# Patient Record
Sex: Female | Born: 1970
Health system: Southern US, Community
[De-identification: ages and names within clinical notes are randomized; demographics above are authoritative.]

## PROBLEM LIST (undated history)

## (undated) DIAGNOSIS — F200 Paranoid schizophrenia: Secondary | ICD-10-CM

## (undated) DIAGNOSIS — F329 Major depressive disorder, single episode, unspecified: Secondary | ICD-10-CM

## (undated) DIAGNOSIS — F32A Depression, unspecified: Secondary | ICD-10-CM

## (undated) DIAGNOSIS — D649 Anemia, unspecified: Secondary | ICD-10-CM

## (undated) DIAGNOSIS — I10 Essential (primary) hypertension: Secondary | ICD-10-CM

## (undated) DIAGNOSIS — F319 Bipolar disorder, unspecified: Secondary | ICD-10-CM

## (undated) DIAGNOSIS — F419 Anxiety disorder, unspecified: Secondary | ICD-10-CM

## (undated) HISTORY — PX: WISDOM TOOTH EXTRACTION: SHX21

## (undated) HISTORY — DX: Depression, unspecified: F32.A

## (undated) HISTORY — DX: Paranoid schizophrenia: F20.0

## (undated) HISTORY — DX: Anxiety disorder, unspecified: F41.9

## (undated) HISTORY — DX: Major depressive disorder, single episode, unspecified: F32.9

---

## 1993-01-13 HISTORY — PX: DILATION AND CURETTAGE OF UTERUS: SHX78

## 2006-08-28 ENCOUNTER — Emergency Department (HOSPITAL_COMMUNITY): Admission: EM | Admit: 2006-08-28 | Discharge: 2006-08-28 | Payer: Self-pay | Admitting: Emergency Medicine

## 2007-04-12 ENCOUNTER — Emergency Department (HOSPITAL_COMMUNITY): Admission: EM | Admit: 2007-04-12 | Discharge: 2007-04-12 | Payer: Self-pay | Admitting: Emergency Medicine

## 2009-07-08 ENCOUNTER — Emergency Department (HOSPITAL_COMMUNITY): Admission: EM | Admit: 2009-07-08 | Discharge: 2009-07-08 | Payer: Self-pay | Admitting: Emergency Medicine

## 2009-07-13 ENCOUNTER — Inpatient Hospital Stay (HOSPITAL_COMMUNITY): Admission: RE | Admit: 2009-07-13 | Discharge: 2009-07-19 | Payer: Self-pay | Admitting: Psychiatry

## 2009-07-13 ENCOUNTER — Ambulatory Visit: Payer: Self-pay | Admitting: Psychiatry

## 2010-03-31 LAB — CBC
Hemoglobin: 13.2 g/dL (ref 12.0–15.0)
MCHC: 34.6 g/dL (ref 30.0–36.0)
RBC: 4.11 MIL/uL (ref 3.87–5.11)
WBC: 5.7 10*3/uL (ref 4.0–10.5)

## 2010-03-31 LAB — DIFFERENTIAL
Basophils Relative: 0 % (ref 0–1)
Eosinophils Absolute: 0 10*3/uL (ref 0.0–0.7)
Lymphocytes Relative: 27 % (ref 12–46)
Lymphs Abs: 1.5 10*3/uL (ref 0.7–4.0)
Monocytes Absolute: 0.5 10*3/uL (ref 0.1–1.0)
Neutro Abs: 3.7 10*3/uL (ref 1.7–7.7)
Neutrophils Relative %: 64 % (ref 43–77)

## 2010-03-31 LAB — POCT PREGNANCY, URINE: Preg Test, Ur: NEGATIVE

## 2010-03-31 LAB — D-DIMER, QUANTITATIVE: D-Dimer, Quant: <0.22 ug/mL-FEU (ref 0.00–0.48)

## 2010-03-31 LAB — POCT CARDIAC MARKERS
CKMB, poc: <1 ng/mL — ABNORMAL LOW (ref 1.0–8.0)
Myoglobin, poc: 22.9 ng/mL (ref 12–200)

## 2010-03-31 LAB — VALPROIC ACID LEVEL: Valproic Acid Lvl: 76.4 ug/mL (ref 50.0–100.0)

## 2010-10-07 LAB — BASIC METABOLIC PANEL
BUN: 3 — ABNORMAL LOW
Calcium: 9.3
Glucose, Bld: 105 — ABNORMAL HIGH
Sodium: 138

## 2010-10-07 LAB — DIFFERENTIAL
Basophils Relative: 1
Lymphs Abs: 1.3
Monocytes Absolute: 0.3
Neutro Abs: 4.5

## 2010-10-07 LAB — CBC
RBC: 4.13
RDW: 13.7

## 2010-10-25 LAB — COMPREHENSIVE METABOLIC PANEL
Albumin: 3.6
CO2: 28
Calcium: 8.9
Chloride: 104
Glucose, Bld: 93
Potassium: 3.7
Sodium: 137
Total Bilirubin: 0.6
Total Protein: 6.6

## 2010-10-25 LAB — DIFFERENTIAL
Basophils Relative: 1
Eosinophils Absolute: 0
Eosinophils Relative: 1
Lymphocytes Relative: 24
Lymphs Abs: 1.6
Monocytes Absolute: 0.6
Neutrophils Relative %: 65

## 2010-10-25 LAB — POCT CARDIAC MARKERS
CKMB, poc: <1 — ABNORMAL LOW
Myoglobin, poc: 46.7
Troponin i, poc: <0.05

## 2010-10-25 LAB — URINALYSIS, ROUTINE W REFLEX MICROSCOPIC
Bilirubin Urine: NEGATIVE
Hgb urine dipstick: NEGATIVE
Ketones, ur: NEGATIVE
Nitrite: NEGATIVE
Protein, ur: NEGATIVE
Urobilinogen, UA: 0.2

## 2010-10-25 LAB — CBC
MCHC: 34.2
RBC: 3.91

## 2010-10-25 LAB — POCT PREGNANCY, URINE: Preg Test, Ur: NEGATIVE

## 2011-03-17 ENCOUNTER — Ambulatory Visit (INDEPENDENT_AMBULATORY_CARE_PROVIDER_SITE_OTHER): Payer: Medicare Other | Admitting: Internal Medicine

## 2011-03-17 VITALS — BP 137/82 | HR 83 | Temp 98.7°F | Resp 16 | Ht 65.75 in | Wt 147.6 lb

## 2011-03-17 DIAGNOSIS — Z1231 Encounter for screening mammogram for malignant neoplasm of breast: Secondary | ICD-10-CM

## 2011-03-17 DIAGNOSIS — Z202 Contact with and (suspected) exposure to infections with a predominantly sexual mode of transmission: Secondary | ICD-10-CM

## 2011-03-17 DIAGNOSIS — Z0000 Encounter for general adult medical examination without abnormal findings: Secondary | ICD-10-CM | POA: Insufficient documentation

## 2011-03-17 DIAGNOSIS — F329 Major depressive disorder, single episode, unspecified: Secondary | ICD-10-CM

## 2011-03-17 DIAGNOSIS — F32A Depression, unspecified: Secondary | ICD-10-CM

## 2011-03-17 DIAGNOSIS — Z79899 Other long term (current) drug therapy: Secondary | ICD-10-CM

## 2011-03-17 DIAGNOSIS — IMO0001 Reserved for inherently not codable concepts without codable children: Secondary | ICD-10-CM

## 2011-03-17 DIAGNOSIS — Z7251 High risk heterosexual behavior: Secondary | ICD-10-CM

## 2011-03-17 LAB — COMPREHENSIVE METABOLIC PANEL
BUN: 8 mg/dL (ref 6–23)
CO2: 24 mEq/L (ref 19–32)
Calcium: 9.4 mg/dL (ref 8.4–10.5)
Chloride: 107 mEq/L (ref 96–112)
Creat: 0.81 mg/dL (ref 0.50–1.10)
Glucose, Bld: 75 mg/dL (ref 70–99)
Total Bilirubin: 0.4 mg/dL (ref 0.3–1.2)

## 2011-03-17 LAB — POCT URINALYSIS DIPSTICK
Glucose, UA: NEGATIVE
Urobilinogen, UA: 0.2

## 2011-03-17 LAB — TSH: TSH: 2.009 u[IU]/mL (ref 0.350–4.500)

## 2011-03-17 LAB — POCT CBC
HCT, POC: 37.3 % — AB (ref 37.7–47.9)
MCHC: 31.6 g/dL — AB (ref 31.8–35.4)
RBC: 4.13 M/uL (ref 4.04–5.48)
WBC: 5.5 10*3/uL (ref 4.6–10.2)

## 2011-03-17 LAB — POCT URINE PREGNANCY: Preg Test, Ur: NEGATIVE

## 2011-03-17 MED ORDER — NORELGESTROMIN-ETH ESTRADIOL 150-35 MCG/24HR TD PTWK: 1.0000 | MEDICATED_PATCH | TRANSDERMAL | Status: DC

## 2011-03-17 NOTE — Progress Notes (Signed)
  Subjective:    Patient ID: Kristina Johnston, female    DOB: [birth year], 41 y.o.   MRN: [id number]  HPI   See scanned hx form Review of Systems See scanned hx form    Objective:   Physical Exam  Vitals reviewed. Constitutional: She is oriented to person, place, and time. She appears well-developed and well-nourished.  HENT:  Head: Normocephalic.  Nose: Nose normal.  Mouth/Throat: Oropharynx is clear and moist.  Eyes: EOM are normal. Pupils are equal, round, and reactive to light.  Neck: Normal range of motion. No tracheal deviation present. No thyromegaly present.  Cardiovascular: Normal rate, regular rhythm and normal heart sounds.   Pulmonary/Chest: Effort normal and breath sounds normal.       Normal complete breast exam.  Abdominal: Soft. Bowel sounds are normal. She exhibits no mass.  Musculoskeletal: Normal range of motion.  Lymphadenopathy:    She has no cervical adenopathy.  Neurological: She is alert and oriented to person, place, and time.  Skin: Skin is warm and dry.  Psychiatric: She has a normal mood and affect.   Results for orders placed in visit on 03/17/11  POCT CBC      Component Value Range   WBC 5.5  4.6 - 10.2 (K/uL)   Lymph, poc 1.6  0.6 - 3.4    POC LYMPH PERCENT 29.2  10 - 50 (%L)   MID (cbc) 0.3  0 - 0.9    POC MID % 5.0  0 - 12 (%M)   POC Granulocyte 3.6  2 - 6.9    Granulocyte percent 65.8  37 - 80 (%G)   RBC 4.13  4.04 - 5.48 (M/uL)   Hemoglobin 11.8 (*) 12.2 - 16.2 (g/dL)   HCT, POC 40.9 (*) 81.1 - 47.9 (%)   MCV 90.2  80 - 97 (fL)   MCH, POC 28.6  27 - 31.2 (pg)   MCHC 31.6 (*) 31.8 - 35.4 (g/dL)   RDW, POC 91.4     Platelet Count, POC 327  142 - 424 (K/uL)   MPV 9.6  0 - 99.8 (fL)  POCT URINE PREGNANCY      Component Value Range   Preg Test, Ur Negative    POCT URINALYSIS DIPSTICK      Component Value Range   Color, UA yellow     Clarity, UA clear     Glucose, UA negative     Bilirubin, UA negative     Ketones, UA negative     Spec Grav, UA 1.010     Blood, UA negative     pH, UA 6.0     Protein, UA negative     Urobilinogen, UA 0.2     Nitrite, UA negative     Leukocytes, UA Negative            Assessment & Plan:  STD exposure small possibility. Depression controlled CPE normal BC planning

## 2011-03-18 LAB — RPR

## 2011-03-18 LAB — HIV ANTIBODY (ROUTINE TESTING W REFLEX): HIV: NONREACTIVE

## 2011-03-18 LAB — GC/CHLAMYDIA PROBE AMP, URINE: Chlamydia, Swab/Urine, PCR: NEGATIVE

## 2011-03-20 ENCOUNTER — Encounter: Payer: Self-pay | Admitting: Radiology

## 2011-06-04 ENCOUNTER — Telehealth: Payer: Self-pay

## 2011-06-04 NOTE — Telephone Encounter (Signed)
PT CONCERNED THAT SHE MIGHT BE PREGNANT EVEN THOUGH SHE IS ON BC PILL BECAUSE SHE HAS LOST A LOT OF WEIGHT RECENTLY AND HORMONES ARE ALL OFF???? WANTS REASSURANCE,    BEST PHONE  [phone]

## 2011-06-05 NOTE — Telephone Encounter (Signed)
Spoke with patient to advise needs to come in and be seen. She is going to wait to see if she misses her next period (June 1-2) and take a home pregnancy test. Will come in to be seen if needed.

## 2011-06-14 ENCOUNTER — Other Ambulatory Visit: Payer: Self-pay | Admitting: Internal Medicine

## 2011-07-08 ENCOUNTER — Telehealth: Payer: Self-pay

## 2011-07-08 ENCOUNTER — Other Ambulatory Visit: Payer: Self-pay | Admitting: Internal Medicine

## 2011-07-08 NOTE — Telephone Encounter (Signed)
Pt is in office being seen  

## 2011-07-08 NOTE — Telephone Encounter (Signed)
Patient would like to know how long her ortho evra was filled for.  Was thinking it was for a year, but wanted to make sure  Please call

## 2011-09-02 ENCOUNTER — Other Ambulatory Visit: Payer: Self-pay | Admitting: Physician Assistant

## 2011-10-05 ENCOUNTER — Telehealth: Payer: Self-pay

## 2011-10-05 NOTE — Telephone Encounter (Signed)
Pt. Has questions about prescription Ortho Evra she has been taking for  Several years. Please call pt back at [phone]

## 2011-10-05 NOTE — Telephone Encounter (Signed)
Please advise the patient to come in for evaluation of these headaches.

## 2011-10-05 NOTE — Telephone Encounter (Signed)
Pt just wanted to know also if it was something she can take with her ortho evra, she does not want to stop taking bc, just needs something to help with her migraines.

## 2011-10-05 NOTE — Telephone Encounter (Signed)
Started Ortho Evra in 2007, for the last 5 years when the patient has her cycle she gets a migraine. She was wondering if these could be caused by hormones or could she be in early menopause?

## 2011-10-06 NOTE — Telephone Encounter (Signed)
Patient advised to come in for this. She states she will, she thinks they are from BCP but does not want to stop these.

## 2011-10-06 NOTE — Telephone Encounter (Signed)
Called pt no VM.

## 2011-11-07 ENCOUNTER — Telehealth: Payer: Self-pay

## 2011-11-07 NOTE — Telephone Encounter (Signed)
Pt has her CPE every March and was told by "girl that works here" that because she is not having sex, she does not need to have a pap smear but every 3 years. Kristina Johnston would like to know if this is still accurate.    [phone] best #

## 2011-11-07 NOTE — Telephone Encounter (Signed)
This call is confusing, she was here for possible STD exposure, later called with possible pregnancy. I need to speak to her about her situation, prior to advising when she may be due for her pap. Called, no answer.

## 2011-11-11 NOTE — Telephone Encounter (Signed)
Still no answer

## 2011-11-17 NOTE — Telephone Encounter (Signed)
Patient still has not returned phone calls.

## 2012-03-15 ENCOUNTER — Telehealth: Payer: Self-pay

## 2012-03-15 ENCOUNTER — Ambulatory Visit (INDEPENDENT_AMBULATORY_CARE_PROVIDER_SITE_OTHER): Payer: Medicare Other | Admitting: Emergency Medicine

## 2012-03-15 VITALS — BP 148/92 | HR 92 | Temp 99.1°F | Resp 18 | Ht 65.5 in | Wt 157.4 lb

## 2012-03-15 DIAGNOSIS — Z711 Person with feared health complaint in whom no diagnosis is made: Secondary | ICD-10-CM

## 2012-03-15 LAB — POCT CBC
Granulocyte percent: 62.5 %G (ref 37–80)
HCT, POC: 37.3 % — AB (ref 37.7–47.9)
Lymph, poc: 2.1 (ref 0.6–3.4)
MCH, POC: 27.5 pg (ref 27–31.2)
MCV: 88.3 fL (ref 80–97)
MID (cbc): 0.4 (ref 0–0.9)
POC LYMPH PERCENT: 32 %L (ref 10–50)
RDW, POC: 15.2 %
WBC: 6.6 10*3/uL (ref 4.6–10.2)

## 2012-03-15 LAB — COMPREHENSIVE METABOLIC PANEL
Albumin: 3.6 g/dL (ref 3.5–5.2)
BUN: 7 mg/dL (ref 6–23)
CO2: 25 mEq/L (ref 19–32)
Calcium: 9 mg/dL (ref 8.4–10.5)
Chloride: 106 mEq/L (ref 96–112)
Creat: 0.75 mg/dL (ref 0.50–1.10)

## 2012-03-15 LAB — POCT URINALYSIS DIPSTICK
Ketones, UA: NEGATIVE
Protein, UA: NEGATIVE
Spec Grav, UA: 1.015

## 2012-03-15 LAB — LIPID PANEL
Cholesterol: 164 mg/dL (ref 0–200)
Triglycerides: 125 mg/dL (ref <150)

## 2012-03-15 LAB — TSH: TSH: 2.489 u[IU]/mL (ref 0.350–4.500)

## 2012-03-15 LAB — HIV ANTIBODY (ROUTINE TESTING W REFLEX): HIV: NONREACTIVE

## 2012-03-15 LAB — RPR

## 2012-03-15 MED ORDER — NORELGESTROMIN-ETH ESTRADIOL 150-35 MCG/24HR TD PTWK: 1.0000 | MEDICATED_PATCH | TRANSDERMAL | Status: DC

## 2012-03-15 NOTE — Telephone Encounter (Signed)
If I recall, she got three packets of pills and 3 refills which should result in 12 packs.

## 2012-03-15 NOTE — Telephone Encounter (Signed)
Do you want to renew for 1 yr? Or does she need to come in for complete physical?

## 2012-03-15 NOTE — Addendum Note (Signed)
Addended by: Carmelina Dane on: 03/15/2012 12:47 PM   Modules accepted: Orders

## 2012-03-15 NOTE — Patient Instructions (Addendum)
Safer Sex Your caregiver wants you to have this information about the infections that can be transmitted from sexual contact and how to prevent them. The idea behind safer sex is that you can be sexually active, and at the same time reduce the risk of giving or getting a sexually transmitted disease (STD). Every person should be aware of how to prevent him or herself and his or her sex partner from getting an STD. CAUSES OF STDS STDs are transmitted by sharing body fluids, which contain viruses and bacteria. The following fluids all transmit infections during sexual intercourse and sex acts:  Semen.  Saliva.  Urine.  Blood.  Vaginal mucus. Examples of STDs include:  Chlamydia.  Gonorrhea.  Genital herpes.  Hepatitis B.  Human immunodeficiency virus or acquired immunodeficiency syndrome (HIV or AIDS).  Syphilis.  Trichomonas.  Pubic lice.  Human papillomavirus (HPV), which may include:  Genital warts.  Cervical dysplasia.  Cervical cancer (can develop with certain types of HPV). SYMPTOMS  Sexual diseases often cause few or no symptoms until they are advanced, so a person can be infected and spread the infection without knowing it. Some STDs respond to treatment very well. Others, like HIV and herpes, cannot be cured, but are treated to reduce their effects. Specific symptoms include:  Abnormal vaginal discharge.  Irritation or itching in and around the vagina, and in the pubic hair.  Pain during sexual intercourse.  Bleeding during sexual intercourse.  Pelvic or abdominal pain.  Fever.  Growths in and around the vagina.  An ulcer in or around the vagina.  Swollen glands in the groin area. DIAGNOSIS   Blood tests.  Pap test.  Culture test of abnormal vaginal discharge.  A test that applies a solution and examines the cervix with a lighted magnifying scope (colposcopy).  A test that examines the pelvis with a lighted tube, through a small incision  (laparoscopy). TREATMENT  The treatment will depend on the cause of the STD.  Antibiotic treatment by injection, oral, creams, or suppositories in the vagina.  Over-the-counter medicated shampoo, to get rid of pubic lice.  Removing or treating growths with medicine, freezing, burning (electrocautery), or surgery.  Surgery treatment for HPV of the cervix.  Supportive medicines for herpes, HIV, AIDS, and hepatitis. Being careful cannot eliminate all risk of infection, but sex can be made much safer. Safe sexual practices include body massage and gentle touching. Masturbation is safe, as long as body fluids do not contact skin that has sores or cuts. Dry kissing and oral sex on a man wearing a latex condom or on a woman wearing a female condom is also safe. Slightly less safe is intercourse while the man wears a latex condom or wet kissing. It is also safer to have one sex partner that you know is not having sex with anyone else. LENGTH OF ILLNESS An STD might be treated and cured in a week, sometimes a month, or more. And it can linger with symptoms for many years. STDs can also cause damage to the female organs. This can cause chronic pain, infertility, and recurrence of the STD, especially herpes, hepatitis, HIV, and HPV. HOME CARE INSTRUCTIONS AND PREVENTION  Alcohol and recreational drugs are often the reason given for not practicing safer sex. These substances affect your judgment. Alcohol and recreational drugs can also impair your immune system, making you more vulnerable to disease.  Do not engage in risky and dangerous sexual practices, including:  Vaginal or anal sex without a  condom.  Oral sex on a man without a condom.  Oral sex on a woman without a female condom.  Using saliva to lubricate a condom.  Any other sexual contact in which body fluids or blood from one partner contact the other partner.  You should use only latex condoms for men and water soluble lubricants.  Petroleum based lubricants or oils used to lubricate a condom will weaken the condom and increase the chance that it will break.  Think very carefully before having sex with anyone who is high risk for STDs and HIV. This includes IV drug users, people with multiple sexual partners, or people who have had an STD, or a positive hepatitis or HIV blood test.  Remember that even if your partner has had only one previous partner, their previous partner might have had multiple partners. If so, you are at high risk of being exposed to an STD. You and your sex partner should be the only sex partners with each other, with no one else involved.  A vaccine is available for hepatitis B and HPV through your caregiver or the Public Health Department. Everyone should be vaccinated with these vaccines.  Avoid risky sex practices. Sex acts that can break the skin make you more likely to get an STD. SEEK MEDICAL CARE IF:   If you think you have an STD, even if you do not have any symptoms. Contact your caregiver for evaluation and treatment, if needed.  You think or know your sex partner has acquired an STD.  You have any of the symptoms mentioned above. Document Released: 02/07/2004 Document Revised: 03/24/2011 Document Reviewed: 11/29/2008 [facility] Patient Information 2013 [place], [place].

## 2012-03-15 NOTE — Progress Notes (Signed)
[facility] [address] [place] [address] [phone]  Date:  03/15/2012   Name:  Kristina Johnston   DOB:  04-25-1970   MRN:  [id number]  PCP:  No primary provider on file.    Chief Complaint: Exposure to STD and Follow-up   History of Present Illness:  Kristina Johnston is a 42 y.o. very pleasant female patient who presents with the following:  For annual examination and renewal of her OCP prescription.  Requests check for STD.  Has been in a monogamous relationship for the past several years.  Had a pap smear last year.  Absolutely refuses to consider ever having a mammogram.  Stable on medications.  Denies other complaint or health concern today.   Patient Active Problem List  Diagnosis  . Depression  . Medical history reviewed with no changes    Past Medical History  Diagnosis Date  . Depression     History reviewed. No pertinent past surgical history.  History  Substance Use Topics  . Smoking status: Never Smoker   . Smokeless tobacco: Not on file  . Alcohol Use: No    History reviewed. No pertinent family history.  Not on File  Medication list has been reviewed and updated.  Current Outpatient Prescriptions on File Prior to Visit  Medication Sig Dispense Refill  . Asenapine Maleate (SAPHRIS) 10 MG SUBL Place under the tongue 4 (four) times daily.      . norelgestromin-ethinyl estradiol (ORTHO EVRA) 150-20 MCG/24HR transdermal patch Place 1 patch onto the skin once a week. Needs office visit for additional refills  3 each  0  . carBAMazepine (TEGRETOL) 100 MG/5ML suspension Take by mouth 4 (four) times daily.      . fluPHENAZine (PROLIXIN) 1 MG tablet Take 1 mg by mouth daily.       No current facility-administered medications on file prior to visit.    Review of Systems:  As per HPI, otherwise negative.    Physical Examination: Filed Vitals:   03/15/12 0850  BP: 148/92  Pulse: 92  Temp: 99.1 F (37.3 C)  Resp: 18    Filed Vitals:   03/15/12 0850  Height: 5' 5.5" (1.664 m)  Weight: 157 lb 6.4 oz (71.396 kg)   Body mass index is 25.79 kg/(m^2). Ideal Body Weight: Weight in (lb) to have BMI = 25: 152.2  GEN: WDWN, NAD, Non-toxic, A & O x 3 HEENT: Atraumatic, Normocephalic. Neck supple. No masses, No LAD. Ears and Nose: No external deformity. CV: RRR, No M/G/R. No JVD. No thrill. No extra heart sounds. PULM: CTA B, no wheezes, crackles, rhonchi. No retractions. No resp. distress. No accessory muscle use. ABD: S, NT, ND, +BS. No rebound. No HSM. EXTR: No c/c/e NEURO Normal gait.  PSYCH: Normally interactive. Conversant. Not depressed or anxious appearing.  Calm demeanor.    Assessment and Plan: Wellness examination Exposure to STD Labs pending.  Carmelina Dane, MD

## 2012-03-15 NOTE — Telephone Encounter (Signed)
LMOM for patient to go pick RX up for her patches (birth control) for one year

## 2012-03-15 NOTE — Telephone Encounter (Signed)
Pt seen this morning and states her birth control was not refilled for a complete year. Pt is requesting her birth control to be written for 3/14 through 3/15. Please call pt to advise

## 2012-04-16 ENCOUNTER — Telehealth: Payer: Self-pay

## 2012-04-16 NOTE — Telephone Encounter (Signed)
LMOM in regards to what CPE? No CPE visit ,only STD screenings done this year. Wanted to verify it that was okay to send the STD ov notes and results.

## 2012-04-16 NOTE — Telephone Encounter (Signed)
PT STATES SHE HAD A COMPLETE PE DONE IN MARCH AND HAVENT RECEIVED ANYTHING. PLEASE MAIL HER A COPY OF HER EXAM AND THE ADDRESS IS CORRECT PLEASE CALL [phone] WHEN IT IS MAILED SO SHE CAN LOOK OUT

## 2012-04-17 ENCOUNTER — Telehealth: Payer: Self-pay | Admitting: *Deleted

## 2012-04-17 NOTE — Telephone Encounter (Signed)
Pt would like office notes and labs from 03/15/2012 which was her physical sent to her

## 2012-04-19 NOTE — Telephone Encounter (Signed)
Mailed to home address.

## 2012-04-19 NOTE — Telephone Encounter (Signed)
Visit from march was indeed a CPE. Printed and mailed.

## 2012-09-18 ENCOUNTER — Telehealth: Payer: Self-pay

## 2012-09-18 NOTE — Telephone Encounter (Signed)
Need more information. I don't see that the patient has a diagnosis of anemia, or a previous prescription for liquid iron.

## 2012-09-18 NOTE — Telephone Encounter (Signed)
Patient is requesting liquid iron rx sent to pharmacy: [id number] - [place], [place] - [address]

## 2012-09-19 NOTE — Telephone Encounter (Signed)
Spoke with pt advised we cannot Rx iron because we didn't diagnose her with anemia. She states she will come in for CPE and recheck iron and possibly get Rx if needed.

## 2012-09-19 NOTE — Telephone Encounter (Signed)
LMOM to CB on both numbers 

## 2012-09-27 ENCOUNTER — Telehealth: Payer: Self-pay

## 2012-09-27 NOTE — Telephone Encounter (Signed)
PT STATES SHE WANT IT TO GO IN HER RECORDS FROM NOW ON SHE WANT A YEAR'S SUPPLY OF HER ORTH EVRA PATCH PLEASE CALL [phone]    [facility] [address]

## 2012-09-27 NOTE — Telephone Encounter (Signed)
Patient requesting refill on the ortho evra patch, she should have refills left. 1 yr supply Rx given in March. Called patient no answer.

## 2012-09-28 ENCOUNTER — Telehealth: Payer: Self-pay

## 2012-09-28 NOTE — Telephone Encounter (Signed)
PT STATES SOMEONE HAD CALLED HER AND SHE DOESN'T KNOW WHY. DIDN'T LEAVE A MESSAGE. PLEASE CALL [phone]

## 2012-09-28 NOTE — Telephone Encounter (Signed)
Patient called back and indicates she does not know why we called, and to leave a message, but no voice mail picks up. She has refills remaining until March.

## 2012-09-28 NOTE — Telephone Encounter (Signed)
Patient called back and was very rude stating that whenever we call her we need to leave a message and there is nothing wrong with her voicemail.

## 2012-09-28 NOTE — Telephone Encounter (Signed)
Her voice mail did not pick up, I would have left message, her rx should be at the pharmacy.

## 2013-03-24 ENCOUNTER — Other Ambulatory Visit: Payer: Self-pay | Admitting: Emergency Medicine

## 2013-04-26 ENCOUNTER — Other Ambulatory Visit: Payer: Self-pay | Admitting: Emergency Medicine

## 2013-06-23 ENCOUNTER — Telehealth: Payer: Self-pay

## 2013-06-23 NOTE — Telephone Encounter (Signed)
Patient will not be in for annual anytime soon.   She will be in within the next ten years.  She will walk in if she gets sick.  She will bring in her son for his annual.  She will go to an eye doctor if her vision changes.   She will call to be sure this message was recorded in some manner.   [phone]

## 2013-06-23 NOTE — Telephone Encounter (Signed)
Okay, noted. She has been advised she needs physical, she has declined, this is all we can do.

## 2014-04-20 ENCOUNTER — Telehealth: Payer: Self-pay | Admitting: Radiology

## 2014-04-20 NOTE — Telephone Encounter (Signed)
Received incoming call from pt. She was saying that she has had rectal bleeding since 1998. She also has "ab pain". She exercises very regularly. She sees a psychiatrist, dr [facility]. To be honest, pt was all over the place about having rectal bleeding, allergies and ab pain. I of course told her that she should be seen today or asap for rectal bleeding, as it could be something serious. Pt kept asking me if she could come in after June (i guess due to financial reasons). I informed pt that something like this shouldn't be put off that long. I told her she should come in asap. Pt expressed understanding.

## 2015-04-24 DIAGNOSIS — F250 Schizoaffective disorder, bipolar type: Secondary | ICD-10-CM | POA: Diagnosis not present

## 2015-10-30 DIAGNOSIS — F250 Schizoaffective disorder, bipolar type: Secondary | ICD-10-CM | POA: Diagnosis not present

## 2016-04-08 ENCOUNTER — Ambulatory Visit (INDEPENDENT_AMBULATORY_CARE_PROVIDER_SITE_OTHER): Payer: Medicare Other | Admitting: Student

## 2016-04-08 VITALS — BP 144/86 | HR 67 | Temp 98.7°F | Resp 18 | Ht 66.0 in | Wt 157.2 lb

## 2016-04-08 DIAGNOSIS — Z1321 Encounter for screening for nutritional disorder: Secondary | ICD-10-CM

## 2016-04-08 DIAGNOSIS — R030 Elevated blood-pressure reading, without diagnosis of hypertension: Secondary | ICD-10-CM

## 2016-04-08 DIAGNOSIS — Z13220 Encounter for screening for lipoid disorders: Secondary | ICD-10-CM

## 2016-04-08 DIAGNOSIS — Z131 Encounter for screening for diabetes mellitus: Secondary | ICD-10-CM | POA: Diagnosis not present

## 2016-04-08 DIAGNOSIS — Z114 Encounter for screening for human immunodeficiency virus [HIV]: Secondary | ICD-10-CM | POA: Diagnosis not present

## 2016-04-08 DIAGNOSIS — M545 Low back pain, unspecified: Secondary | ICD-10-CM | POA: Insufficient documentation

## 2016-04-08 DIAGNOSIS — Z124 Encounter for screening for malignant neoplasm of cervix: Secondary | ICD-10-CM | POA: Diagnosis not present

## 2016-04-08 DIAGNOSIS — Z113 Encounter for screening for infections with a predominantly sexual mode of transmission: Secondary | ICD-10-CM | POA: Diagnosis not present

## 2016-04-08 DIAGNOSIS — R252 Cramp and spasm: Secondary | ICD-10-CM | POA: Diagnosis not present

## 2016-04-08 DIAGNOSIS — N912 Amenorrhea, unspecified: Secondary | ICD-10-CM | POA: Diagnosis not present

## 2016-04-08 DIAGNOSIS — Z130 Encounter for screening for diseases of the blood and blood-forming organs and certain disorders involving the immune mechanism: Secondary | ICD-10-CM | POA: Diagnosis not present

## 2016-04-08 DIAGNOSIS — G8929 Other chronic pain: Secondary | ICD-10-CM

## 2016-04-08 DIAGNOSIS — Z0000 Encounter for general adult medical examination without abnormal findings: Secondary | ICD-10-CM | POA: Diagnosis not present

## 2016-04-08 LAB — POCT URINE PREGNANCY: PREG TEST UR: NEGATIVE

## 2016-04-08 MED ORDER — NORELGESTROMIN-ETH ESTRADIOL 150-35 MCG/24HR TD PTWK: 1.0000 | MEDICATED_PATCH | TRANSDERMAL | 12 refills | Status: DC

## 2016-04-08 NOTE — Assessment & Plan Note (Signed)
Aleve OTC, stretching HEP given.  Follow up 1 month.  Consider x-rays since has been 1 year.

## 2016-04-08 NOTE — Progress Notes (Signed)
Subjective:    Patient ID: Kristina Johnston, female    DOB: August 15, 1970, 46 y.o.   MRN: [id number]  HPI Presents for annual exam, is doing well.  Due for pap, has not had one since 2012.  She is otherwise doing well.  No vaginal bleeding, dysuria or discharge.  Sexually active with 1 partner, but would like to be screened for STD's.  She has been off birth control 3 years.  LMP 03/25/16. No breast pain, discharge, skin changes.  She has a history of bipolar disorder, sees a psychiatrist for management.  She is on medical disability.    No smoking, drinking, recreational drugs.  Having LBP that has been ongoing for 1 year.  No radicular symptoms, numbness or tingling.  Does get occasional leg cramps in right leg.  Does try to work out.  Does not do core strengthening.    PMHx - Updated and reviewed.  Contributory factors include: Bipolar disorder PSHx - Updated and reviewed.  Contributory factors include:  Negative FHx - Updated and reviewed.  Contributory factors include:  Negative Social Hx - Updated and reviewed. Contributory factors include: Negative Medications - reviewed    Review of Systems  Constitutional: Negative for chills, fatigue, fever and unexpected weight change.  HENT: Negative for congestion and rhinorrhea.   Respiratory: Negative for cough, chest tightness and shortness of breath.   Cardiovascular: Negative for chest pain, palpitations and leg swelling.  Gastrointestinal: Negative for abdominal pain, nausea and vomiting.  Genitourinary: Negative for difficulty urinating, dyspareunia, dysuria, flank pain, frequency, hematuria, menstrual problem, pelvic pain, urgency, vaginal bleeding, vaginal discharge and vaginal pain.  Musculoskeletal: Positive for back pain. Negative for arthralgias and joint swelling.  Skin: Negative for rash and wound.  Neurological: Negative for dizziness and light-headedness.  Psychiatric/Behavioral: Negative for agitation, confusion and  self-injury.  All other systems reviewed and are negative.      Objective:   Physical Exam  Constitutional: She is oriented to person, place, and time. She appears well-developed and well-nourished. No distress.  HENT:  Head: Normocephalic and atraumatic.  Right Ear: External ear normal.  Left Ear: External ear normal.  Eyes: Conjunctivae are normal. Pupils are equal, round, and reactive to light. Right eye exhibits no discharge. Left eye exhibits no discharge.  Neck: Normal range of motion. Neck supple.  Cardiovascular: Normal rate, regular rhythm, normal heart sounds and intact distal pulses.  Exam reveals no gallop and no friction rub.   No murmur heard. Pulmonary/Chest: Effort normal and breath sounds normal. No respiratory distress. She has no wheezes. She has no rales. Right breast exhibits no inverted nipple, no mass, no nipple discharge, no skin change and no tenderness. Left breast exhibits no inverted nipple, no mass, no nipple discharge, no skin change and no tenderness. Breasts are symmetrical.  Genitourinary: Vagina normal. No vaginal discharge found.  Musculoskeletal: Normal range of motion. She exhibits no edema.  TTP over left paraspinal muscles in lower thorax/upper lumbar  Lymphadenopathy:    She has no cervical adenopathy.  Neurological: She is alert and oriented to person, place, and time.  Skin: Skin is warm. No rash noted. She is not diaphoretic. No erythema.  Psychiatric: She has a normal mood and affect. Her behavior is normal. Judgment and thought content normal.  Nursing note and vitals reviewed.  BP (!) 144/86   Pulse 67   Temp 98.7 F (37.1 C) (Oral)   Resp 18   Ht 5\' 6"  (1.676 m)   Wt 157  lb 3.2 oz (71.3 kg)   LMP 03/25/2016   SpO2 100%   BMI 25.37 kg/m         Assessment & Plan:  Annual physical exam Pap smear done, refused mammogram, CBE normal.  Screen for STD's.    Elevated blood-pressure reading without diagnosis of hypertension CMP  done, recheck 1 month.  Low back pain Aleve OTC, stretching HEP given.  Follow up 1 month.  Consider x-rays since has been 1 year.  Signed,  Balinda Quails, DO [facility] 4:23 PM

## 2016-04-08 NOTE — Assessment & Plan Note (Signed)
Pap smear done, refused mammogram, CBE normal.  Screen for STD's.

## 2016-04-08 NOTE — Assessment & Plan Note (Signed)
CMP done, recheck 1 month.

## 2016-04-08 NOTE — Patient Instructions (Addendum)
  Will call if abnormal results, otherwise will get letter.  Please come back in 4 weeks to get BP check and recheck low back   IF you received an x-ray today, you will receive an invoice from [facility]. Please contact [facility] at [phone] with questions or concerns regarding your invoice.   IF you received labwork today, you will receive an invoice from [facility]. Please contact [facility] at [phone] with questions or concerns regarding your invoice.   Our billing staff will not be able to assist you with questions regarding bills from these companies.  You will be contacted with the lab results as soon as they are available. The fastest way to get your results is to activate your My Chart account. Instructions are located on the last page of this paperwork. If you have not heard from [place] regarding the results in 2 weeks, please contact this office.

## 2016-04-09 LAB — COMPREHENSIVE METABOLIC PANEL
A/G RATIO: 1.6 (ref 1.2–2.2)
ALT: 14 IU/L (ref 0–32)
AST: 15 IU/L (ref 0–40)
Albumin: 4.6 g/dL (ref 3.5–5.5)
Alkaline Phosphatase: 52 IU/L (ref 39–117)
BUN/Creatinine Ratio: 14 (ref 9–23)
BUN: 11 mg/dL (ref 6–24)
Bilirubin Total: 0.5 mg/dL (ref 0.0–1.2)
CALCIUM: 9.4 mg/dL (ref 8.7–10.2)
CHLORIDE: 103 mmol/L (ref 96–106)
CO2: 23 mmol/L (ref 18–29)
Creatinine, Ser: 0.79 mg/dL (ref 0.57–1.00)
GFR calc Af Amer: 103 mL/min/{1.73_m2} (ref >59)
GFR, EST NON AFRICAN AMERICAN: 89 mL/min/{1.73_m2} (ref >59)
Globulin, Total: 2.8 g/dL (ref 1.5–4.5)
Glucose: 82 mg/dL (ref 65–99)
POTASSIUM: 4 mmol/L (ref 3.5–5.2)
Sodium: 142 mmol/L (ref 134–144)
Total Protein: 7.4 g/dL (ref 6.0–8.5)

## 2016-04-09 LAB — HIV ANTIBODY (ROUTINE TESTING W REFLEX): HIV SCREEN 4TH GENERATION: NONREACTIVE

## 2016-04-09 LAB — RPR: RPR: NONREACTIVE

## 2016-04-09 LAB — CBC WITH DIFFERENTIAL/PLATELET
BASOS ABS: 0 10*3/uL (ref 0.0–0.2)
Basos: 1 %
EOS (ABSOLUTE): 0 10*3/uL (ref 0.0–0.4)
Eos: 1 %
HEMATOCRIT: 39.7 % (ref 34.0–46.6)
Hemoglobin: 13.2 g/dL (ref 11.1–15.9)
IMMATURE GRANS (ABS): 0 10*3/uL (ref 0.0–0.1)
IMMATURE GRANULOCYTES: 0 %
LYMPHS: 35 %
Lymphocytes Absolute: 1.3 10*3/uL (ref 0.7–3.1)
MCH: 29.7 pg (ref 26.6–33.0)
MCHC: 33.2 g/dL (ref 31.5–35.7)
MCV: 89 fL (ref 79–97)
Monocytes Absolute: 0.3 10*3/uL (ref 0.1–0.9)
Monocytes: 8 %
NEUTROS PCT: 55 %
Neutrophils Absolute: 2 10*3/uL (ref 1.4–7.0)
Platelets: 259 10*3/uL (ref 150–379)
RBC: 4.45 x10E6/uL (ref 3.77–5.28)
RDW: 14.2 % (ref 12.3–15.4)
WBC: 3.6 10*3/uL (ref 3.4–10.8)

## 2016-04-10 LAB — PAP IG AND HPV HIGH-RISK
HPV, HIGH-RISK: NEGATIVE
PAP SMEAR COMMENT: 0

## 2016-04-10 LAB — GC/CHLAMYDIA PROBE AMP
CHLAMYDIA, DNA PROBE: NEGATIVE
Neisseria gonorrhoeae by PCR: NEGATIVE

## 2016-04-16 ENCOUNTER — Encounter: Payer: Self-pay | Admitting: Student

## 2016-08-27 DIAGNOSIS — F250 Schizoaffective disorder, bipolar type: Secondary | ICD-10-CM | POA: Diagnosis not present

## 2017-02-17 DIAGNOSIS — F209 Schizophrenia, unspecified: Secondary | ICD-10-CM | POA: Diagnosis not present

## 2017-04-06 ENCOUNTER — Other Ambulatory Visit: Payer: Self-pay | Admitting: *Deleted

## 2017-04-06 ENCOUNTER — Telehealth: Payer: Self-pay | Admitting: Family Medicine

## 2017-04-06 MED ORDER — NORELGESTROMIN-ETH ESTRADIOL 150-35 MCG/24HR TD PTWK: 1.0000 | MEDICATED_PATCH | TRANSDERMAL | 0 refills | Status: DC

## 2017-04-06 NOTE — Telephone Encounter (Signed)
Copied from [place] [id number]. Topic: Quick Communication - Rx Refill/Question >> Apr 06, 2017  9:18 AM Synthia Innocent wrote: Medication: norelgestromin-ethinyl estradiol (ORTHO EVRA) 150-35 MCG/24HR transdermal patch  Has the patient contacted their pharmacy? Yes.   (Agent: If no, request that the patient contact the pharmacy for the refill.) Preferred Pharmacy (with phone number or street name): [facility] on [address] Agent: Please be advised that RX refills may take up to 3 business days. We ask that you follow-up with your pharmacy.

## 2017-04-06 NOTE — Telephone Encounter (Signed)
Pt requesting refill of norelgestromin-ethinyl estradiol (ORTHO EVRA) 150-35 MCG/24HR transdermal patch.  Last PAP 04/08/16  Pt has appt 04/08/17 with Dr. Nolon Rod  OK to refill?

## 2017-04-08 ENCOUNTER — Other Ambulatory Visit: Payer: Self-pay

## 2017-04-08 ENCOUNTER — Ambulatory Visit (INDEPENDENT_AMBULATORY_CARE_PROVIDER_SITE_OTHER): Payer: Medicare Other | Admitting: Family Medicine

## 2017-04-08 ENCOUNTER — Telehealth: Payer: Self-pay | Admitting: Family Medicine

## 2017-04-08 ENCOUNTER — Encounter: Payer: Self-pay | Admitting: Family Medicine

## 2017-04-08 VITALS — BP 138/84 | HR 60 | Temp 98.9°F | Resp 17 | Ht 66.0 in | Wt 149.8 lb

## 2017-04-08 DIAGNOSIS — Z1231 Encounter for screening mammogram for malignant neoplasm of breast: Secondary | ICD-10-CM

## 2017-04-08 DIAGNOSIS — Z1329 Encounter for screening for other suspected endocrine disorder: Secondary | ICD-10-CM | POA: Diagnosis not present

## 2017-04-08 DIAGNOSIS — R252 Cramp and spasm: Secondary | ICD-10-CM

## 2017-04-08 DIAGNOSIS — Z803 Family history of malignant neoplasm of breast: Secondary | ICD-10-CM | POA: Diagnosis not present

## 2017-04-08 DIAGNOSIS — Z3041 Encounter for surveillance of contraceptive pills: Secondary | ICD-10-CM | POA: Diagnosis not present

## 2017-04-08 DIAGNOSIS — Z5181 Encounter for therapeutic drug level monitoring: Secondary | ICD-10-CM

## 2017-04-08 DIAGNOSIS — F3176 Bipolar disorder, in full remission, most recent episode depressed: Secondary | ICD-10-CM

## 2017-04-08 DIAGNOSIS — Z1239 Encounter for other screening for malignant neoplasm of breast: Secondary | ICD-10-CM

## 2017-04-08 DIAGNOSIS — Z13220 Encounter for screening for lipoid disorders: Secondary | ICD-10-CM | POA: Diagnosis not present

## 2017-04-08 DIAGNOSIS — F319 Bipolar disorder, unspecified: Secondary | ICD-10-CM | POA: Insufficient documentation

## 2017-04-08 DIAGNOSIS — Z131 Encounter for screening for diabetes mellitus: Secondary | ICD-10-CM

## 2017-04-08 DIAGNOSIS — Z0000 Encounter for general adult medical examination without abnormal findings: Secondary | ICD-10-CM | POA: Diagnosis not present

## 2017-04-08 MED ORDER — NORELGESTROMIN-ETH ESTRADIOL 150-35 MCG/24HR TD PTWK: 1.0000 | MEDICATED_PATCH | TRANSDERMAL | 11 refills | Status: DC

## 2017-04-08 NOTE — Progress Notes (Signed)
Chief Complaint  Patient presents with  . Annual Exam    complete physical w/o pap ( last pap 04/08/2016)    Subjective:  Kristina Johnston is a 47 y.o. female here for a health maintenance visit.  Patient is established pt  Patient Active Problem List   Diagnosis Date Noted  . Bipolar disorder ([place]) 04/08/2017  . Screen for STD (sexually transmitted disease) 04/08/2016  . Pap smear for cervical cancer screening 04/08/2016  . Screening for deficiency anemia 04/08/2016  . Screening for HIV (human immunodeficiency virus) 04/08/2016  . Elevated blood-pressure reading without diagnosis of hypertension 04/08/2016  . Low back pain 04/08/2016  . Depression 03/17/2011  . Annual physical exam 03/17/2011    Past Medical History:  Diagnosis Date  . Anxiety   . Depression     History reviewed. No pertinent surgical history.   Outpatient Medications Prior to Visit  Medication Sig Dispense Refill  . haloperidol (HALDOL) 0.5 MG tablet Take 0.5 mg by mouth 1 day or 1 dose.    . lithium 300 MG tablet Take 300 mg by mouth 3 (three) times daily. Elixir formula    . Valproate Sodium (DEPAKENE) 250 MG/5ML SOLN solution Take 1 mL by mouth at bedtime as needed.    . norelgestromin-ethinyl estradiol (ORTHO EVRA) 150-35 MCG/24HR transdermal patch Place 1 patch onto the skin once a week. 3 patch 0   No facility-administered medications prior to visit.     No Known Allergies   Family History  Problem Relation Age of Onset  . Heart disease Mother   . Hyperlipidemia Mother   . Mental illness Mother   . Diabetes Father   . Hypertension Sister   . Hyperlipidemia Brother   . Hypertension Brother   . Cancer Maternal Grandmother   . Cancer Maternal Grandfather   . Hyperlipidemia Maternal Grandfather   . Hypertension Maternal Grandfather   . Cancer Paternal Grandmother 20       breast cancer  . Hyperlipidemia Paternal Grandfather   . Hypertension Paternal Grandfather   . Cancer Maternal Aunt  40       breast cancer     Health Habits: Dental Exam: up to date Eye Exam: up to date Exercise: 3 times/week on average Current exercise activities: walking/running Diet: balanced   Social History   Socioeconomic History  . Marital status: Divorced    Spouse name: Not on file  . Number of children: Not on file  . Years of education: Not on file  . Highest education level: Not on file  Occupational History  . Not on file  Social Needs  . Financial resource strain: Not hard at all  . Food insecurity:    Worry: Never true    Inability: Never true  . Transportation needs:    Medical: No    Non-medical: No  Tobacco Use  . Smoking status: Never Smoker  . Smokeless tobacco: Never Used  Substance and Sexual Activity  . Alcohol use: No  . Drug use: No  . Sexual activity: Yes  Lifestyle  . Physical activity:    Days per week: 3 days    Minutes per session: 30 min  . Stress: Rather much  Relationships  . Social connections:    Talks on phone: Three times a week    Gets together: Three times a week    Attends religious service: Never    Active member of club or organization: No    Attends meetings of clubs or organizations:  Never    Relationship status: Divorced  . Intimate partner violence:    Fear of current or ex partner: No    Emotionally abused: No    Physically abused: No    Forced sexual activity: No  Other Topics Concern  . Not on file  Social History Narrative  . Not on file   Social History   Substance and Sexual Activity  Alcohol Use No   Social History   Tobacco Use  Smoking Status Never Smoker  Smokeless Tobacco Never Used   Social History   Substance and Sexual Activity  Drug Use No    GYN: Sexual Health Menstrual status: regular menses LMP: Patient's last menstrual period was 03/20/2017. Last pap smear: see HM section History of abnormal pap smears:  Sexually active: with female partner Current contraception: patch  Health  Maintenance: See under health Maintenance activity for review of completion dates as well.  There is no immunization history on file for this patient.    Depression Screen-PHQ2/9 Depression screen [facility] 2/9 04/08/2017 04/08/2016  Decreased Interest 0 2  Down, Depressed, Hopeless 0 2  PHQ - 2 Score 0 4  Altered sleeping - 3  Tired, decreased energy - 3  Change in appetite - 1  Feeling bad or failure about yourself  - 3  Trouble concentrating - 3  Moving slowly or fidgety/restless - 3  Suicidal thoughts - 0  PHQ-9 Score - 20       Depression Severity and Treatment Recommendations:  0-4= None  5-9= Mild / Treatment: Support, educate to call if worse; return in one month  10-14= Moderate / Treatment: Support, watchful waiting; Antidepressant or Psycotherapy  15-19= Moderately severe / Treatment: Antidepressant OR Psychotherapy  >= 20 = Major depression, severe / Antidepressant AND Psychotherapy    Review of Systems   Review of Systems  HENT: Negative for congestion, sinus pain and sore throat.   Eyes: Negative for blurred vision and double vision.  Respiratory: Negative for cough, shortness of breath and wheezing.   Cardiovascular: Negative for chest pain, palpitations and orthopnea.  Gastrointestinal: Positive for nausea. Negative for abdominal pain, blood in stool, constipation, diarrhea, melena and vomiting.  Genitourinary: Negative for dysuria, frequency, hematuria and urgency.  Skin: Negative for itching and rash.  Neurological: Negative for dizziness.  Psychiatric/Behavioral: Positive for depression. Negative for suicidal ideas. The patient is nervous/anxious and has insomnia.     See HPI for ROS as well.    Objective:   Vitals:   04/08/17 0950  BP: 138/84  Pulse: 60  Resp: 17  Temp: 98.9 F (37.2 C)  TempSrc: Oral  SpO2: 97%  Weight: 149 lb 12.8 oz (67.9 kg)  Height: 5\' 6"  (1.676 m)   Body mass index is 24.18 kg/m.  Physical Exam  BP 138/84 (BP  Location: Right Arm, Patient Position: Sitting, Cuff Size: Normal)   Pulse 60   Temp 98.9 F (37.2 C) (Oral)   Resp 17   Ht 5\' 6"  (1.676 m)   Wt 149 lb 12.8 oz (67.9 kg)   LMP 03/20/2017   SpO2 97%   BMI 24.18 kg/m   General Appearance:    Alert, cooperative, no distress, appears stated age  Head:    Normocephalic, without obvious abnormality, atraumatic  Eyes:    PERRL, conjunctiva/corneas clear, EOM's intact, fundi    benign, both eyes  Ears:    Normal TM's and external ear canals, both ears  Nose:   Nares normal, septum midline,  mucosa normal, no drainage    or sinus tenderness  Throat:   Lips, mucosa, and tongue normal; teeth and gums normal  Neck:   Supple, symmetrical, trachea midline, no adenopathy;    thyroid:  no enlargement/tenderness/nodules; no carotid   bruit or JVD  Back:     Symmetric, no curvature, ROM normal, no CVA tenderness  Lungs:     Clear to auscultation bilaterally, respirations unlabored  Chest Wall:    No tenderness or deformity   Heart:    Regular rate and rhythm, S1 and S2 normal, no murmur, rub   or gallop  Breast Exam:    Chaperone present. No tenderness, masses, or nipple abnormality  Abdomen:     Soft, non-tender, bowel sounds active all four quadrants,    no masses, no organomegaly  Genitalia:    Deferred pap utd  Extremities:   Extremities normal, atraumatic, no cyanosis or edema  Pulses:   2+ and symmetric all extremities  Skin:   Skin color, texture, turgor normal, no rashes or lesions  Lymph nodes:   Cervical, supraclavicular, and axillary nodes normal  Neurologic:   CNII-XII intact, normal strength, sensation and reflexes    throughout     Assessment/Plan:   Patient was seen for a health maintenance exam.  Counseled the patient on health maintenance issues. Reviewed her health mainteance schedule and ordered appropriate tests (see orders.) Counseled on regular exercise and weight management. Recommend regular eye exams and dental  cleaning.   The following issues were addressed today for health maintenance:   Spruha was seen today for annual exam.  Diagnoses and all orders for this visit:  Encounter for health maintenance examination in adult- discussed age appropriate  Screening, lipid -     Lipid panel -     Comprehensive metabolic panel  Encounter for medication monitoring -     TSH -     CBC  Leg cramps -     Comprehensive metabolic panel  Encounter for surveillance of contraceptive pills- refilled meds today, vitals and mood stable -     norelgestromin-ethinyl estradiol (ORTHO EVRA) 150-35 MCG/24HR transdermal patch; Place 1 patch onto the skin once a week.  Screening for diabetes mellitus- discussed diabetes screening -     Comprehensive metabolic panel  Screening for thyroid disorder -     TSH  Bipolar disorder, in full remission, most recent episode depressed ([place])- stable on current meds -     TSH  Family history of breast cancer Screening for breast cancer  Performed breast exam Advised mammogram  -     MM Digital Screening; Future    Return in about 1 year (around 04/09/2018).    Body mass index is 24.18 kg/m.:  Discussed the patient's BMI with patient. The BMI body mass index is 24.18 kg/m.     No future appointments.  Patient Instructions       IF you received an x-ray today, you will receive an invoice from [facility]. Please contact [facility] at [phone] with questions or concerns regarding your invoice.   IF you received labwork today, you will receive an invoice from [place]. Please contact [facility] at [phone] with questions or concerns regarding your invoice.   Our billing staff will not be able to assist you with questions regarding bills from these companies.  You will be contacted with the lab results as soon as they are available. The fastest way to get your results is to activate your My Chart  account. Instructions are  located on the last page of this paperwork. If you have not heard from [place] regarding the results in 2 weeks, please contact this office.     Breast Self-Awareness Breast self-awareness means being familiar with how your breasts look and feel. It involves checking your breasts regularly and reporting any changes to your health care provider. Practicing breast self-awareness is important. A change in your breasts can be a sign of a serious medical problem. Being familiar with how your breasts look and feel allows you to find any problems early, when treatment is more likely to be successful. All women should practice breast self-awareness, including women who have had breast implants. How to do a breast self-exam One way to learn what is normal for your breasts and whether your breasts are changing is to do a breast self-exam. To do a breast self-exam: Look for Changes  1. Remove all the clothing above your waist. 2. Stand in front of a mirror in a room with good lighting. 3. Put your hands on your hips. 4. Push your hands firmly downward. 5. Compare your breasts in the mirror. Look for differences between them (asymmetry), such as: ? Differences in shape. ? Differences in size. ? Puckers, dips, and bumps in one breast and not the other. 6. Look at each breast for changes in your skin, such as: ? Redness. ? Scaly areas. 7. Look for changes in your nipples, such as: ? Discharge. ? Bleeding. ? Dimpling. ? Redness. ? A change in position. Feel for Changes  Carefully feel your breasts for lumps and changes. It is best to do this while lying on your back on the floor and again while sitting or standing in the shower or tub with soapy water on your skin. Feel each breast in the following way:  Place the arm on the side of the breast you are examining above your head.  Feel your breast with the other hand.  Start in the nipple area and make  inch (2 cm) overlapping circles to feel your  breast. Use the pads of your three middle fingers to do this. Apply light pressure, then medium pressure, then firm pressure. The light pressure will allow you to feel the tissue closest to the skin. The medium pressure will allow you to feel the tissue that is a little deeper. The firm pressure will allow you to feel the tissue close to the ribs.  Continue the overlapping circles, moving downward over the breast until you feel your ribs below your breast.  Move one finger-width toward the center of the body. Continue to use the  inch (2 cm) overlapping circles to feel your breast as you move slowly up toward your collarbone.  Continue the up and down exam using all three pressures until you reach your armpit.  Write Down What You Find  Write down what is normal for each breast and any changes that you find. Keep a written record with breast changes or normal findings for each breast. By writing this information down, you do not need to depend only on memory for size, tenderness, or location. Write down where you are in your menstrual cycle, if you are still menstruating. If you are having trouble noticing differences in your breasts, do not get discouraged. With time you will become more familiar with the variations in your breasts and more comfortable with the exam. How often should I examine my breasts? Examine your breasts every month. If you are  breastfeeding, the best time to examine your breasts is after a feeding or after using a breast pump. If you menstruate, the best time to examine your breasts is 5-7 days after your period is over. During your period, your breasts are lumpier, and it may be more difficult to notice changes. When should I see my health care provider? See your health care provider if you notice:  A change in shape or size of your breasts or nipples.  A change in the skin of your breast or nipples, such as a reddened or scaly area.  Unusual discharge from your  nipples.  A lump or thick area that was not there before.  Pain in your breasts.  Anything that concerns you.  This information is not intended to replace advice given to you by your health care provider. Make sure you discuss any questions you have with your health care provider. Document Released: 12/30/2004 Document Revised: 06/07/2015 Document Reviewed: 11/19/2014 Elsevier Interactive Patient Education  [employer].

## 2017-04-08 NOTE — Patient Instructions (Addendum)
   IF you received an x-ray today, you will receive an invoice from [facility]. Please contact [facility] at [phone] with questions or concerns regarding your invoice.   IF you received labwork today, you will receive an invoice from [facility]. Please contact [facility] at [phone] with questions or concerns regarding your invoice.   Our billing staff will not be able to assist you with questions regarding bills from these companies.  You will be contacted with the lab results as soon as they are available. The fastest way to get your results is to activate your My Chart account. Instructions are located on the last page of this paperwork. If you have not heard from us regarding the results in 2 weeks, please contact this office.     Breast Self-Awareness Breast self-awareness means being familiar with how your breasts look and feel. It involves checking your breasts regularly and reporting any changes to your health care provider. Practicing breast self-awareness is important. A change in your breasts can be a sign of a serious medical problem. Being familiar with how your breasts look and feel allows you to find any problems early, when treatment is more likely to be successful. All women should practice breast self-awareness, including women who have had breast implants. How to do a breast self-exam One way to learn what is normal for your breasts and whether your breasts are changing is to do a breast self-exam. To do a breast self-exam: Look for Changes  1. Remove all the clothing above your waist. 2. Stand in front of a mirror in a room with good lighting. 3. Put your hands on your hips. 4. Push your hands firmly downward. 5. Compare your breasts in the mirror. Look for differences between them (asymmetry), such as: ? Differences in shape. ? Differences in size. ? Puckers, dips, and bumps in one breast and not the other. 6. Look at each breast for changes  in your skin, such as: ? Redness. ? Scaly areas. 7. Look for changes in your nipples, such as: ? Discharge. ? Bleeding. ? Dimpling. ? Redness. ? A change in position. Feel for Changes  Carefully feel your breasts for lumps and changes. It is best to do this while lying on your back on the floor and again while sitting or standing in the shower or tub with soapy water on your skin. Feel each breast in the following way:  Place the arm on the side of the breast you are examining above your head.  Feel your breast with the other hand.  Start in the nipple area and make  inch (2 cm) overlapping circles to feel your breast. Use the pads of your three middle fingers to do this. Apply light pressure, then medium pressure, then firm pressure. The light pressure will allow you to feel the tissue closest to the skin. The medium pressure will allow you to feel the tissue that is a little deeper. The firm pressure will allow you to feel the tissue close to the ribs.  Continue the overlapping circles, moving downward over the breast until you feel your ribs below your breast.  Move one finger-width toward the center of the body. Continue to use the  inch (2 cm) overlapping circles to feel your breast as you move slowly up toward your collarbone.  Continue the up and down exam using all three pressures until you reach your armpit.  Write Down What You Find  Write down what is normal for   each breast and any changes that you find. Keep a written record with breast changes or normal findings for each breast. By writing this information down, you do not need to depend only on memory for size, tenderness, or location. Write down where you are in your menstrual cycle, if you are still menstruating. If you are having trouble noticing differences in your breasts, do not get discouraged. With time you will become more familiar with the variations in your breasts and more comfortable with the exam. How often  should I examine my breasts? Examine your breasts every month. If you are breastfeeding, the best time to examine your breasts is after a feeding or after using a breast pump. If you menstruate, the best time to examine your breasts is 5-7 days after your period is over. During your period, your breasts are lumpier, and it may be more difficult to notice changes. When should I see my health care provider? See your health care provider if you notice:  A change in shape or size of your breasts or nipples.  A change in the skin of your breast or nipples, such as a reddened or scaly area.  Unusual discharge from your nipples.  A lump or thick area that was not there before.  Pain in your breasts.  Anything that concerns you.  This information is not intended to replace advice given to you by your health care provider. Make sure you discuss any questions you have with your health care provider. Document Released: 12/30/2004 Document Revised: 06/07/2015 Document Reviewed: 11/19/2014 Elsevier Interactive Patient Education  2018 [employer] Inc[employer]  

## 2017-04-08 NOTE — Telephone Encounter (Signed)
Copied from [place] [id number]. Topic: Quick Communication - See Telephone Encounter >> Apr 08, 2017  4:01 PM Cleaster Corin, NT wrote: CRM for notification. See Telephone encounter for: 04/08/17.  Pt. Calling wanting to have a b/p med. That is in liquid form or chewable tablet pt. Can be reached at [phone]  [id number] [address] [place], [place] AT [place] [address] [place] [address] Phone: [phone] Fax: [phone]

## 2017-04-09 ENCOUNTER — Encounter: Payer: Self-pay | Admitting: Family Medicine

## 2017-04-09 ENCOUNTER — Telehealth: Payer: Self-pay

## 2017-04-09 LAB — COMPREHENSIVE METABOLIC PANEL
A/G RATIO: 1.5 (ref 1.2–2.2)
ALT: 10 IU/L (ref 0–32)
AST: 14 IU/L (ref 0–40)
Albumin: 3.8 g/dL (ref 3.5–5.5)
Alkaline Phosphatase: 44 IU/L (ref 39–117)
BUN/Creatinine Ratio: 15 (ref 9–23)
BUN: 11 mg/dL (ref 6–24)
Bilirubin Total: 0.3 mg/dL (ref 0.0–1.2)
CALCIUM: 9.5 mg/dL (ref 8.7–10.2)
CHLORIDE: 103 mmol/L (ref 96–106)
CO2: 23 mmol/L (ref 20–29)
Creatinine, Ser: 0.75 mg/dL (ref 0.57–1.00)
GFR calc Af Amer: 109 mL/min/{1.73_m2} (ref >59)
GFR, EST NON AFRICAN AMERICAN: 95 mL/min/{1.73_m2} (ref >59)
GLOBULIN, TOTAL: 2.5 g/dL (ref 1.5–4.5)
Glucose: 78 mg/dL (ref 65–99)
POTASSIUM: 4.4 mmol/L (ref 3.5–5.2)
SODIUM: 140 mmol/L (ref 134–144)
Total Protein: 6.3 g/dL (ref 6.0–8.5)

## 2017-04-09 LAB — CBC
Hematocrit: 36 % (ref 34.0–46.6)
Hemoglobin: 12.4 g/dL (ref 11.1–15.9)
MCH: 30.6 pg (ref 26.6–33.0)
MCHC: 34.4 g/dL (ref 31.5–35.7)
MCV: 89 fL (ref 79–97)
PLATELETS: 256 10*3/uL (ref 150–379)
RBC: 4.05 x10E6/uL (ref 3.77–5.28)
RDW: 14.1 % (ref 12.3–15.4)
WBC: 4.1 10*3/uL (ref 3.4–10.8)

## 2017-04-09 LAB — LIPID PANEL
CHOLESTEROL TOTAL: 163 mg/dL (ref 100–199)
Chol/HDL Ratio: 2.4 ratio (ref 0.0–4.4)
HDL: 68 mg/dL (ref >39)
LDL CALC: 70 mg/dL (ref 0–99)
Triglycerides: 127 mg/dL (ref 0–149)
VLDL CHOLESTEROL CAL: 25 mg/dL (ref 5–40)

## 2017-04-09 LAB — TSH: TSH: 2.61 u[IU]/mL (ref 0.450–4.500)

## 2017-04-09 NOTE — Telephone Encounter (Signed)
I did not prescribe her any blood pressure medications. She does not have hypertension. She should just continue to exercise but no need for medications right now.

## 2017-04-09 NOTE — Telephone Encounter (Signed)
Called to f/u on request. She said if there is no possible liquid or no chewable tablet please send the smallest possible pill as she has a difficult time swallowing medicines. Pt requesting lowest possible dose of BP med.

## 2017-04-09 NOTE — Telephone Encounter (Signed)
Detailed message left

## 2017-04-09 NOTE — Telephone Encounter (Signed)
Pt lab results sent via mail to home address on file. Dgaddy, CMA

## 2017-05-23 ENCOUNTER — Telehealth: Payer: Self-pay | Admitting: General Practice

## 2017-05-23 NOTE — Telephone Encounter (Signed)
Pt. Calling the practice to report having missed a period. Pt. Is concerned that this may be an effect of her birth control which she refers to as a Zolaine patch, confirmed it is norelgestromin-ethinyl estradiol. Pt. Wishes to be advised about the matter  Please Advise  Best number [phone]

## 2017-05-23 NOTE — Telephone Encounter (Signed)
Agreed.  Thank you

## 2017-05-23 NOTE — Telephone Encounter (Signed)
Phone call to patient. She states she has been on Xulane patch since 03/2017. States her periods have always been regular, has never missed a period.   Uses Xulane patch as prescribed, never misses a dose.   She is one day late for her period. Expected menses to start last night.    Patient is 47 years old. Denies hot flashes, mood swings - states she has problems sleeping she is attributing to stress. Her son is going to [place] for 5 weeks for study abroad. States she has also been eating less lately.   She would like to know if she can take a pregnancy test.  Discussed with patient changes in menses that can occur with changes in stress, nutrition, activity.   Patient advised to take all medications as prescribed. She states she will take a pregnancy test and call for appointment if it is positive, will call to make an appointment in the next month if she does not have her period.

## 2017-06-03 ENCOUNTER — Ambulatory Visit: Payer: Self-pay | Admitting: *Deleted

## 2017-06-03 NOTE — Telephone Encounter (Signed)
Patient is concerned due to her fluctuating BP- she is concerned about changes she is experiencing- she is having little to no cycle with her last period- she uses the patch. She was at the dentist and she had elevated reading. She is going to monitor her BP and bring her reading to her appointment . Patient scheduled per her request on Saturday-due to her schedule. She will call back if her BP elevates to what it was at the dentist- or her bottom number elevates to 100- so her appointment can be moved up.

## 2017-06-03 NOTE — Telephone Encounter (Signed)
  Reason for Disposition . [6] Systolic BP  >= 825 OR Diastolic >= 90 AND [7] not taking BP medications  Answer Assessment - Initial Assessment Questions 1. BLOOD PRESSURE: "What is the blood pressure?" "Did you take at least two measurements 5 minutes apart?"     166/101-today at dentist  Patient states her BP fluctuates  2. ONSET: "When did you take your blood pressure?"     Today- automatic machine 3. HOW: "How did you obtain the blood pressure?" (e.g., visiting nurse, automatic home BP monitor)     Automatic cuff- patient is home now and she is going to check it again- 181/101 P 66 second check 145/92 P 68 4. HISTORY: "Do you have a history of high blood pressure?"     No- it has varied 5. MEDICATIONS: "Are you taking any medications for blood pressure?" "Have you missed any doses recently?"     no 6. OTHER SYMPTOMS: "Do you have any symptoms?" (e.g., headache, chest pain, blurred vision, difficulty breathing, weakness)     No symptoms 7. PREGNANCY: "Is there any chance you are pregnant?" "When was your last menstrual period?"     Uses birth control patch- UPT- negative- patient had 1 day of spotting with last cycle  Protocols used: HIGH BLOOD PRESSURE-A-AH

## 2017-06-04 ENCOUNTER — Ambulatory Visit: Payer: Self-pay | Admitting: *Deleted

## 2017-06-04 NOTE — Telephone Encounter (Signed)
Pt reports B/P at dentist yesterday 166/101.  Checked with home monitor last HS, 155/85.  This am 166/98.  Reports 8/10 headache this am, "But may be starting my menses and I usually get a headache." HAs  Not taken any meds for headache. Denies any other symptoms; no weakness, CP, SOB, speech clear during call, no visual changes. Pt triaged yesterday and appt made for Saturday with Dr. Nolon Rod. Pt requesting earlier appt: declined Friday Am appt. Pt anxious as states strong family history of hypertension. Pt decided to go to [facility]. Care advise given per protocol.  Reason for Disposition . Systolic BP  >= 329 OR Diastolic >= 518  Answer Assessment - Initial Assessment Questions 1. BLOOD PRESSURE: "What is the blood pressure?" "Did you take at least two measurements 5 minutes apart?"    166/98 this AM, checked by EMT friend 2. ONSET: "When did you take your blood pressure?"     This Am 3. HOW: "How did you obtain the blood pressure?" (e.g., visiting nurse, automatic home BP monitor)     Dentist, and EMT friend 4. HISTORY: "Do you have a history of high blood pressure?"     No; strong family history 5. MEDICATIONS: "Are you taking any medications for blood pressure?" "Have you missed any doses recently?"     no 6. OTHER SYMPTOMS: "Do you have any symptoms?" (e.g., headache, chest pain, blurred vision, difficulty breathing, weakness)     Headache 8/10 this AM but may be starting cycle 7. PREGNANCY: "Is there any chance you are pregnant?" "When was your last menstrual period?"     No Did home test, negative.  Protocols used: HIGH BLOOD PRESSURE-A-AH

## 2017-06-06 ENCOUNTER — Other Ambulatory Visit: Payer: Self-pay

## 2017-06-06 ENCOUNTER — Encounter: Payer: Self-pay | Admitting: Family Medicine

## 2017-06-06 ENCOUNTER — Ambulatory Visit: Payer: Medicare Other | Admitting: Family Medicine

## 2017-06-06 VITALS — BP 145/85 | HR 66 | Temp 99.1°F | Resp 17 | Ht 66.0 in | Wt 151.6 lb

## 2017-06-06 DIAGNOSIS — L309 Dermatitis, unspecified: Secondary | ICD-10-CM

## 2017-06-06 DIAGNOSIS — N926 Irregular menstruation, unspecified: Secondary | ICD-10-CM

## 2017-06-06 DIAGNOSIS — I10 Essential (primary) hypertension: Secondary | ICD-10-CM | POA: Diagnosis not present

## 2017-06-06 LAB — POCT URINE PREGNANCY: Preg Test, Ur: NEGATIVE

## 2017-06-06 MED ORDER — AMLODIPINE BESYLATE 2.5 MG PO TABS: 2.5000 mg | ORAL_TABLET | Freq: Every day | ORAL | 0 refills | Status: DC

## 2017-06-06 MED ORDER — TRIAMCINOLONE ACETONIDE 0.1 % EX CREA: 1.0000 "application " | TOPICAL_CREAM | Freq: Two times a day (BID) | CUTANEOUS | 0 refills | Status: DC

## 2017-06-06 NOTE — Progress Notes (Signed)
Chief Complaint  Patient presents with  . Hypertension    seen at dentist last week and bp 166/101 and told she will need to take care of this before going under for anesthesia.  Missed menses, ? menopause and has taken 5 pregnancy test all negative, ? stress son is out of country studying abroad.  And pt has skin rash since 04/24/17 and is clearing -using anti itch cream    HPI    Hypertension: Patient here for follow-up of elevated blood pressure. She is not exercising and is not adherent to low salt diet.  Blood pressure is well controlled at home typically running 144/90, 166/98, 181/101 in the past 7 days. Cardiac symptoms none. Patient denies chest pain, chest pressure/discomfort, claudication, dyspnea, exertional chest pressure/discomfort, fatigue, irregular heart beat, lower extremity edema, near-syncope, orthopnea, palpitations and syncope.  Cardiovascular risk factors: hypertension. Use of agents associated with hypertension: none and contraception patch. History of target organ damage: none. BP Readings from Last 3 Encounters:  06/06/17 (!) 145/85  04/08/17 138/84  04/08/16 (!) 144/86   Missed period  Patient reports that her period has not come in since April 24, 2017 She reports that she has been on the patch in 2007 off and on and then stopped in 2014 and resumed in 2018 She has never missed her period She has hot flashes and sweats She is concerned about menopause Lab Results  Component Value Date   TSH 2.610 04/08/2017   She took 5 home pregnancy tests which were negative She is compliant with her ocps She is currently sexually active She took off her patch when she took off her patch on week 4 but has not put one back on and her period still has not come  Dermatitis Pt reports that she had a rash for a month on her forearms She things it is from pollen because it was only on the exposed areas She used otc cortisone and it resolved but flares up when she is outside.    No new meds No previous history of this related to her meds No fevers or chills Does not spread It itches   Past Medical History:  Diagnosis Date  . Anxiety   . Depression     Current Outpatient Medications  Medication Sig Dispense Refill  . haloperidol (HALDOL) 0.5 MG tablet Take 0.5 mg by mouth 1 day or 1 dose.    . lithium 300 MG tablet Take 300 mg by mouth 3 (three) times daily. Elixir formula    . norelgestromin-ethinyl estradiol (ORTHO EVRA) 150-35 MCG/24HR transdermal patch Place 1 patch onto the skin once a week. 3 patch 11  . Valproate Sodium (DEPAKENE) 250 MG/5ML SOLN solution Take 1 mL by mouth at bedtime as needed.    Marland Kitchen amLODipine (NORVASC) 2.5 MG tablet Take 1 tablet (2.5 mg total) by mouth daily. 90 tablet 0  . triamcinolone cream (KENALOG) 0.1 % Apply 1 application topically 2 (two) times daily. For no more than 2 weeks 30 g 0   No current facility-administered medications for this visit.     Allergies: No Known Allergies  No past surgical history on file.  Social History   Socioeconomic History  . Marital status: Divorced    Spouse name: Not on file  . Number of children: Not on file  . Years of education: Not on file  . Highest education level: Not on file  Occupational History  . Not on file  Social Needs  . Financial  resource strain: Not hard at all  . Food insecurity:    Worry: Never true    Inability: Never true  . Transportation needs:    Medical: No    Non-medical: No  Tobacco Use  . Smoking status: Never Smoker  . Smokeless tobacco: Never Used  Substance and Sexual Activity  . Alcohol use: No  . Drug use: No  . Sexual activity: Yes  Lifestyle  . Physical activity:    Days per week: 3 days    Minutes per session: 30 min  . Stress: Rather much  Relationships  . Social connections:    Talks on phone: Three times a week    Gets together: Three times a week    Attends religious service: Never    Active member of club or  organization: No    Attends meetings of clubs or organizations: Never    Relationship status: Divorced  Other Topics Concern  . Not on file  Social History Narrative  . Not on file    Family History  Problem Relation Age of Onset  . Heart disease Mother   . Hyperlipidemia Mother   . Mental illness Mother   . Diabetes Father   . Hypertension Sister   . Hyperlipidemia Brother   . Hypertension Brother   . Cancer Maternal Grandmother   . Cancer Maternal Grandfather   . Hyperlipidemia Maternal Grandfather   . Hypertension Maternal Grandfather   . Cancer Paternal Grandmother 45       breast cancer  . Hyperlipidemia Paternal Grandfather   . Hypertension Paternal Grandfather   . Cancer Maternal Aunt 40       breast cancer     ROS Review of Systems See HPI Constitution: No fevers or chills No malaise No diaphoresis Skin: No rash or itching Eyes: no blurry vision, no double vision GU: no dysuria or hematuria Neuro: no dizziness or headaches all others reviewed and negative   Objective: Vitals:   06/06/17 0824  BP: (!) 145/85  Pulse: 66  Resp: 17  Temp: 99.1 F (37.3 C)  TempSrc: Oral  SpO2: 100%  Weight: 151 lb 9.6 oz (68.8 kg)  Height: 5\' 6"  (1.676 m)    Physical Exam  Constitutional: She is oriented to person, place, and time. She appears well-developed and well-nourished.  HENT:  Head: Normocephalic and atraumatic.  Eyes: Conjunctivae and EOM are normal.  Cardiovascular: Normal rate, regular rhythm and normal heart sounds.  No murmur heard. Pulmonary/Chest: Effort normal and breath sounds normal. No stridor. No respiratory distress. She has no wheezes.  Abdominal: Soft. Bowel sounds are normal. She exhibits no distension and no mass. There is no tenderness. There is no guarding.  Neurological: She is alert and oriented to person, place, and time.  Psychiatric: She has a normal mood and affect. Her behavior is normal. Thought content normal.  Skin: old scar  from excoriation and rash  Urine hcg neg  Assessment and Plan  Problem List Items Addressed This Visit      Cardiovascular and Mediastinum   Essential hypertension - Primary    Pt has 3 bp readings that meet criteria. Advised los dose amlodipine.  HCTZ interacts with lithium. Will monitor in one month      Relevant Medications   amLODipine (NORVASC) 2.5 MG tablet     Musculoskeletal and Integument   Dermatitis    Unsure of cause. Is improving with otc cortisone. Sent in triamcinolone.  Other   Missed period    Discussed that she might be in the perimenopausal window  She is not pregnant and is compliant with her contraception Advised her to just continue to track her periods but to stay on the patch since her bipolar meds are teratogenic      Relevant Orders   POCT urine pregnancy (Completed)       Atisha Hamidi A Nolon Rod

## 2017-06-06 NOTE — Assessment & Plan Note (Signed)
Unsure of cause. Is improving with otc cortisone. Sent in triamcinolone.

## 2017-06-06 NOTE — Assessment & Plan Note (Signed)
Pt has 3 bp readings that meet criteria. Advised los dose amlodipine.  HCTZ interacts with lithium. Will monitor in one month

## 2017-06-06 NOTE — Patient Instructions (Addendum)
Stop taking the medication if your blood pressure drops below 100/60    IF you received an x-ray today, you will receive an invoice from [facility]. Please contact [facility] at [phone] with questions or concerns regarding your invoice.   IF you received labwork today, you will receive an invoice from [facility]. Please contact [facility] at [phone] with questions or concerns regarding your invoice.   Our billing staff will not be able to assist you with questions regarding bills from these companies.  You will be contacted with the lab results as soon as they are available. The fastest way to get your results is to activate your My Chart account. Instructions are located on the last page of this paperwork. If you have not heard from [place] regarding the results in 2 weeks, please contact this office.     DASH Eating Plan DASH stands for "Dietary Approaches to Stop Hypertension." The DASH eating plan is a healthy eating plan that has been shown to reduce high blood pressure (hypertension). It may also reduce your risk for type 2 diabetes, heart disease, and stroke. The DASH eating plan may also help with weight loss. What are tips for following this plan? General guidelines  Avoid eating more than 2,300 mg (milligrams) of salt (sodium) a day. If you have hypertension, you may need to reduce your sodium intake to 1,500 mg a day.  Limit alcohol intake to no more than 1 drink a day for nonpregnant women and 2 drinks a day for men. One drink equals 12 oz of beer, 5 oz of wine, or 1 oz of hard liquor.  Work with your health care provider to maintain a healthy body weight or to lose weight. Ask what an ideal weight is for you.  Get at least 30 minutes of exercise that causes your heart to beat faster (aerobic exercise) most days of the week. Activities may include walking, swimming, or biking.  Work with your health care provider or diet and nutrition specialist  (dietitian) to adjust your eating plan to your individual calorie needs. Reading food labels  Check food labels for the amount of sodium per serving. Choose foods with less than 5 percent of the Daily Value of sodium. Generally, foods with less than 300 mg of sodium per serving fit into this eating plan.  To find whole grains, look for the word "whole" as the first word in the ingredient list. Shopping  Buy products labeled as "low-sodium" or "no salt added."  Buy fresh foods. Avoid canned foods and premade or frozen meals. Cooking  Avoid adding salt when cooking. Use salt-free seasonings or herbs instead of table salt or sea salt. Check with your health care provider or pharmacist before using salt substitutes.  Do not fry foods. Cook foods using healthy methods such as baking, boiling, grilling, and broiling instead.  Cook with heart-healthy oils, such as olive, canola, soybean, or sunflower oil. Meal planning   Eat a balanced diet that includes: ? 5 or more servings of fruits and vegetables each day. At each meal, try to fill half of your plate with fruits and vegetables. ? Up to 6-8 servings of whole grains each day. ? Less than 6 oz of lean meat, poultry, or fish each day. A 3-oz serving of meat is about the same size as a deck of cards. One egg equals 1 oz. ? 2 servings of low-fat dairy each day. ? A serving of nuts, seeds, or beans 5 times each week. ?  Heart-healthy fats. Healthy fats called Omega-3 fatty acids are found in foods such as flaxseeds and coldwater fish, like sardines, salmon, and mackerel.  Limit how much you eat of the following: ? Canned or prepackaged foods. ? Food that is high in trans fat, such as fried foods. ? Food that is high in saturated fat, such as fatty meat. ? Sweets, desserts, sugary drinks, and other foods with added sugar. ? Full-fat dairy products.  Do not salt foods before eating.  Try to eat at least 2 vegetarian meals each week.  Eat  more home-cooked food and less restaurant, buffet, and fast food.  When eating at a restaurant, ask that your food be prepared with less salt or no salt, if possible. What foods are recommended? The items listed may not be a complete list. Talk with your dietitian about what dietary choices are best for you. Grains Whole-grain or whole-wheat bread. Whole-grain or whole-wheat pasta. Brown rice. [place]. Bulgur. Whole-grain and low-sodium cereals. Pita bread. Low-fat, low-sodium crackers. Whole-wheat flour tortillas. Vegetables Fresh or frozen vegetables (raw, steamed, roasted, or grilled). Low-sodium or reduced-sodium tomato and vegetable juice. Low-sodium or reduced-sodium tomato sauce and tomato paste. Low-sodium or reduced-sodium canned vegetables. Fruits All fresh, dried, or frozen fruit. Canned fruit in natural juice (without added sugar). Meat and other protein foods Skinless chicken or [place]. Ground chicken or [place]. Pork with fat trimmed off. Fish and seafood. Egg whites. Dried beans, peas, or lentils. Unsalted nuts, nut butters, and seeds. Unsalted canned beans. Lean cuts of beef with fat trimmed off. Low-sodium, lean deli meat. Dairy Low-fat (1%) or fat-free (skim) milk. Fat-free, low-fat, or reduced-fat cheeses. Nonfat, low-sodium ricotta or cottage cheese. Low-fat or nonfat yogurt. Low-fat, low-sodium cheese. Fats and oils Soft margarine without trans fats. Vegetable oil. Low-fat, reduced-fat, or light mayonnaise and salad dressings (reduced-sodium). Canola, safflower, olive, soybean, and sunflower oils. Avocado. Seasoning and other foods Herbs. Spices. Seasoning mixes without salt. Unsalted popcorn and pretzels. Fat-free sweets. What foods are not recommended? The items listed may not be a complete list. Talk with your dietitian about what dietary choices are best for you. Grains Baked goods made with fat, such as croissants, muffins, or some breads. Dry pasta or rice meal  packs. Vegetables Creamed or fried vegetables. Vegetables in a cheese sauce. Regular canned vegetables (not low-sodium or reduced-sodium). Regular canned tomato sauce and paste (not low-sodium or reduced-sodium). Regular tomato and vegetable juice (not low-sodium or reduced-sodium). Angie Fava. Olives. Fruits Canned fruit in a light or heavy syrup. Fried fruit. Fruit in cream or butter sauce. Meat and other protein foods Fatty cuts of meat. Ribs. Fried meat. Berniece Salines. Sausage. Bologna and other processed lunch meats. Salami. Fatback. Hotdogs. Bratwurst. Salted nuts and seeds. Canned beans with added salt. Canned or smoked fish. Whole eggs or egg yolks. Chicken or [place] with skin. Dairy Whole or 2% milk, cream, and half-and-half. Whole or full-fat cream cheese. Whole-fat or sweetened yogurt. Full-fat cheese. Nondairy creamers. Whipped toppings. Processed cheese and cheese spreads. Fats and oils Butter. Stick margarine. Lard. Shortening. Ghee. Bacon fat. Tropical oils, such as coconut, palm kernel, or palm oil. Seasoning and other foods Salted popcorn and pretzels. Onion salt, garlic salt, seasoned salt, table salt, and sea salt. Worcestershire sauce. Tartar sauce. Barbecue sauce. Teriyaki sauce. Soy sauce, including reduced-sodium. Steak sauce. Canned and packaged gravies. Fish sauce. Oyster sauce. Cocktail sauce. Horseradish that you find on the shelf. Ketchup. Mustard. Meat flavorings and tenderizers. Bouillon cubes. Hot sauce and Tabasco sauce.  Premade or packaged marinades. Premade or packaged taco seasonings. Relishes. Regular salad dressings. Where to find more information:  National Heart, Lung, and Opp: https://wilson-eaton.com/  American Heart Association: www.heart.org Summary  The DASH eating plan is a healthy eating plan that has been shown to reduce high blood pressure (hypertension). It may also reduce your risk for type 2 diabetes, heart disease, and stroke.  With the DASH eating  plan, you should limit salt (sodium) intake to 2,300 mg a day. If you have hypertension, you may need to reduce your sodium intake to 1,500 mg a day.  When on the DASH eating plan, aim to eat more fresh fruits and vegetables, whole grains, lean proteins, low-fat dairy, and heart-healthy fats.  Work with your health care provider or diet and nutrition specialist (dietitian) to adjust your eating plan to your individual calorie needs. This information is not intended to replace advice given to you by your health care provider. Make sure you discuss any questions you have with your health care provider. Document Released: 12/19/2010 Document Revised: 12/24/2015 Document Reviewed: 12/24/2015 Elsevier Interactive Patient Education  2018 [employer].  How to Take Your Blood Pressure Blood pressure is a measurement of how strongly your blood is pressing against the walls of your arteries. Arteries are blood vessels that carry blood from your heart throughout your body. Your health care provider takes your blood pressure at each office visit. You can also take your own blood pressure at home with a blood pressure machine. You may need to take your own blood pressure:  To confirm a diagnosis of high blood pressure (hypertension).  To monitor your blood pressure over time.  To make sure your blood pressure medicine is working.  Supplies needed: To take your blood pressure, you will need a blood pressure machine. You can buy a blood pressure machine, or blood pressure monitor, at most drugstores or online. There are several types of home blood pressure monitors. When choosing one, consider the following:  Choose a monitor that has an arm cuff.  Choose a monitor that wraps snugly around your upper arm. You should be able to fit only one finger between your arm and the cuff.  Do not choose a monitor that measures your blood pressure from your wrist or finger.  Your health care provider can suggest a  reliable monitor that will meet your needs. How to prepare To get the most accurate reading, avoid the following for 30 minutes before you check your blood pressure:  Drinking caffeine.  Drinking alcohol.  Eating.  Smoking.  Exercising.  Five minutes before you check your blood pressure:  Empty your bladder.  Sit quietly without talking in a dining chair, rather than in a soft couch or armchair.  How to take your blood pressure To check your blood pressure, follow the instructions in the manual that came with your blood pressure monitor. If you have a digital blood pressure monitor, the instructions may be as follows: 1. Sit up straight. 2. Place your feet on the floor. Do not cross your ankles or legs. 3. Rest your left arm at the level of your heart on a table or desk or on the arm of a chair. 4. Pull up your shirt sleeve. 5. Wrap the blood pressure cuff around the upper part of your left arm, 1 inch (2.5 cm) above your elbow. It is best to wrap the cuff around bare skin. 6. Fit the cuff snugly around your arm. You should be  able to place only one finger between the cuff and your arm. 7. Position the cord inside the groove of your elbow. 8. Press the power button. 9. Sit quietly while the cuff inflates and deflates. 10. Read the digital reading on the monitor screen and write it down (record it). 11. Wait 2-3 minutes, then repeat the steps, starting at step 1.  What does my blood pressure reading mean? A blood pressure reading consists of a higher number over a lower number. Ideally, your blood pressure should be below 120/80. The first ("top") number is called the systolic pressure. It is a measure of the pressure in your arteries as your heart beats. The second ("bottom") number is called the diastolic pressure. It is a measure of the pressure in your arteries as the heart relaxes. Blood pressure is classified into four stages. The following are the stages for adults who do  not have a short-term serious illness or a chronic condition. Systolic pressure and diastolic pressure are measured in a unit called mm Hg. Normal  Systolic pressure: below 638.  Diastolic pressure: below 80. Elevated  Systolic pressure: 756-433.  Diastolic pressure: below 80. Hypertension stage 1  Systolic pressure: 295-188.  Diastolic pressure: 41-66. Hypertension stage 2  Systolic pressure: 063 or above.  Diastolic pressure: 90 or above. You can have prehypertension or hypertension even if only the systolic or only the diastolic number in your reading is higher than normal. Follow these instructions at home:  Check your blood pressure as often as recommended by your health care provider.  Take your monitor to the next appointment with your health care provider to make sure: ? That you are using it correctly. ? That it provides accurate readings.  Be sure you understand what your goal blood pressure numbers are.  Tell your health care provider if you are having any side effects from blood pressure medicine. Contact a health care provider if:  Your blood pressure is consistently high. Get help right away if:  Your systolic blood pressure is higher than 180.  Your diastolic blood pressure is higher than 110. This information is not intended to replace advice given to you by your health care provider. Make sure you discuss any questions you have with your health care provider. Document Released: 06/08/2015 Document Revised: 08/21/2015 Document Reviewed: 06/08/2015 Elsevier Interactive Patient Education  [employer].

## 2017-06-06 NOTE — Assessment & Plan Note (Signed)
Discussed that she might be in the perimenopausal window  She is not pregnant and is compliant with her contraception Advised her to just continue to track her periods but to stay on the patch since her bipolar meds are teratogenic

## 2017-06-25 ENCOUNTER — Telehealth: Payer: Self-pay

## 2017-06-25 NOTE — Telephone Encounter (Signed)
Copied from [place] [phone]. Topic: General - Other >> Jun 25, 2017  9:10 AM Boyd Kerbs wrote: Reason for CRM:   Pt. Is needing a clarence letter from Dr. Nolon Rod for [place] regarding her BP, at her Sat. Appt. 6/22.  It will need to be there asap as they will not make appt for her until they receive letter.  Fax # [phone]  Forwarded message to Dr. Nolon Rod

## 2017-06-29 ENCOUNTER — Telehealth: Payer: Self-pay

## 2017-06-29 NOTE — Telephone Encounter (Signed)
Letter faxed. Dgaddy, CMA

## 2017-06-29 NOTE — Telephone Encounter (Signed)
Please fax to the dentist.

## 2017-06-29 NOTE — Telephone Encounter (Signed)
Dental clearance letter sent via fax to [place] to fax [phone] Dgaddy, CMA

## 2017-07-01 ENCOUNTER — Other Ambulatory Visit: Payer: Self-pay | Admitting: Family Medicine

## 2017-07-04 ENCOUNTER — Ambulatory Visit: Payer: Medicare Other | Admitting: Family Medicine

## 2017-07-04 NOTE — Progress Notes (Unsigned)
No chief complaint on file.   HPI  4 review of systems  Past Medical History:  Diagnosis Date  . Anxiety   . Depression     Current Outpatient Medications  Medication Sig Dispense Refill  . amLODipine (NORVASC) 2.5 MG tablet Take 1 tablet (2.5 mg total) by mouth daily. 90 tablet 0  . haloperidol (HALDOL) 0.5 MG tablet Take 0.5 mg by mouth 1 day or 1 dose.    . lithium 300 MG tablet Take 300 mg by mouth 3 (three) times daily. Elixir formula    . norelgestromin-ethinyl estradiol (ORTHO EVRA) 150-35 MCG/24HR transdermal patch Place 1 patch onto the skin once a week. 3 patch 11  . triamcinolone cream (KENALOG) 0.1 % APPLY TO SKIN TWICE A DAY FOR NO MORE THAN 2 WEEKS 30 g 0  . Valproate Sodium (DEPAKENE) 250 MG/5ML SOLN solution Take 1 mL by mouth at bedtime as needed.     No current facility-administered medications for this visit.     Allergies: No Known Allergies  No past surgical history on file.  Social History   Socioeconomic History  . Marital status: Divorced    Spouse name: Not on file  . Number of children: Not on file  . Years of education: Not on file  . Highest education level: Not on file  Occupational History  . Not on file  Social Needs  . Financial resource strain: Not hard at all  . Food insecurity:    Worry: Never true    Inability: Never true  . Transportation needs:    Medical: No    Non-medical: No  Tobacco Use  . Smoking status: Never Smoker  . Smokeless tobacco: Never Used  Substance and Sexual Activity  . Alcohol use: No  . Drug use: No  . Sexual activity: Yes  Lifestyle  . Physical activity:    Days per week: 3 days    Minutes per session: 30 min  . Stress: Rather much  Relationships  . Social connections:    Talks on phone: Three times a week    Gets together: Three times a week    Attends religious service: Never    Active member of club or organization: No    Attends meetings of clubs or organizations: Never    Relationship  status: Divorced  Other Topics Concern  . Not on file  Social History Narrative  . Not on file    Family History  Problem Relation Age of Onset  . Heart disease Mother   . Hyperlipidemia Mother   . Mental illness Mother   . Diabetes Father   . Hypertension Sister   . Hyperlipidemia Brother   . Hypertension Brother   . Cancer Maternal Grandmother   . Cancer Maternal Grandfather   . Hyperlipidemia Maternal Grandfather   . Hypertension Maternal Grandfather   . Cancer Paternal Grandmother 105       breast cancer  . Hyperlipidemia Paternal Grandfather   . Hypertension Paternal Grandfather   . Cancer Maternal Aunt 40       breast cancer     ROS Review of Systems See HPI Constitution: No fevers or chills No malaise No diaphoresis Skin: No rash or itching Eyes: no blurry vision, no double vision GU: no dysuria or hematuria Neuro: no dizziness or headaches * all others reviewed and negative   Objective: There were no vitals filed for this visit.  Physical Exam  Assessment and Plan There are no diagnoses linked to  this encounter.   Kristina Johnston

## 2017-07-13 ENCOUNTER — Other Ambulatory Visit: Payer: Self-pay | Admitting: Family Medicine

## 2017-07-13 ENCOUNTER — Other Ambulatory Visit: Payer: Self-pay

## 2017-07-13 ENCOUNTER — Ambulatory Visit (INDEPENDENT_AMBULATORY_CARE_PROVIDER_SITE_OTHER): Payer: Medicare Other | Admitting: Family Medicine

## 2017-07-13 ENCOUNTER — Encounter: Payer: Self-pay | Admitting: Family Medicine

## 2017-07-13 VITALS — BP 133/84 | HR 79 | Temp 99.1°F | Resp 16 | Ht 66.0 in | Wt 149.6 lb

## 2017-07-13 DIAGNOSIS — Z01818 Encounter for other preprocedural examination: Secondary | ICD-10-CM

## 2017-07-13 DIAGNOSIS — I10 Essential (primary) hypertension: Secondary | ICD-10-CM | POA: Diagnosis not present

## 2017-07-13 MED ORDER — AMLODIPINE BESYLATE 2.5 MG PO TABS: 2.5000 mg | ORAL_TABLET | Freq: Every day | ORAL | 0 refills | Status: DC

## 2017-07-13 NOTE — Telephone Encounter (Signed)
Refill request for triamcinolone cream 0.1%  30g with 0 refills. Dgaddy, CMA

## 2017-07-13 NOTE — Progress Notes (Signed)
Chief Complaint  Patient presents with  . f/u [facility] for [facility] work    HPI Her dentist requires preop clearance for anesthesia so she can get 5 fillings done.  Hypertension: Patient here for follow-up of elevated blood pressure. She is exercising and is adherent to low salt diet.  Blood pressure is well controlled at home. Cardiac symptoms none. Patient denies chest pain, chest pressure/discomfort, claudication, dyspnea, fatigue, lower extremity edema, orthopnea, palpitations and paroxysmal nocturnal dyspnea.  Cardiovascular risk factors: hypertension. Use of agents associated with hypertension: none. History of target organ damage: none.  She is here for clearance for anesthesia for fillings at [facility]. No adverse reactions to anesthesia in the past BP Readings from Last 3 Encounters:  07/13/17 133/84  06/06/17 (!) 145/85  04/08/17 138/84      Past Medical History:  Diagnosis Date  . Anxiety   . Depression     Current Outpatient Medications  Medication Sig Dispense Refill  . amLODipine (NORVASC) 2.5 MG tablet Take 1 tablet (2.5 mg total) by mouth daily. 90 tablet 0  . haloperidol (HALDOL) 0.5 MG tablet Take 0.5 mg by mouth 1 day or 1 dose.    . lithium 300 MG tablet Take 300 mg by mouth 3 (three) times daily. Elixir formula    . norelgestromin-ethinyl estradiol (ORTHO EVRA) 150-35 MCG/24HR transdermal patch Place 1 patch onto the skin once a week. 3 patch 11  . triamcinolone cream (KENALOG) 0.1 % APPLY TO SKIN TWICE A DAY FOR NO MORE THAN 2 WEEKS 30 g 0  . Valproate Sodium (DEPAKENE) 250 MG/5ML SOLN solution Take 1 mL by mouth at bedtime as needed.     No current facility-administered medications for this visit.     Allergies: No Known Allergies  No past surgical history on file.  Social History   Socioeconomic History  . Marital status: Divorced    Spouse name: Not on file  . Number of children: Not on file  . Years of education: Not on  file  . Highest education level: Not on file  Occupational History  . Not on file  Social Needs  . Financial resource strain: Not hard at all  . Food insecurity:    Worry: Never true    Inability: Never true  . Transportation needs:    Medical: No    Non-medical: No  Tobacco Use  . Smoking status: Never Smoker  . Smokeless tobacco: Never Used  Substance and Sexual Activity  . Alcohol use: No  . Drug use: No  . Sexual activity: Yes  Lifestyle  . Physical activity:    Days per week: 3 days    Minutes per session: 30 min  . Stress: Rather much  Relationships  . Social connections:    Talks on phone: Three times a week    Gets together: Three times a week    Attends religious service: Never    Active member of club or organization: No    Attends meetings of clubs or organizations: Never    Relationship status: Divorced  Other Topics Concern  . Not on file  Social History Narrative  . Not on file    Family History  Problem Relation Age of Onset  . Heart disease Mother   . Hyperlipidemia Mother   . Mental illness Mother   . Diabetes Father   . Hypertension Sister   . Hyperlipidemia Brother   . Hypertension Brother   . Cancer Maternal Grandmother   .  Cancer Maternal Grandfather   . Hyperlipidemia Maternal Grandfather   . Hypertension Maternal Grandfather   . Cancer Paternal Grandmother 26       breast cancer  . Hyperlipidemia Paternal Grandfather   . Hypertension Paternal Grandfather   . Cancer Maternal Aunt 40       breast cancer     ROS Review of Systems See HPI Constitution: No fevers or chills No malaise No diaphoresis Skin: No rash or itching Eyes: no blurry vision, no double vision GU: no dysuria or hematuria Neuro: no dizziness or headaches all others reviewed and negative   Objective: Vitals:   07/13/17 0956  BP: 133/84  Pulse: 79  Resp: 16  Temp: 99.1 F (37.3 C)  TempSrc: Oral  SpO2: 97%  Weight: 149 lb 9.6 oz (67.9 kg)  Height: 5'  6" (1.676 m)    Physical Exam  Constitutional: She is oriented to person, place, and time. She appears well-developed and well-nourished.  HENT:  Head: Normocephalic and atraumatic.  Eyes: Conjunctivae and EOM are normal.  Neck: Normal range of motion. Neck supple.  Cardiovascular: Normal rate, regular rhythm and normal heart sounds.  No murmur heard. Pulmonary/Chest: Effort normal and breath sounds normal. No stridor. No respiratory distress.  Neurological: She is alert and oriented to person, place, and time.  Skin: Skin is warm. Capillary refill takes less than 2 seconds.  Psychiatric: She has a normal mood and affect. Her behavior is normal. Judgment and thought content normal.   ECG independent review:  NSR, no twi, no st elevation  Assessment and Plan Kristina Johnston was seen today for f/u [facility] letter for dental work.  Diagnoses and all orders for this visit:  Essential hypertension- Patient's blood pressure is at goal of 139/89 or less. Condition is stable. Continue current medications and treatment plan. I recommend that you exercise for 30-45 minutes 5 days a week. I also recommend a balanced diet with fruits and vegetables every day, lean meats, and little fried foods. The DASH diet (you can find this online) is a good example of this.  -     EKG 12-Lead  Pre-op examination- cleared for dental procedure -     EKG 12-Lead  Other orders -     amLODipine (NORVASC) 2.5 MG tablet; Take 1 tablet (2.5 mg total) by mouth daily.     [place]

## 2017-07-13 NOTE — Patient Instructions (Signed)
     IF you received an x-ray today, you will receive an invoice from [facility]. Please contact  Radiology at [phone] with questions or concerns regarding your invoice.   IF you received labwork today, you will receive an invoice from [facility]. Please contact [facility] at [phone] with questions or concerns regarding your invoice.   Our billing staff will not be able to assist you with questions regarding bills from these companies.  You will be contacted with the lab results as soon as they are available. The fastest way to get your results is to activate your My Chart account. Instructions are located on the last page of this paperwork. If you have not heard from us regarding the results in 2 weeks, please contact this office.     

## 2017-08-12 ENCOUNTER — Other Ambulatory Visit: Payer: Self-pay | Admitting: Family Medicine

## 2017-08-12 NOTE — Telephone Encounter (Signed)
Pt called back and does not need the Kenalog cream

## 2017-08-12 NOTE — Telephone Encounter (Signed)
Attempted to call patient to see if she still needs to use the Kenalog 0.1% cream.  She was instructed to only use for 2 weeks. No answer, left message for patient to call the office back regarding this prescription.

## 2017-08-17 DIAGNOSIS — E860 Dehydration: Secondary | ICD-10-CM | POA: Diagnosis not present

## 2017-08-17 DIAGNOSIS — R55 Syncope and collapse: Secondary | ICD-10-CM | POA: Diagnosis not present

## 2017-08-29 ENCOUNTER — Other Ambulatory Visit: Payer: Self-pay

## 2017-08-29 ENCOUNTER — Encounter: Payer: Self-pay | Admitting: Family Medicine

## 2017-08-29 ENCOUNTER — Ambulatory Visit (INDEPENDENT_AMBULATORY_CARE_PROVIDER_SITE_OTHER): Payer: Medicare Other | Admitting: Family Medicine

## 2017-08-29 VITALS — BP 126/84 | HR 82 | Temp 98.2°F | Resp 17 | Ht 66.0 in | Wt 153.6 lb

## 2017-08-29 DIAGNOSIS — I10 Essential (primary) hypertension: Secondary | ICD-10-CM

## 2017-08-29 DIAGNOSIS — Z79899 Other long term (current) drug therapy: Secondary | ICD-10-CM | POA: Diagnosis not present

## 2017-08-29 DIAGNOSIS — F310 Bipolar disorder, current episode hypomanic: Secondary | ICD-10-CM | POA: Diagnosis not present

## 2017-08-29 DIAGNOSIS — E162 Hypoglycemia, unspecified: Secondary | ICD-10-CM | POA: Diagnosis not present

## 2017-08-29 LAB — POCT URINALYSIS DIP (MANUAL ENTRY)
Bilirubin, UA: NEGATIVE
Blood, UA: NEGATIVE
Glucose, UA: NEGATIVE mg/dL
Ketones, POC UA: NEGATIVE mg/dL
LEUKOCYTES UA: NEGATIVE
NITRITE UA: NEGATIVE
PH UA: 7 (ref 5.0–8.0)
PROTEIN UA: NEGATIVE mg/dL
Spec Grav, UA: 1.015 (ref 1.010–1.025)
Urobilinogen, UA: 0.2 E.U./dL

## 2017-08-29 LAB — POCT CBC
GRANULOCYTE PERCENT: 63.1 % (ref 37–80)
HCT, POC: 36.4 % — AB (ref 37.7–47.9)
HEMOGLOBIN: 12 g/dL — AB (ref 12.2–16.2)
Lymph, poc: 1.5 (ref 0.6–3.4)
MCH, POC: 30.9 pg (ref 27–31.2)
MCHC: 32.9 g/dL (ref 31.8–35.4)
MCV: 94 fL (ref 80–97)
MID (cbc): 0.3 (ref 0–0.9)
MPV: 7.7 fL (ref 0–99.8)
PLATELET COUNT, POC: 269 10*3/uL (ref 142–424)
POC Granulocyte: 3.1 (ref 2–6.9)
POC LYMPH PERCENT: 31.4 %L (ref 10–50)
POC MID %: 5.5 %M (ref 0–12)
RBC: 3.87 M/uL — AB (ref 4.04–5.48)
RDW, POC: 13.8 %
WBC: 4.9 10*3/uL (ref 4.6–10.2)

## 2017-08-29 LAB — GLUCOSE, POCT (MANUAL RESULT ENTRY): POC GLUCOSE: 72 mg/dL (ref 70–99)

## 2017-08-29 NOTE — Patient Instructions (Addendum)
If you have lab work done today you will be contacted with your lab results within the next 2 weeks.  If you have not heard from [place] then please contact us. The fastest way to get your results is to register for My Chart.   IF you received an x-ray today, you will receive an invoice from [facility]. Please contact [facility] at [phone] with questions or concerns regarding your invoice.   IF you received labwork today, you will receive an invoice from [facility]. Please contact [facility] at [phone] with questions or concerns regarding your invoice.   Our billing staff will not be able to assist you with questions regarding bills from these companies.  You will be contacted with the lab results as soon as they are available. The fastest way to get your results is to activate your My Chart account. Instructions are located on the last page of this paperwork. If you have not heard from [place] regarding the results in 2 weeks, please contact this office.     Mania Mania is a condition that affects people who have certain mood disorders. Mania involves episodes of emotional highs that include having very high energy, racing thoughts, very high self-esteem, and decreased ability to concentrate. These episodes are very intense and can last longer than a week. In some cases, episodes of mania can be so strong that people with this condition need to be hospitalized for their safety and the safety of people around them. What are the causes? The cause of this condition is not known. What increases the risk? You are more likely to develop mania if you have a mood disorder, especially bipolar disorder. If you have a mood disorder, the following factors may increase your risk of developing mania:  Not getting enough sleep.  Using substances such as tobacco, caffeine, or illegal drugs.  Certain prescription medicines, such as antidepressants or antibiotics.  Stress or  emotional events.  Certain seasons. Mania is more common in spring and summer.  The period of time after having a baby (postpartum period).  What are the signs or symptoms? Symptoms of this condition include:  Periods of having very high energy that may last longer than a week. In some cases, you have so much energy that you may become unsafe and need to go to the hospital.  Very high self-esteem or self-confidence.  Decreased need for sleep.  Being unusually talkative, or feeling a need to keep talking. Speech may be very fast. It may seem like you cannot stop talking.  Racing thoughts or constant talking, with quick shifts between topics that may or may not be related (flight of ideas).  Decreased ability to focus or concentrate.  Increased purposeful activity, such as work, study, or social activity.  Increased nonproductive activity. This could be pacing, squirming and fidgeting, or finger and toe tapping.  Impulsive behavior and poor judgment. This may result in high-risk activities, such as having unprotected sex or spending a lot of money.  Having false beliefs (delusions) or seeing, hearing, or feeling things that do not exist (hallucinations).  How is this diagnosed? This condition may be diagnosed based on:  Your symptoms and medical history.  A physical exam. Your health care provider will check for physical conditions that may be causing your symptoms.  A mental health evaluation. You may be referred to a mental health provider who specializes in diagnosing and treating mood disorders.  How is this treated? This condition may  be treated with:  Medicines, such as mood stabilizers.  Talk therapy (psychotherapy) with a mental health provider.  A procedure to change the brain chemicals that send messages between brain cells (neurotransmitters). This procedure, called electroconvulsive therapy (ECT), applies short electrical pulses to the brain through the scalp.  This may be used in cases of severe mania when other treatments have not helped.  Follow these instructions at home:  Take over-the-counter and prescription medicines only as told by your health care provider.  Try to go to sleep and wake up at the same time every day.  Make and follow a routine for daily meal times.  Ask for support from family, friends, or relatives to make sure you stay on track with your treatment.  Keep all follow-up visits as told by your health care provider. This is important. Contact a health care provider if:  You have concerns about your treatment.  You have side effects from your prescription medicines.  Your symptoms do not improve or they get worse.  Your mania may be putting your health, or others' health, at risk. Get help right away if:  You think about hurting yourself or you try to hurt yourself.  You think about suicide. If you ever feel like you may hurt yourself or others, or have thoughts about taking your own life, get help right away. You can go to your nearest emergency department or call:  Your local emergency services (911 in the [place]  A suicide crisis helpline, such as the [facility] at 870 514 3210. This is open 24 hours a day.  Summary  Mania involves episodes of emotional highs that include having very high energy, racing thoughts, very high self-esteem, and decreased ability to concentrate.  Episodes of mania are very intense and can last longer than a week.  Treatment for mania may include medicines and talk therapy (psychotherapy). This information is not intended to replace advice given to you by your health care provider. Make sure you discuss any questions you have with your health care provider. Document Released: 04/25/2016 Document Revised: 04/25/2016 Document Reviewed: 04/25/2016 Elsevier Interactive Patient Education  [employer].

## 2017-08-29 NOTE — Progress Notes (Signed)
Chief Complaint  Patient presents with  . blood sugar/ hypertension check    pt c/o pressure in head when wearing hair up, and family hx of hypoglycemia    HPI Essential Hypertension  She reports that she has been  She states that she is worried about her health She states that she feels like she can improve her bp She has been eating [place] ham canned soup She exercises by walking Wt Readings from Last 3 Encounters:  08/29/17 153 lb 9.6 oz (69.7 kg)  07/13/17 149 lb 9.6 oz (67.9 kg)  06/06/17 151 lb 9.6 oz (68.8 kg)    Bipolar Disorder  Depression screen [facility] 2/9 08/29/2017 07/13/2017 06/06/2017 04/08/2017 04/08/2016  Decreased Interest 1 0 0 0 2  Down, Depressed, Hopeless 0 0 0 0 2  PHQ - 2 Score 1 0 0 0 4  Altered sleeping - - - - 3  Tired, decreased energy - - - - 3  Change in appetite - - - - 1  Feeling bad or failure about yourself  - - - - 3  Trouble concentrating - - - - 3  Moving slowly or fidgety/restless - - - - 3  Suicidal thoughts - - - - 0  PHQ-9 Score - - - - 20   She reports that she is not interested in sex, watching tv,  She has not been sleeping well The past few days she has been hallucinating more and seeing things She states that she talks to God and he answers She states that the does not have suicidal thoughts The voices and visions do not tell her harmful things She tells her boyfriend what is going on when it happens but she ends up not trusting people so she stays home She has a Teacher, 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 Anxiety   . Depression     Current Outpatient Medications  Medication Sig Dispense Refill  . amLODipine (NORVASC) 2.5 MG tablet Take 1 tablet (2.5 mg total) by mouth daily. 90 tablet 0  . haloperidol (HALDOL) 0.5 MG tablet Take 0.5 mg by mouth 1 day or 1 dose.    . lithium 300 MG tablet Take 300 mg by mouth 3 (three) times daily.  Elixir formula    . norelgestromin-ethinyl estradiol (ORTHO EVRA) 150-35 MCG/24HR transdermal patch Place 1 patch onto the skin once a week. 3 patch 11  . triamcinolone cream (KENALOG) 0.1 % APPLY TO SKIN TWICE A DAY FOR NO MORE THAN 2 WEEKS 30 g 0  . Valproate Sodium (DEPAKENE) 250 MG/5ML SOLN solution Take 1 mL by mouth at bedtime as needed.     No current facility-administered medications for this visit.     Allergies: No Known Allergies  No past surgical history on file.  Social History   Socioeconomic History  . Marital status: Divorced    Spouse name: Not on file  . Number of children: Not on file  . Years of education: Not on file  . Highest education level: Not on file  Occupational History  . Not on file  Social Needs  . Financial resource strain: Not hard at all  . Food insecurity:    Worry: Never true    Inability: Never true  . Transportation needs:    Medical: No    Non-medical: No  Tobacco Use  . Smoking status: Never Smoker  .  Smokeless tobacco: Never Used  Substance and Sexual Activity  . Alcohol use: No  . Drug use: No  . Sexual activity: Yes  Lifestyle  . Physical activity:    Days per week: 3 days    Minutes per session: 30 min  . Stress: Rather much  Relationships  . Social connections:    Talks on phone: Three times a week    Gets together: Three times a week    Attends religious service: Never    Active member of club or organization: No    Attends meetings of clubs or organizations: Never    Relationship status: Divorced  Other Topics Concern  . Not on file  Social History Narrative  . Not on file    Family History  Problem Relation Age of Onset  . Heart disease Mother   . Hyperlipidemia Mother   . Mental illness Mother   . Diabetes Father   . Hypertension Sister   . Hyperlipidemia Brother   . Hypertension Brother   . Cancer Maternal Grandmother   . Cancer Maternal Grandfather   . Hyperlipidemia Maternal Grandfather   .  Hypertension Maternal Grandfather   . Cancer Paternal Grandmother 11       breast cancer  . Hyperlipidemia Paternal Grandfather   . Hypertension Paternal Grandfather   . Cancer Maternal Aunt 40       breast cancer     ROS Review of Systems See HPI Constitution: No fevers or chills No malaise No diaphoresis Skin: No rash or itching Eyes: no blurry vision, no double vision GU: no dysuria or hematuria Neuro: no dizziness or headaches all others reviewed and negative   Objective: Vitals:   08/29/17 0815 08/29/17 0904  BP: (!) 148/90 126/84  Pulse: 82   Resp: 17   Temp: 98.2 F (36.8 C)   TempSrc: Oral   SpO2: 98%   Weight: 153 lb 9.6 oz (69.7 kg)   Height: 5\' 6"  (1.676 m)    Wt Readings from Last 3 Encounters:  08/29/17 153 lb 9.6 oz (69.7 kg)  07/13/17 149 lb 9.6 oz (67.9 kg)  06/06/17 151 lb 9.6 oz (68.8 kg)     Physical Exam  Constitutional: She is oriented to person, place, and time. She appears well-developed and well-nourished.  HENT:  Head: Normocephalic and atraumatic.  Eyes: Conjunctivae and EOM are normal.  Neck: Normal range of motion. Neck supple. No thyromegaly present.  Cardiovascular: Normal rate, regular rhythm and normal heart sounds.  No murmur heard. Pulmonary/Chest: Effort normal and breath sounds normal. No stridor. No respiratory distress. She has no wheezes.  Neurological: She is alert and oriented to person, place, and time.  Psychiatric: She has a normal mood and affect. Her behavior is normal. Judgment and thought content normal.    Assessment and Plan Kristina Johnston was seen today for blood sugar/ hypertension check.  Diagnoses and all orders for this visit:  Essential hypertension- Patient's blood pressure is at goal of 139/89 or less. Condition is stable. Continue current medications and treatment plan. I recommend that you exercise for 30-45 minutes 5 days a week. I also recommend a balanced diet with fruits and vegetables every day,  lean meats, and little fried foods. The DASH diet (you can find this online) is a good example of this.  -     Comprehensive metabolic panel  Bipolar affective disorder, current episode hypomanic [facility])- discussed follow up with Psychiatry Will fax meds to Dr. Toy Care when they are resulted.  Pt to follow up with Psychiatry or to the ER if symptoms worsen.  -     POCT CBC -     POCT glucose (manual entry) -     POCT urinalysis dipstick -     TSH -     Lithium level  Hypoglycemia -     POCT glucose (manual entry)  Lithium use- will check levels  -     Lithium level   A total of 30 minutes were spent face-to-face with the patient during this encounter and over half of that time was spent on counseling and coordination of care.   [place]

## 2017-08-30 LAB — COMPREHENSIVE METABOLIC PANEL
ALBUMIN: 3.9 g/dL (ref 3.5–5.5)
ALT: 13 IU/L (ref 0–32)
AST: 17 IU/L (ref 0–40)
Albumin/Globulin Ratio: 1.3 (ref 1.2–2.2)
Alkaline Phosphatase: 46 IU/L (ref 39–117)
BUN / CREAT RATIO: 12 (ref 9–23)
BUN: 10 mg/dL (ref 6–24)
CHLORIDE: 103 mmol/L (ref 96–106)
CO2: 20 mmol/L (ref 20–29)
Calcium: 9.2 mg/dL (ref 8.7–10.2)
Creatinine, Ser: 0.83 mg/dL (ref 0.57–1.00)
GFR calc Af Amer: 96 mL/min/{1.73_m2} (ref >59)
GFR calc non Af Amer: 84 mL/min/{1.73_m2} (ref >59)
GLOBULIN, TOTAL: 2.9 g/dL (ref 1.5–4.5)
GLUCOSE: 77 mg/dL (ref 65–99)
Potassium: 4.1 mmol/L (ref 3.5–5.2)
SODIUM: 138 mmol/L (ref 134–144)
Total Protein: 6.8 g/dL (ref 6.0–8.5)

## 2017-08-30 LAB — LITHIUM LEVEL: Lithium Lvl: <0.1 mmol/L — ABNORMAL LOW (ref 0.6–1.2)

## 2017-08-30 LAB — TSH: TSH: 2.85 u[IU]/mL (ref 0.450–4.500)

## 2017-09-01 ENCOUNTER — Encounter: Payer: Self-pay | Admitting: Family Medicine

## 2017-09-01 ENCOUNTER — Other Ambulatory Visit: Payer: Self-pay | Admitting: Family Medicine

## 2017-09-01 NOTE — Progress Notes (Signed)
Dr. Chucky May [address], [place], [place] [address]

## 2017-09-01 NOTE — Telephone Encounter (Signed)
Amlodipine 2.5 mgrefill Last Refill:07/13/17 # 90 Last OV: 08/29/17 PCP: Burnett Harry Pharmacy:[facility] # [phone]

## 2017-09-11 ENCOUNTER — Telehealth: Payer: Self-pay | Admitting: Family Medicine

## 2017-09-11 NOTE — Telephone Encounter (Signed)
Sarah from dr. Bonnita Levan office left a voice message on MR phone wishing to speak with Delores about this pt.  She states that she is giving a follow up call about a request of specific dates of service that was called in on 08/31/17.  I'm not sure what those dates are and I do not have a release for this pt.  Please advise Judson Roch at [phone], fax is [phone]

## 2017-09-12 ENCOUNTER — Encounter (HOSPITAL_COMMUNITY): Payer: Self-pay

## 2017-09-12 ENCOUNTER — Emergency Department (HOSPITAL_COMMUNITY): Payer: Medicare Other

## 2017-09-12 ENCOUNTER — Other Ambulatory Visit: Payer: Self-pay

## 2017-09-12 ENCOUNTER — Emergency Department (HOSPITAL_COMMUNITY)
Admission: EM | Admit: 2017-09-12 | Discharge: 2017-09-12 | Disposition: A | Discharge status: Home / Self Care | Payer: Medicare Other | Attending: Emergency Medicine | Admitting: Emergency Medicine

## 2017-09-12 DIAGNOSIS — R072 Precordial pain: Secondary | ICD-10-CM

## 2017-09-12 DIAGNOSIS — M79601 Pain in right arm: Secondary | ICD-10-CM | POA: Insufficient documentation

## 2017-09-12 DIAGNOSIS — I10 Essential (primary) hypertension: Secondary | ICD-10-CM | POA: Diagnosis not present

## 2017-09-12 DIAGNOSIS — R079 Chest pain, unspecified: Secondary | ICD-10-CM | POA: Diagnosis not present

## 2017-09-12 LAB — BASIC METABOLIC PANEL
ANION GAP: 8 (ref 5–15)
BUN: 14 mg/dL (ref 6–20)
CO2: 29 mmol/L (ref 22–32)
Calcium: 8.9 mg/dL (ref 8.9–10.3)
Chloride: 105 mmol/L (ref 98–111)
Creatinine, Ser: 0.75 mg/dL (ref 0.44–1.00)
GFR calc Af Amer: >60 mL/min (ref >60)
Glucose, Bld: 104 mg/dL — ABNORMAL HIGH (ref 70–99)
Potassium: 3.9 mmol/L (ref 3.5–5.1)
Sodium: 142 mmol/L (ref 135–145)

## 2017-09-12 LAB — CBC
HCT: 37.3 % (ref 36.0–46.0)
Hemoglobin: 12.6 g/dL (ref 12.0–15.0)
MCH: 30.7 pg (ref 26.0–34.0)
MCHC: 33.8 g/dL (ref 30.0–36.0)
MCV: 91 fL (ref 78.0–100.0)
Platelets: 302 10*3/uL (ref 150–400)
RBC: 4.1 MIL/uL (ref 3.87–5.11)
RDW: 13 % (ref 11.5–15.5)
WBC: 3.9 10*3/uL — AB (ref 4.0–10.5)

## 2017-09-12 LAB — I-STAT BETA HCG BLOOD, ED (MC, WL, AP ONLY)

## 2017-09-12 LAB — I-STAT TROPONIN, ED: Troponin i, poc: 0.01 ng/mL (ref 0.00–0.08)

## 2017-09-12 LAB — D-DIMER, QUANTITATIVE: D-Dimer, Quant: 0.4 ug/mL-FEU (ref 0.00–0.50)

## 2017-09-12 MED ORDER — TRAMADOL HCL 50 MG PO TABS: 50.0000 mg | ORAL_TABLET | Freq: Four times a day (QID) | ORAL | 0 refills | Status: DC | PRN

## 2017-09-12 MED ORDER — IBUPROFEN 800 MG PO TABS: 800.0000 mg | ORAL_TABLET | Freq: Four times a day (QID) | ORAL | 0 refills | Status: DC | PRN

## 2017-09-12 NOTE — ED Provider Notes (Signed)
Emergency Department Provider Note   I have reviewed the triage vital signs and the nursing notes.   HISTORY  Chief Complaint Chest Pain   HPI Kristina Johnston is a 47 y.o. female with PMH of anxiety and depression's to the emergency department for evaluation of intermittent right arm/chest pain.  Symptoms have been intermittent and increasing in frequency over the past 2 weeks.  Patient last had pain this morning she states that last for several minutes and then resolves without difficulty.  Denies any shortness of breath, productive cough, hemoptysis.  No provoking factors.  No similar pain in the past.  Denies any pain in the neck or neck injury.  Past Medical History:  Diagnosis Date  . Anxiety   . Depression     Patient Active Problem List   Diagnosis Date Noted  . Essential hypertension 06/06/2017  . Missed period 06/06/2017  . Dermatitis 06/06/2017  . Bipolar disorder ([place]) 04/08/2017  . Screen for STD (sexually transmitted disease) 04/08/2016  . Pap smear for cervical cancer screening 04/08/2016  . Screening for deficiency anemia 04/08/2016  . Screening for HIV (human immunodeficiency virus) 04/08/2016  . Elevated blood-pressure reading without diagnosis of hypertension 04/08/2016  . Low back pain 04/08/2016  . Depression 03/17/2011  . Annual physical exam 03/17/2011    History reviewed. No pertinent surgical history.  Allergies Patient has no known allergies.  Family History  Problem Relation Age of Onset  . Heart disease Mother   . Hyperlipidemia Mother   . Mental illness Mother   . Diabetes Father   . Hypertension Sister   . Hyperlipidemia Brother   . Hypertension Brother   . Cancer Maternal Grandmother   . Cancer Maternal Grandfather   . Hyperlipidemia Maternal Grandfather   . Hypertension Maternal Grandfather   . Cancer Paternal Grandmother 55       breast cancer  . Hyperlipidemia Paternal Grandfather   . Hypertension Paternal Grandfather   .  Cancer Maternal Aunt 40       breast cancer    Social History Social History   Tobacco Use  . Smoking status: Never Smoker  . Smokeless tobacco: Never Used  Substance Use Topics  . Alcohol use: No  . Drug use: No    Review of Systems  Constitutional: No fever/chills Eyes: No visual changes. ENT: No sore throat. Cardiovascular: Positive chest pain and right arm pain.  Respiratory: Denies shortness of breath. Gastrointestinal: No abdominal pain.  No nausea, no vomiting.  No diarrhea.  No constipation. Genitourinary: Negative for dysuria. Musculoskeletal: Negative for back pain. Skin: Negative for rash. Neurological: Negative for headaches, focal weakness or numbness.  10-point ROS otherwise negative.  ____________________________________________   PHYSICAL EXAM:  VITAL SIGNS: ED Triage Vitals  Enc Vitals Group     BP 09/12/17 1508 (!) 152/74     Pulse Rate 09/12/17 1508 75     Resp 09/12/17 1508 18     Temp 09/12/17 1508 98.3 F (36.8 C)     Temp Source 09/12/17 1508 Oral     SpO2 09/12/17 1508 100 %     Weight 09/12/17 1523 153 lb (69.4 kg)     Height 09/12/17 1523 5\' 4"  (1.626 m)     Pain Score 09/12/17 1523 3   Constitutional: Alert and oriented. Well appearing and in no acute distress. Eyes: Conjunctivae are normal.  Head: Atraumatic. Nose: No congestion/rhinnorhea. Mouth/Throat: Mucous membranes are moist.  Oropharynx non-erythematous. Neck: No stridor.   Cardiovascular: Normal  rate, regular rhythm. Good peripheral circulation. Grossly normal heart sounds.   Respiratory: Normal respiratory effort.  No retractions. Lungs CTAB. Gastrointestinal: Soft and nontender. No distention.  Musculoskeletal: No lower extremity tenderness nor edema. No gross deformities of extremities. Neurologic:  Normal speech and language. No gross focal neurologic deficits are appreciated.  Skin:  Skin is warm, dry and intact. No rash  noted.  ____________________________________________   LABS (all labs ordered are listed, but only abnormal results are displayed)  Labs Reviewed  BASIC METABOLIC PANEL - Abnormal; Notable for the following components:      Result Value   Glucose, Bld 104 (*)    All other components within normal limits  CBC - Abnormal; Notable for the following components:   WBC 3.9 (*)    All other components within normal limits  D-DIMER, QUANTITATIVE (NOT AT [facility])  I-STAT TROPONIN, ED  I-STAT BETA HCG BLOOD, ED ([facility], [facility], [facility] ONLY)   ____________________________________________  EKG   EKG Interpretation  Date/Time:  Saturday September 12 2017 15:12:00 EDT Ventricular Rate:  72 PR Interval:    QRS Duration: 97 QT Interval:  373 QTC Calculation: 409 R Axis:   72 Text Interpretation:  Sinus rhythm No STEMI.  Confirmed by Nanda Quinton [phone]) on 09/12/2017 3:54:20 PM       ____________________________________________  RADIOLOGY  Dg Chest 2 View  Result Date: 09/12/2017 CLINICAL DATA:  Pt states she had a fall at the end of July, and was due to have teeth fillings in mid August, Pt states that since then, she has been sleeping 2-3 hours on average. Pt states she has been having chest pain and right arm pain. EXAM: CHEST - 2 VIEW COMPARISON:  07/08/2009 FINDINGS: Normal mediastinum and cardiac silhouette. Normal pulmonary vasculature. No evidence of effusion, infiltrate, or pneumothorax. No acute bony abnormality. IMPRESSION: Abnormal chest radiograph Electronically Signed   By: Suzy Bouchard M.D.   On: 09/12/2017 16:30    ____________________________________________   PROCEDURES  Procedure(s) performed:   Procedures  None ____________________________________________   INITIAL IMPRESSION / ASSESSMENT AND PLAN / ED COURSE  Pertinent labs & imaging results that were available during my care of the patient were reviewed by me and considered in my medical decision making (see chart  for details).  Patient presents to the emergency department with intermittent chest pain.  The pain character is sharp and atypical for ACS.  She has few risk factors for acute coronary syndrome.  Patient is low risk for PE with atypical pain plan for d-dimer as well as troponin, labs, chest x-ray.  Patient is not experiencing pain at this time.   Differential includes all life-threatening causes for chest pain. This includes but is not exclusive to acute coronary syndrome, aortic dissection, pulmonary embolism, cardiac tamponade, community-acquired pneumonia, pericarditis, musculoskeletal chest wall pain, etc.  Labs and CXR reviewed. No acute findings. Plan for discharge with PCP follow up.   At this time, I do not feel there is any life-threatening condition present. I have reviewed and discussed all results (EKG, imaging, lab, urine as appropriate), exam findings with patient. I have reviewed nursing notes and appropriate previous records.  I feel the patient is safe to be discharged home without further emergent workup. Discussed usual and customary return precautions. Patient and family (if present) verbalize understanding and are comfortable with this plan.  Patient will follow-up with their primary care provider. If they do not have a primary care provider, information for follow-up has been provided to  them. All questions have been answered.   ____________________________________________  FINAL CLINICAL IMPRESSION(S) / ED DIAGNOSES  Final diagnoses:  Precordial chest pain    NEW OUTPATIENT MEDICATIONS STARTED DURING THIS VISIT:  Discharge Medication List as of 09/12/2017  5:37 PM    START taking these medications   Details  ibuprofen (ADVIL,MOTRIN) 800 MG tablet Take 1 tablet (800 mg total) by mouth every 6 (six) hours as needed., Starting Sat 09/12/2017, Print    traMADol (ULTRAM) 50 MG tablet Take 1 tablet (50 mg total) by mouth every 6 (six) hours as needed for severe pain.,  Starting Sat 09/12/2017, Print        Note:  This document was prepared using Dragon voice recognition software and may include unintentional dictation errors.  Nanda Quinton, MD Emergency Medicine    Gwen Sarvis, [facility], MD 09/13/17 1048

## 2017-09-12 NOTE — Discharge Instructions (Signed)

## 2017-09-12 NOTE — ED Triage Notes (Signed)
Pt states she had a fall at the end of July, and was due to have teeth fillings in mid August, Pt states that since then,she has been sleeping 2-3 hours on average. Pt states she has been having chest pain and right arm pain. Pt states she been taking extra valproic acid for her pain, without relief.

## 2017-09-16 ENCOUNTER — Telehealth: Payer: Self-pay | Admitting: Family Medicine

## 2017-09-16 NOTE — Telephone Encounter (Signed)
Left message on pt voicemail to come by office and sign a new ROI giving [place] permission to receive her records from Dr.  Toy Care 's office an advised to call office with any further questions or concerns.

## 2017-09-16 NOTE — Telephone Encounter (Signed)
Copied from [place] [id number]. Topic: Quick Communication - See Telephone Encounter >> Sep 16, 2017  1:11 PM Antonieta Iba C wrote: CRM for notification. See Telephone encounter for: 09/16/17.   Pt returned call in regards to coming by the office and signing paperwork. Pt just wanted to let Delores know that she will be by the office one day next week to sign.

## 2017-09-17 ENCOUNTER — Telehealth: Payer: Self-pay | Admitting: Family Medicine

## 2017-09-17 NOTE — Telephone Encounter (Signed)
Copied from [place] [id number]. Topic: General - Other >> Sep 17, 2017  8:02 AM Cecelia Byars, NT wrote: Reason for CRM: *Patient called and said she will come by this morning to fill out the paper work instead next week

## 2017-09-17 NOTE — Telephone Encounter (Signed)
Pt in this morning and signed new ROI for dr Toy Care office to send records- records request from May 2004 to present. Continuity of care. Dgaddy, CMA

## 2017-09-17 NOTE — Telephone Encounter (Signed)
FYI please see note below

## 2017-09-18 NOTE — Telephone Encounter (Signed)
Pt came by office and signed release. Dgaddy, CMA

## 2017-09-18 NOTE — Telephone Encounter (Signed)
Pt came by and signed new ROI Dgaddy, CMA

## 2017-09-21 ENCOUNTER — Telehealth: Payer: Self-pay | Admitting: Family Medicine

## 2017-09-21 NOTE — Telephone Encounter (Signed)
Received records from [facility] on 09/20/17

## 2017-09-24 ENCOUNTER — Encounter: Payer: Self-pay | Admitting: Family Medicine

## 2017-09-25 ENCOUNTER — Telehealth: Payer: Self-pay | Admitting: Family Medicine

## 2017-09-25 NOTE — Telephone Encounter (Signed)
Called pt. To reschedule visit on 10/15/17. Rscheduled w/pt

## 2017-10-15 ENCOUNTER — Ambulatory Visit: Payer: Medicare Other | Admitting: Family Medicine

## 2017-11-03 ENCOUNTER — Other Ambulatory Visit: Payer: Self-pay

## 2017-11-03 ENCOUNTER — Ambulatory Visit: Payer: Medicare Other | Admitting: Family Medicine

## 2017-11-03 ENCOUNTER — Encounter: Payer: Self-pay | Admitting: Family Medicine

## 2017-11-03 VITALS — BP 136/82 | HR 67 | Temp 98.5°F | Resp 14 | Ht 64.0 in | Wt 162.4 lb

## 2017-11-03 DIAGNOSIS — I10 Essential (primary) hypertension: Secondary | ICD-10-CM | POA: Diagnosis not present

## 2017-11-03 MED ORDER — AMLODIPINE BESYLATE 5 MG PO TABS: ORAL_TABLET | ORAL | 1 refills | Status: DC

## 2017-11-03 NOTE — Progress Notes (Signed)
Chief Complaint  Patient presents with  . Follow-up    09/12/2017 follow up from ER visit- Elvated BP    HPI  She reports that she has been having labile blood pressures She states that she eats a salty diet with processed meats She states that she takes her medications   Wt Readings from Last 3 Encounters:  11/03/17 162 lb 6.4 oz (73.7 kg)  09/12/17 153 lb (69.4 kg)  08/29/17 153 lb 9.6 oz (69.7 kg)    BP Readings from Last 3 Encounters:  11/03/17 136/82  09/12/17 140/89  08/29/17 126/84     Past Medical History:  Diagnosis Date  . Anxiety   . Depression   . Paranoid schizophrenia ([place])     Current Outpatient Medications  Medication Sig Dispense Refill  . amLODipine (NORVASC) 5 MG tablet Take 2 half tablets by mouth once daily. 90 tablet 1  . aspirin 325 MG tablet Take 650 mg by mouth every 4 (four) hours as needed for moderate pain.    . calcium-vitamin D (OSCAL WITH D) 250-125 MG-UNIT tablet Take 1 tablet by mouth daily.    . haloperidol (HALDOL) 2 MG/ML solution Take 2 mg by mouth daily.   12  . ibuprofen (ADVIL,MOTRIN) 800 MG tablet Take 1 tablet (800 mg total) by mouth every 6 (six) hours as needed. 21 tablet 0  . Multiple Vitamins-Minerals (ADULT ONE DAILY GUMMIES PO) Take 2 tablets by mouth daily.    . norelgestromin-ethinyl estradiol (ORTHO EVRA) 150-35 MCG/24HR transdermal patch Place 1 patch onto the skin once a week. (Patient taking differently: Place 1 patch onto the skin every Sunday. ) 3 patch 11  . traMADol (ULTRAM) 50 MG tablet Take 1 tablet (50 mg total) by mouth every 6 (six) hours as needed for severe pain. 15 tablet 0  . triamcinolone cream (KENALOG) 0.1 % APPLY TO SKIN TWICE A DAY FOR NO MORE THAN 2 WEEKS (Patient taking differently: Apply 1 application topically 2 (two) times daily as needed (Rash). ) 30 g 0  . Valproate Sodium (DEPAKENE) 250 MG/5ML SOLN solution Take 2 mLs by mouth 2 (two) times daily.      No current facility-administered  medications for this visit.     Allergies: No Known Allergies  No past surgical history on file.  Social History   Socioeconomic History  . Marital status: Divorced    Spouse name: Not on file  . Number of children: Not on file  . Years of education: Not on file  . Highest education level: Not on file  Occupational History  . Not on file  Social Needs  . Financial resource strain: Not hard at all  . Food insecurity:    Worry: Never true    Inability: Never true  . Transportation needs:    Medical: No    Non-medical: No  Tobacco Use  . Smoking status: Never Smoker  . Smokeless tobacco: Never Used  Substance and Sexual Activity  . Alcohol use: No  . Drug use: No  . Sexual activity: Yes  Lifestyle  . Physical activity:    Days per week: 3 days    Minutes per session: 30 min  . Stress: Rather much  Relationships  . Social connections:    Talks on phone: Three times a week    Gets together: Three times a week    Attends religious service: Never    Active member of club or organization: No    Attends meetings of clubs  or organizations: Never    Relationship status: Divorced  Other Topics Concern  . Not on file  Social History Narrative  . Not on file    Family History  Problem Relation Age of Onset  . Heart disease Mother   . Hyperlipidemia Mother   . Mental illness Mother   . Diabetes Father   . Hypertension Sister   . Hyperlipidemia Brother   . Hypertension Brother   . Cancer Maternal Grandmother   . Cancer Maternal Grandfather   . Hyperlipidemia Maternal Grandfather   . Hypertension Maternal Grandfather   . Cancer Paternal Grandmother 56       breast cancer  . Hyperlipidemia Paternal Grandfather   . Hypertension Paternal Grandfather   . Cancer Maternal Aunt 40       breast cancer     ROS Review of Systems See HPI Constitution: No fevers or chills No malaise No diaphoresis Skin: No rash or itching Eyes: no blurry vision, no double  vision GU: no dysuria or hematuria Neuro: no dizziness or headaches all others reviewed and negative   Objective: Vitals:   11/03/17 1104 11/03/17 1108  BP: (!) 142/86 136/82  Pulse: 67   Resp: 14   Temp: 98.5 F (36.9 C)   SpO2: 96%   Weight: 162 lb 6.4 oz (73.7 kg)   Height: 5\' 4"  (1.626 m)     Physical Exam Physical Exam  Constitutional: He is oriented to person, place, and time. He appears well-developed and well-nourished.  HENT:  Head: Normocephalic and atraumatic.  Eyes: Conjunctivae and EOM are normal.  Cardiovascular: Normal rate, regular rhythm, normal heart sounds and intact distal pulses.  No murmur heard. Pulmonary/Chest: Effort normal and breath sounds normal. No stridor. No respiratory distress. He has no wheezes.  Neurological: He is alert and oriented to person, place, and time.  Skin: Skin is warm. Capillary refill takes less than 2 seconds.  Psychiatric: He has a normal mood and affect. His behavior is normal. Judgment and thought content normal.    Assessment and Plan Leslie was seen today for follow-up.  Diagnoses and all orders for this visit:  Essential hypertension-  Advised DASH diet Adjusted meds  Advised ambulatory bp monitoring -     amLODipine (NORVASC) 5 MG tablet; Take 2 half tablets by mouth once daily.     Arp

## 2017-11-03 NOTE — Patient Instructions (Addendum)
   If you have lab work done today you will be contacted with your lab results within the next 2 weeks.  If you have not heard from us then please contact us. The fastest way to get your results is to register for My Chart.   IF you received an x-ray today, you will receive an invoice from [facility]. Please contact [facility] at [phone] with questions or concerns regarding your invoice.   IF you received labwork today, you will receive an invoice from [facility]. Please contact [facility] at [phone] with questions or concerns regarding your invoice.   Our billing staff will not be able to assist you with questions regarding bills from these companies.  You will be contacted with the lab results as soon as they are available. The fastest way to get your results is to activate your My Chart account. Instructions are located on the last page of this paperwork. If you have not heard from us regarding the results in 2 weeks, please contact this office.      DASH Eating Plan DASH stands for "Dietary Approaches to Stop Hypertension." The DASH eating plan is a healthy eating plan that has been shown to reduce high blood pressure (hypertension). It may also reduce your risk for type 2 diabetes, heart disease, and stroke. The DASH eating plan may also help with weight loss. What are tips for following this plan? General guidelines  Avoid eating more than 2,300 mg (milligrams) of salt (sodium) a day. If you have hypertension, you may need to reduce your sodium intake to 1,500 mg a day.  Limit alcohol intake to no more than 1 drink a day for nonpregnant women and 2 drinks a day for men. One drink equals 12 oz of beer, 5 oz of wine, or 1 oz of hard liquor.  Work with your health care provider to maintain a healthy body weight or to lose weight. Ask what an ideal weight is for you.  Get at least 30 minutes of exercise that causes your heart to beat faster (aerobic  exercise) most days of the week. Activities may include walking, swimming, or biking.  Work with your health care provider or diet and nutrition specialist (dietitian) to adjust your eating plan to your individual calorie needs. Reading food labels  Check food labels for the amount of sodium per serving. Choose foods with less than 5 percent of the Daily Value of sodium. Generally, foods with less than 300 mg of sodium per serving fit into this eating plan.  To find whole grains, look for the word "whole" as the first word in the ingredient list. Shopping  Buy products labeled as "low-sodium" or "no salt added."  Buy fresh foods. Avoid canned foods and premade or frozen meals. Cooking  Avoid adding salt when cooking. Use salt-free seasonings or herbs instead of table salt or sea salt. Check with your health care provider or pharmacist before using salt substitutes.  Do not fry foods. Cook foods using healthy methods such as baking, boiling, grilling, and broiling instead.  Cook with heart-healthy oils, such as olive, canola, soybean, or sunflower oil. Meal planning   Eat a balanced diet that includes: ? 5 or more servings of fruits and vegetables each day. At each meal, try to fill half of your plate with fruits and vegetables. ? Up to 6-8 servings of whole grains each day. ? Less than 6 oz of lean meat, poultry, or fish each day. A 3-oz serving   of meat is about the same size as a deck of cards. One egg equals 1 oz. ? 2 servings of low-fat dairy each day. ? A serving of nuts, seeds, or beans 5 times each week. ? Heart-healthy fats. Healthy fats called Omega-3 fatty acids are found in foods such as flaxseeds and coldwater fish, like sardines, salmon, and mackerel.  Limit how much you eat of the following: ? Canned or prepackaged foods. ? Food that is high in trans fat, such as fried foods. ? Food that is high in saturated fat, such as fatty meat. ? Sweets, desserts, sugary drinks,  and other foods with added sugar. ? Full-fat dairy products.  Do not salt foods before eating.  Try to eat at least 2 vegetarian meals each week.  Eat more home-cooked food and less restaurant, buffet, and fast food.  When eating at a restaurant, ask that your food be prepared with less salt or no salt, if possible. What foods are recommended? The items listed may not be a complete list. Talk with your dietitian about what dietary choices are best for you. Grains Whole-grain or whole-wheat bread. Whole-grain or whole-wheat pasta. Brown rice. Oatmeal. Quinoa. Bulgur. Whole-grain and low-sodium cereals. Pita bread. Low-fat, low-sodium crackers. Whole-wheat flour tortillas. Vegetables Fresh or frozen vegetables (raw, steamed, roasted, or grilled). Low-sodium or reduced-sodium tomato and vegetable juice. Low-sodium or reduced-sodium tomato sauce and tomato paste. Low-sodium or reduced-sodium canned vegetables. Fruits All fresh, dried, or frozen fruit. Canned fruit in natural juice (without added sugar). Meat and other protein foods Skinless chicken or turkey. Ground chicken or turkey. Pork with fat trimmed off. Fish and seafood. Egg whites. Dried beans, peas, or lentils. Unsalted nuts, nut butters, and seeds. Unsalted canned beans. Lean cuts of beef with fat trimmed off. Low-sodium, lean deli meat. Dairy Low-fat (1%) or fat-free (skim) milk. Fat-free, low-fat, or reduced-fat cheeses. Nonfat, low-sodium ricotta or cottage cheese. Low-fat or nonfat yogurt. Low-fat, low-sodium cheese. Fats and oils Soft margarine without trans fats. Vegetable oil. Low-fat, reduced-fat, or light mayonnaise and salad dressings (reduced-sodium). Canola, safflower, olive, soybean, and sunflower oils. Avocado. Seasoning and other foods Herbs. Spices. Seasoning mixes without salt. Unsalted popcorn and pretzels. Fat-free sweets. What foods are not recommended? The items listed may not be a complete list. Talk with your  dietitian about what dietary choices are best for you. Grains Baked goods made with fat, such as croissants, muffins, or some breads. Dry pasta or rice meal packs. Vegetables Creamed or fried vegetables. Vegetables in a cheese sauce. Regular canned vegetables (not low-sodium or reduced-sodium). Regular canned tomato sauce and paste (not low-sodium or reduced-sodium). Regular tomato and vegetable juice (not low-sodium or reduced-sodium). Pickles. Olives. Fruits Canned fruit in a light or heavy syrup. Fried fruit. Fruit in cream or butter sauce. Meat and other protein foods Fatty cuts of meat. Ribs. Fried meat. Bacon. Sausage. Bologna and other processed lunch meats. Salami. Fatback. Hotdogs. Bratwurst. Salted nuts and seeds. Canned beans with added salt. Canned or smoked fish. Whole eggs or egg yolks. Chicken or turkey with skin. Dairy Whole or 2% milk, cream, and half-and-half. Whole or full-fat cream cheese. Whole-fat or sweetened yogurt. Full-fat cheese. Nondairy creamers. Whipped toppings. Processed cheese and cheese spreads. Fats and oils Butter. Stick margarine. Lard. Shortening. Ghee. Bacon fat. Tropical oils, such as coconut, palm kernel, or palm oil. Seasoning and other foods Salted popcorn and pretzels. Onion salt, garlic salt, seasoned salt, table salt, and sea salt. Worcestershire sauce. Tartar sauce. Barbecue sauce.   Teriyaki sauce. Soy sauce, including reduced-sodium. Steak sauce. Canned and packaged gravies. Fish sauce. Oyster sauce. Cocktail sauce. Horseradish that you find on the shelf. Ketchup. Mustard. Meat flavorings and tenderizers. Bouillon cubes. Hot sauce and Tabasco sauce. Premade or packaged marinades. Premade or packaged taco seasonings. Relishes. Regular salad dressings. Where to find more information:  National Heart, Lung, and Blood Institute: www.nhlbi.nih.gov  American Heart Association: www.heart.org Summary  The DASH eating plan is a healthy eating plan that has  been shown to reduce high blood pressure (hypertension). It may also reduce your risk for type 2 diabetes, heart disease, and stroke.  With the DASH eating plan, you should limit salt (sodium) intake to 2,300 mg a day. If you have hypertension, you may need to reduce your sodium intake to 1,500 mg a day.  When on the DASH eating plan, aim to eat more fresh fruits and vegetables, whole grains, lean proteins, low-fat dairy, and heart-healthy fats.  Work with your health care provider or diet and nutrition specialist (dietitian) to adjust your eating plan to your individual calorie needs. This information is not intended to replace advice given to you by your health care provider. Make sure you discuss any questions you have with your health care provider. Document Released: 12/19/2010 Document Revised: 12/24/2015 Document Reviewed: 12/24/2015 Elsevier Interactive Patient Education  2018 [employer]  

## 2017-12-05 ENCOUNTER — Ambulatory Visit: Payer: Medicare Other | Admitting: Family Medicine

## 2017-12-08 ENCOUNTER — Other Ambulatory Visit: Payer: Self-pay

## 2017-12-08 ENCOUNTER — Ambulatory Visit: Payer: Medicare Other | Admitting: Emergency Medicine

## 2017-12-08 ENCOUNTER — Encounter: Payer: Self-pay | Admitting: Emergency Medicine

## 2017-12-08 VITALS — BP 134/74 | HR 80 | Temp 98.1°F | Resp 16 | Ht 64.75 in | Wt 161.8 lb

## 2017-12-08 DIAGNOSIS — I10 Essential (primary) hypertension: Secondary | ICD-10-CM | POA: Diagnosis not present

## 2017-12-08 NOTE — Patient Instructions (Addendum)
   If you have lab work done today you will be contacted with your lab results within the next 2 weeks.  If you have not heard from us then please contact us. The fastest way to get your results is to register for My Chart.   IF you received an x-ray today, you will receive an invoice from [facility]. Please contact [facility] at [phone] with questions or concerns regarding your invoice.   IF you received labwork today, you will receive an invoice from [facility]. Please contact [facility] at [phone] with questions or concerns regarding your invoice.   Our billing staff will not be able to assist you with questions regarding bills from these companies.  You will be contacted with the lab results as soon as they are available. The fastest way to get your results is to activate your My Chart account. Instructions are located on the last page of this paperwork. If you have not heard from us regarding the results in 2 weeks, please contact this office.     Hypertension Hypertension, commonly called high blood pressure, is when the force of blood pumping through the arteries is too strong. The arteries are the blood vessels that carry blood from the heart throughout the body. Hypertension forces the heart to work harder to pump blood and may cause arteries to become narrow or stiff. Having untreated or uncontrolled hypertension can cause heart attacks, strokes, kidney disease, and other problems. A blood pressure reading consists of a higher number over a lower number. Ideally, your blood pressure should be below 120/80. The first ("top") number is called the systolic pressure. It is a measure of the pressure in your arteries as your heart beats. The second ("bottom") number is called the diastolic pressure. It is a measure of the pressure in your arteries as the heart relaxes. What are the causes? The cause of this condition is not known. What increases the risk? Some  risk factors for high blood pressure are under your control. Others are not. Factors you can change  Smoking.  Having type 2 diabetes mellitus, high cholesterol, or both.  Not getting enough exercise or physical activity.  Being overweight.  Having too much fat, sugar, calories, or salt (sodium) in your diet.  Drinking too much alcohol. Factors that are difficult or impossible to change  Having chronic kidney disease.  Having a family history of high blood pressure.  Age. Risk increases with age.  Race. You may be at higher risk if you are African-American.  Gender. Men are at higher risk than women before age 45. After age 65, women are at higher risk than men.  Having obstructive sleep apnea.  Stress. What are the signs or symptoms? Extremely high blood pressure (hypertensive crisis) may cause:  Headache.  Anxiety.  Shortness of breath.  Nosebleed.  Nausea and vomiting.  Severe chest pain.  Jerky movements you cannot control (seizures).  How is this diagnosed? This condition is diagnosed by measuring your blood pressure while you are seated, with your arm resting on a surface. The cuff of the blood pressure monitor will be placed directly against the skin of your upper arm at the level of your heart. It should be measured at least twice using the same arm. Certain conditions can cause a difference in blood pressure between your right and left arms. Certain factors can cause blood pressure readings to be lower or higher than normal (elevated) for a short period of time:  When   your blood pressure is higher when you are in a health care provider's office than when you are at home, this is called white coat hypertension. Most people with this condition do not need medicines.  When your blood pressure is higher at home than when you are in a health care provider's office, this is called masked hypertension. Most people with this condition may need medicines to control  blood pressure.  If you have a high blood pressure reading during one visit or you have normal blood pressure with other risk factors:  You may be asked to return on a different day to have your blood pressure checked again.  You may be asked to monitor your blood pressure at home for 1 week or longer.  If you are diagnosed with hypertension, you may have other blood or imaging tests to help your health care provider understand your overall risk for other conditions. How is this treated? This condition is treated by making healthy lifestyle changes, such as eating healthy foods, exercising more, and reducing your alcohol intake. Your health care provider may prescribe medicine if lifestyle changes are not enough to get your blood pressure under control, and if:  Your systolic blood pressure is above 130.  Your diastolic blood pressure is above 80.  Your personal target blood pressure may vary depending on your medical conditions, your age, and other factors. Follow these instructions at home: Eating and drinking  Eat a diet that is high in fiber and potassium, and low in sodium, added sugar, and fat. An example eating plan is called the DASH (Dietary Approaches to Stop Hypertension) diet. To eat this way: ? Eat plenty of fresh fruits and vegetables. Try to fill half of your plate at each meal with fruits and vegetables. ? Eat whole grains, such as whole wheat pasta, brown rice, or whole grain bread. Fill about one quarter of your plate with whole grains. ? Eat or drink low-fat dairy products, such as skim milk or low-fat yogurt. ? Avoid fatty cuts of meat, processed or cured meats, and poultry with skin. Fill about one quarter of your plate with lean proteins, such as fish, chicken without skin, beans, eggs, and tofu. ? Avoid premade and processed foods. These tend to be higher in sodium, added sugar, and fat.  Reduce your daily sodium intake. Most people with hypertension should eat less  than 1,500 mg of sodium a day.  Limit alcohol intake to no more than 1 drink a day for nonpregnant women and 2 drinks a day for men. One drink equals 12 oz of beer, 5 oz of wine, or 1 oz of hard liquor. Lifestyle  Work with your health care provider to maintain a healthy body weight or to lose weight. Ask what an ideal weight is for you.  Get at least 30 minutes of exercise that causes your heart to beat faster (aerobic exercise) most days of the week. Activities may include walking, swimming, or biking.  Include exercise to strengthen your muscles (resistance exercise), such as pilates or lifting weights, as part of your weekly exercise routine. Try to do these types of exercises for 30 minutes at least 3 days a week.  Do not use any products that contain nicotine or tobacco, such as cigarettes and e-cigarettes. If you need help quitting, ask your health care provider.  Monitor your blood pressure at home as told by your health care provider.  Keep all follow-up visits as told by your health care provider.   This is important. Medicines  Take over-the-counter and prescription medicines only as told by your health care provider. Follow directions carefully. Blood pressure medicines must be taken as prescribed.  Do not skip doses of blood pressure medicine. Doing this puts you at risk for problems and can make the medicine less effective.  Ask your health care provider about side effects or reactions to medicines that you should watch for. Contact a health care provider if:  You think you are having a reaction to a medicine you are taking.  You have headaches that keep coming back (recurring).  You feel dizzy.  You have swelling in your ankles.  You have trouble with your vision. Get help right away if:  You develop a severe headache or confusion.  You have unusual weakness or numbness.  You feel faint.  You have severe pain in your chest or abdomen.  You vomit  repeatedly.  You have trouble breathing. Summary  Hypertension is when the force of blood pumping through your arteries is too strong. If this condition is not controlled, it may put you at risk for serious complications.  Your personal target blood pressure may vary depending on your medical conditions, your age, and other factors. For most people, a normal blood pressure is less than 120/80.  Hypertension is treated with lifestyle changes, medicines, or a combination of both. Lifestyle changes include weight loss, eating a healthy, low-sodium diet, exercising more, and limiting alcohol. This information is not intended to replace advice given to you by your health care provider. Make sure you discuss any questions you have with your health care provider. Document Released: 12/30/2004 Document Revised: 11/28/2015 Document Reviewed: 11/28/2015 Elsevier Interactive Patient Education  2018 [employer]vier Inc.  

## 2017-12-08 NOTE — Assessment & Plan Note (Signed)
Well-controlled.  Continue present medication.  Follow-up in 3 to 6 months.

## 2017-12-08 NOTE — Progress Notes (Signed)
Kristina Johnston 47 y.o.   Chief Complaint  Patient presents with  . Hypertension    follow up   BP Readings from Last 3 Encounters:  12/08/17 134/74  11/03/17 136/82  09/12/17 140/89     HISTORY OF PRESENT ILLNESS: This is a 47 y.o. female with history of hypertension here for follow-up.  Takes amlodipine 5 mg daily.  Asymptomatic.  Doing well.  Has no complaints or medical concerns today.  HPI   Prior to Admission medications   Medication Sig Start Date End Date Taking? Authorizing Provider  amLODipine (NORVASC) 5 MG tablet Take 2 half tablets by mouth once daily. 11/03/17  Yes Stallings, Zoe A, MD  haloperidol (HALDOL) 2 MG/ML solution Take 2 mg by mouth daily.  08/25/17  Yes [provider]  ibuprofen (ADVIL,MOTRIN) 800 MG tablet Take 1 tablet (800 mg total) by mouth every 6 (six) hours as needed. 09/12/17  Yes Long, Wonda Olds, MD  Multiple Vitamins-Minerals (ADULT ONE DAILY GUMMIES PO) Take 2 tablets by mouth daily.   Yes [provider]  norelgestromin-ethinyl estradiol (ORTHO EVRA) 150-35 MCG/24HR transdermal patch Place 1 patch onto the skin once a week. Patient taking differently: Place 1 patch onto the skin every Sunday.  04/08/17  Yes Stallings, Zoe A, MD  triamcinolone cream (KENALOG) 0.1 % APPLY TO SKIN TWICE A DAY FOR NO MORE THAN 2 WEEKS Patient taking differently: Apply 1 application topically 2 (two) times daily as needed (Rash).  07/13/17  Yes Forrest Moron, MD  Valproate Sodium (DEPAKENE) 250 MG/5ML SOLN solution Take 2 mLs by mouth 2 (two) times daily.    Yes [provider]  aspirin 325 MG tablet Take 650 mg by mouth every 4 (four) hours as needed for moderate pain.    [provider]  calcium-vitamin D (OSCAL WITH D) 250-125 MG-UNIT tablet Take 1 tablet by mouth daily.    [provider]  traMADol (ULTRAM) 50 MG tablet Take 1 tablet (50 mg total) by mouth every 6 (six) hours as needed for severe pain. Patient not  taking: Reported on 12/08/2017 09/12/17   Long, Wonda Olds, MD    No Known Allergies  Patient Active Problem List   Diagnosis Date Noted  . Essential hypertension 06/06/2017  . Missed period 06/06/2017  . Dermatitis 06/06/2017  . Bipolar disorder ([place]) 04/08/2017  . Screen for STD (sexually transmitted disease) 04/08/2016  . Pap smear for cervical cancer screening 04/08/2016  . Screening for deficiency anemia 04/08/2016  . Screening for HIV (human immunodeficiency virus) 04/08/2016  . Elevated blood-pressure reading without diagnosis of hypertension 04/08/2016  . Low back pain 04/08/2016  . Depression 03/17/2011  . Annual physical exam 03/17/2011    Past Medical History:  Diagnosis Date  . Anxiety   . Depression   . Paranoid schizophrenia ([place])     No past surgical history on file.  Social History   Socioeconomic History  . Marital status: Divorced    Spouse name: Not on file  . Number of children: Not on file  . Years of education: Not on file  . Highest education level: Not on file  Occupational History  . Not on file  Social Needs  . Financial resource strain: Not hard at all  . Food insecurity:    Worry: Never true    Inability: Never true  . Transportation needs:    Medical: No    Non-medical: No  Tobacco Use  . Smoking status: Never Smoker  .  Smokeless tobacco: Never Used  Substance and Sexual Activity  . Alcohol use: No  . Drug use: No  . Sexual activity: Yes  Lifestyle  . Physical activity:    Days per week: 3 days    Minutes per session: 30 min  . Stress: Rather much  Relationships  . Social connections:    Talks on phone: Three times a week    Gets together: Three times a week    Attends religious service: Never    Active member of club or organization: No    Attends meetings of clubs or organizations: Never    Relationship status: Divorced  . Intimate partner violence:    Fear of current or ex partner: No    Emotionally abused: No     Physically abused: No    Forced sexual activity: No  Other Topics Concern  . Not on file  Social History Narrative  . Not on file    Family History  Problem Relation Age of Onset  . Heart disease Mother   . Hyperlipidemia Mother   . Mental illness Mother   . Diabetes Father   . Hypertension Sister   . Hyperlipidemia Brother   . Hypertension Brother   . Cancer Maternal Grandmother   . Cancer Maternal Grandfather   . Hyperlipidemia Maternal Grandfather   . Hypertension Maternal Grandfather   . Cancer Paternal Grandmother 58       breast cancer  . Hyperlipidemia Paternal Grandfather   . Hypertension Paternal Grandfather   . Cancer Maternal Aunt 40       breast cancer     Review of Systems  Constitutional: Negative.  Negative for chills and fever.  HENT: Negative.  Negative for sore throat.   Eyes: Negative.  Negative for blurred vision and double vision.  Respiratory: Negative.  Negative for cough and shortness of breath.   Cardiovascular: Negative.  Negative for chest pain and palpitations.  Gastrointestinal: Negative for abdominal pain, diarrhea, nausea and vomiting.  Genitourinary: Negative.   Musculoskeletal: Negative.   Skin: Negative.  Negative for rash.  Neurological: Negative.  Negative for dizziness and headaches.  Endo/Heme/Allergies: Negative.   All other systems reviewed and are negative.  Vitals:   12/08/17 0840  BP: 134/74  Pulse: 80  Resp: 16  Temp: 98.1 F (36.7 C)  SpO2: 98%     Physical Exam  Constitutional: She is oriented to person, place, and time. She appears well-developed and well-nourished.  HENT:  Head: Normocephalic and atraumatic.  Nose: Nose normal.  Mouth/Throat: Oropharynx is clear and moist.  Eyes: Pupils are equal, round, and reactive to light. Conjunctivae and EOM are normal.  Neck: Normal range of motion. Neck supple. No thyromegaly present.  Cardiovascular: Normal rate, regular rhythm and normal heart sounds.    Pulmonary/Chest: Effort normal and breath sounds normal.  Musculoskeletal: Normal range of motion.  Lymphadenopathy:    She has no cervical adenopathy.  Neurological: She is alert and oriented to person, place, and time. No sensory deficit. She exhibits normal muscle tone.  Skin: Skin is warm and dry. Capillary refill takes less than 2 seconds.  Psychiatric: She has a normal mood and affect. Her behavior is normal.  Vitals reviewed.   A total of 25 minutes was spent in the room with the patient, greater than 50% of which was in counseling/coordination of care regarding hypertension, treatment, medications, diet, home blood pressure measurements, and need for follow-up.   ASSESSMENT & PLAN: Essential hypertension Well-controlled.  Continue present medication.  Follow-up in 3 to 6 months.   Areej was seen today for hypertension.  Diagnoses and all orders for this visit:  Essential hypertension    Patient Instructions       If you have lab work done today you will be contacted with your lab results within the next 2 weeks.  If you have not heard from [place] then please contact us. The fastest way to get your results is to register for My Chart.   IF you received an x-ray today, you will receive an invoice from [facility]. Please contact [facility] at [phone] with questions or concerns regarding your invoice.   IF you received labwork today, you will receive an invoice from [facility]. Please contact [facility] at [phone] with questions or concerns regarding your invoice.   Our billing staff will not be able to assist you with questions regarding bills from these companies.  You will be contacted with the lab results as soon as they are available. The fastest way to get your results is to activate your My Chart account. Instructions are located on the last page of this paperwork. If you have not heard from [place] regarding the results in 2 weeks, please  contact this office.     Hypertension Hypertension, commonly called high blood pressure, is when the force of blood pumping through the arteries is too strong. The arteries are the blood vessels that carry blood from the heart throughout the body. Hypertension forces the heart to work harder to pump blood and may cause arteries to become narrow or stiff. Having untreated or uncontrolled hypertension can cause heart attacks, strokes, kidney disease, and other problems. A blood pressure reading consists of a higher number over a lower number. Ideally, your blood pressure should be below 120/80. The first ("top") number is called the systolic pressure. It is a measure of the pressure in your arteries as your heart beats. The second ("bottom") number is called the diastolic pressure. It is a measure of the pressure in your arteries as the heart relaxes. What are the causes? The cause of this condition is not known. What increases the risk? Some risk factors for high blood pressure are under your control. Others are not. Factors you can change  Smoking.  Having type 2 diabetes mellitus, high cholesterol, or both.  Not getting enough exercise or physical activity.  Being overweight.  Having too much fat, sugar, calories, or salt (sodium) in your diet.  Drinking too much alcohol. Factors that are difficult or impossible to change  Having chronic kidney disease.  Having a family history of high blood pressure.  Age. Risk increases with age.  Race. You may be at higher risk if you are African-American.  Gender. Men are at higher risk than women before age 57. After age 38, women are at higher risk than men.  Having obstructive sleep apnea.  Stress. What are the signs or symptoms? Extremely high blood pressure (hypertensive crisis) may cause:  Headache.  Anxiety.  Shortness of breath.  Nosebleed.  Nausea and vomiting.  Severe chest pain.  Jerky movements you cannot control  (seizures).  How is this diagnosed? This condition is diagnosed by measuring your blood pressure while you are seated, with your arm resting on a surface. The cuff of the blood pressure monitor will be placed directly against the skin of your upper arm at the level of your heart. It should be measured at least twice using the same arm.  Certain conditions can cause a difference in blood pressure between your right and left arms. Certain factors can cause blood pressure readings to be lower or higher than normal (elevated) for a short period of time:  When your blood pressure is higher when you are in a health care provider's office than when you are at home, this is called white coat hypertension. Most people with this condition do not need medicines.  When your blood pressure is higher at home than when you are in a health care provider's office, this is called masked hypertension. Most people with this condition may need medicines to control blood pressure.  If you have a high blood pressure reading during one visit or you have normal blood pressure with other risk factors:  You may be asked to return on a different day to have your blood pressure checked again.  You may be asked to monitor your blood pressure at home for 1 week or longer.  If you are diagnosed with hypertension, you may have other blood or imaging tests to help your health care provider understand your overall risk for other conditions. How is this treated? This condition is treated by making healthy lifestyle changes, such as eating healthy foods, exercising more, and reducing your alcohol intake. Your health care provider may prescribe medicine if lifestyle changes are not enough to get your blood pressure under control, and if:  Your systolic blood pressure is above 130.  Your diastolic blood pressure is above 80.  Your personal target blood pressure may vary depending on your medical conditions, your age, and other  factors. Follow these instructions at home: Eating and drinking  Eat a diet that is high in fiber and potassium, and low in sodium, added sugar, and fat. An example eating plan is called the DASH (Dietary Approaches to Stop Hypertension) diet. To eat this way: ? Eat plenty of fresh fruits and vegetables. Try to fill half of your plate at each meal with fruits and vegetables. ? Eat whole grains, such as whole wheat pasta, brown rice, or whole grain bread. Fill about one quarter of your plate with whole grains. ? Eat or drink low-fat dairy products, such as skim milk or low-fat yogurt. ? Avoid fatty cuts of meat, processed or cured meats, and poultry with skin. Fill about one quarter of your plate with lean proteins, such as fish, chicken without skin, beans, eggs, and tofu. ? Avoid premade and processed foods. These tend to be higher in sodium, added sugar, and fat.  Reduce your daily sodium intake. Most people with hypertension should eat less than 1,500 mg of sodium a day.  Limit alcohol intake to no more than 1 drink a day for nonpregnant women and 2 drinks a day for men. One drink equals 12 oz of beer, 5 oz of wine, or 1 oz of hard liquor. Lifestyle  Work with your health care provider to maintain a healthy body weight or to lose weight. Ask what an ideal weight is for you.  Get at least 30 minutes of exercise that causes your heart to beat faster (aerobic exercise) most days of the week. Activities may include walking, swimming, or biking.  Include exercise to strengthen your muscles (resistance exercise), such as pilates or lifting weights, as part of your weekly exercise routine. Try to do these types of exercises for 30 minutes at least 3 days a week.  Do not use any products that contain nicotine or tobacco, such as cigarettes and  e-cigarettes. If you need help quitting, ask your health care provider.  Monitor your blood pressure at home as told by your health care provider.  Keep  all follow-up visits as told by your health care provider. This is important. Medicines  Take over-the-counter and prescription medicines only as told by your health care provider. Follow directions carefully. Blood pressure medicines must be taken as prescribed.  Do not skip doses of blood pressure medicine. Doing this puts you at risk for problems and can make the medicine less effective.  Ask your health care provider about side effects or reactions to medicines that you should watch for. Contact a health care provider if:  You think you are having a reaction to a medicine you are taking.  You have headaches that keep coming back (recurring).  You feel dizzy.  You have swelling in your ankles.  You have trouble with your vision. Get help right away if:  You develop a severe headache or confusion.  You have unusual weakness or numbness.  You feel faint.  You have severe pain in your chest or abdomen.  You vomit repeatedly.  You have trouble breathing. Summary  Hypertension is when the force of blood pumping through your arteries is too strong. If this condition is not controlled, it may put you at risk for serious complications.  Your personal target blood pressure may vary depending on your medical conditions, your age, and other factors. For most people, a normal blood pressure is less than 120/80.  Hypertension is treated with lifestyle changes, medicines, or a combination of both. Lifestyle changes include weight loss, eating a healthy, low-sodium diet, exercising more, and limiting alcohol. This information is not intended to replace advice given to you by your health care provider. Make sure you discuss any questions you have with your health care provider. Document Released: 12/30/2004 Document Revised: 11/28/2015 Document Reviewed: 11/28/2015 Elsevier Interactive Patient Education  2018 [employer]vier Inc.      Agustina Caroli, MD [facility]

## 2018-03-31 ENCOUNTER — Other Ambulatory Visit: Payer: Self-pay | Admitting: Family Medicine

## 2018-03-31 DIAGNOSIS — I10 Essential (primary) hypertension: Secondary | ICD-10-CM

## 2018-04-06 ENCOUNTER — Other Ambulatory Visit: Payer: Self-pay | Admitting: *Deleted

## 2018-04-06 DIAGNOSIS — Z0000 Encounter for general adult medical examination without abnormal findings: Secondary | ICD-10-CM

## 2018-04-06 DIAGNOSIS — Z13220 Encounter for screening for lipoid disorders: Secondary | ICD-10-CM

## 2018-04-06 DIAGNOSIS — Z1329 Encounter for screening for other suspected endocrine disorder: Secondary | ICD-10-CM

## 2018-04-06 DIAGNOSIS — Z130 Encounter for screening for diseases of the blood and blood-forming organs and certain disorders involving the immune mechanism: Secondary | ICD-10-CM

## 2018-04-07 ENCOUNTER — Other Ambulatory Visit: Payer: Self-pay | Admitting: Family Medicine

## 2018-04-07 DIAGNOSIS — Z3041 Encounter for surveillance of contraceptive pills: Secondary | ICD-10-CM

## 2018-04-12 ENCOUNTER — Encounter: Payer: Medicare Other | Admitting: Family Medicine

## 2018-04-12 ENCOUNTER — Other Ambulatory Visit: Payer: Self-pay

## 2018-04-12 ENCOUNTER — Telehealth (INDEPENDENT_AMBULATORY_CARE_PROVIDER_SITE_OTHER): Payer: Medicare Other | Admitting: Family Medicine

## 2018-04-12 DIAGNOSIS — I10 Essential (primary) hypertension: Secondary | ICD-10-CM

## 2018-04-12 DIAGNOSIS — Z3045 Encounter for surveillance of transdermal patch hormonal contraceptive device: Secondary | ICD-10-CM | POA: Diagnosis not present

## 2018-04-12 MED ORDER — NORELGESTROMIN-ETH ESTRADIOL 150-35 MCG/24HR TD PTWK: 1.0000 | MEDICATED_PATCH | TRANSDERMAL | 12 refills | Status: DC

## 2018-04-12 MED ORDER — AMLODIPINE BESYLATE 5 MG PO TABS: ORAL_TABLET | ORAL | 3 refills | Status: DC

## 2018-04-12 NOTE — Progress Notes (Signed)
CC: birth control.  Pt requesting medication refill on ortho erva patch and amlodipine.

## 2018-04-12 NOTE — Progress Notes (Signed)
Telemedicine Encounter- SOAP NOTE Established Patient  This telephone encounter was conducted with the patient's (or proxy's) verbal consent via audio telecommunications: yes/no: Yes Patient was instructed to have this encounter in a suitably private space; and to only have persons present to whom they give permission to participate. In addition, patient identity was confirmed by use of name plus two identifiers (DOB and address).  I discussed the limitations, risks, security and privacy concerns of performing an evaluation and management service by telephone and the availability of in person appointments. I also discussed with the patient that there may be a patient responsible charge related to this service. The patient expressed understanding and agreed to proceed.  I spent a total of TIME; 0 MIN TO 60 MIN: 15 minutes talking with the patient or their proxy.  No chief complaint on file.   Subjective   Kristina Johnston is a 48 y.o. established patient. Telephone visit today for  HPI  She would like a refill of the patch Xulane No nausea, headache, chest pain, calf pain LMP 03/20/2018 She denies skin rash at the site of the patch and likes the patch She is due for refills She takes amlodipine for her blood pressure   Hypertension: Patient here for follow-up of elevated blood pressure. She is exercising and is adherent to low salt diet.  Blood pressure is well controlled at home. Cardiac symptoms none. Patient denies chest pain, chest pressure/discomfort, dyspnea, irregular heart beat, lower extremity edema and orthopnea.  Cardiovascular risk factors: hypertension. Use of agents associated with hypertension: none. History of target organ damage: none.  She took her amlodipine yesterday but not this morning  She checked her bp and her readings were 119/87, pulse 70  BP Readings from Last 3 Encounters:  12/08/17 134/74  11/03/17 136/82  09/12/17 140/89    Patient Active Problem  List   Diagnosis Date Noted  . Essential hypertension 06/06/2017  . Missed period 06/06/2017  . Dermatitis 06/06/2017  . Bipolar disorder ([place]) 04/08/2017  . Screen for STD (sexually transmitted disease) 04/08/2016  . Pap smear for cervical cancer screening 04/08/2016  . Screening for deficiency anemia 04/08/2016  . Screening for HIV (human immunodeficiency virus) 04/08/2016  . Elevated blood-pressure reading without diagnosis of hypertension 04/08/2016  . Low back pain 04/08/2016  . Depression 03/17/2011  . Annual physical exam 03/17/2011    Past Medical History:  Diagnosis Date  . Anxiety   . Depression   . Paranoid schizophrenia ([place])     Current Outpatient Medications  Medication Sig Dispense Refill  . amLODipine (NORVASC) 5 MG tablet TAKE 2 HALF TABLETS BY MOUTH EVERY DAY 90 tablet 0  . aspirin 325 MG tablet Take 650 mg by mouth every 4 (four) hours as needed for moderate pain.    . calcium-vitamin D (OSCAL WITH D) 250-125 MG-UNIT tablet Take 1 tablet by mouth daily.    . haloperidol (HALDOL) 2 MG/ML solution Take 2 mg by mouth daily.   12  . ibuprofen (ADVIL,MOTRIN) 800 MG tablet Take 1 tablet (800 mg total) by mouth every 6 (six) hours as needed. 21 tablet 0  . Multiple Vitamins-Minerals (ADULT ONE DAILY GUMMIES PO) Take 2 tablets by mouth daily.    . norelgestromin-ethinyl estradiol (ORTHO EVRA) 150-35 MCG/24HR transdermal patch Place 1 patch onto the skin once a week. (Patient taking differently: Place 1 patch onto the skin every Sunday. ) 3 patch 11  . traMADol (ULTRAM) 50 MG tablet Take 1 tablet (50  mg total) by mouth every 6 (six) hours as needed for severe pain. (Patient not taking: Reported on 12/08/2017) 15 tablet 0  . triamcinolone cream (KENALOG) 0.1 % APPLY TO SKIN TWICE A DAY FOR NO MORE THAN 2 WEEKS (Patient not taking: No sig reported) 30 g 0  . Valproate Sodium (DEPAKENE) 250 MG/5ML SOLN solution Take 2 mLs by mouth 2 (two) times daily.      No current  facility-administered medications for this visit.     No Known Allergies  Social History   Socioeconomic History  . Marital status: Divorced    Spouse name: Not on file  . Number of children: Not on file  . Years of education: Not on file  . Highest education level: Not on file  Occupational History  . Not on file  Social Needs  . Financial resource strain: Not hard at all  . Food insecurity:    Worry: Never true    Inability: Never true  . Transportation needs:    Medical: No    Non-medical: No  Tobacco Use  . Smoking status: Never Smoker  . Smokeless tobacco: Never Used  Substance and Sexual Activity  . Alcohol use: No  . Drug use: No  . Sexual activity: Yes  Lifestyle  . Physical activity:    Days per week: 3 days    Minutes per session: 30 min  . Stress: Rather much  Relationships  . Social connections:    Talks on phone: Three times a week    Gets together: Three times a week    Attends religious service: Never    Active member of club or organization: No    Attends meetings of clubs or organizations: Never    Relationship status: Divorced  . Intimate partner violence:    Fear of current or ex partner: No    Emotionally abused: No    Physically abused: No    Forced sexual activity: No  Other Topics Concern  . Not on file  Social History Narrative  . Not on file    ROS  Objective   Vitals as reported by the patient:  119/87, pulse 70  There were no vitals filed for this visit.  Diagnoses and all orders for this visit:  Essential hypertension- discussed continued adherence to DASH diet Advised that she may decrease dose by half if bp remains in the 110-140s/60-90s  Encounter for surveillance of transdermal patch hormonal contraceptive device- continued patch, pt stable with good bp     I discussed the assessment and treatment plan with the patient. The patient was provided an opportunity to ask questions and all were answered. The patient  agreed with the plan and demonstrated an understanding of the instructions.  The patient was advised to call back or seek an in-person evaluation if the symptoms worsen or if the condition fails to improve as anticipated.  I provided 15 minutes of non-face-to-face time during this encounter.  Forrest Moron, MD  Primary Care at [facility]

## 2018-05-11 ENCOUNTER — Encounter: Payer: Medicare Other | Admitting: Family Medicine

## 2018-10-12 ENCOUNTER — Ambulatory Visit: Payer: Medicare Other | Admitting: Family Medicine

## 2018-10-12 ENCOUNTER — Other Ambulatory Visit: Payer: Self-pay

## 2018-10-12 ENCOUNTER — Encounter: Payer: Self-pay | Admitting: Family Medicine

## 2018-10-12 VITALS — BP 116/70 | HR 76 | Temp 97.9°F | Resp 17 | Ht 64.75 in | Wt 165.8 lb

## 2018-10-12 DIAGNOSIS — Z5181 Encounter for therapeutic drug level monitoring: Secondary | ICD-10-CM

## 2018-10-12 DIAGNOSIS — F310 Bipolar disorder, current episode hypomanic: Secondary | ICD-10-CM | POA: Diagnosis not present

## 2018-10-12 DIAGNOSIS — N920 Excessive and frequent menstruation with regular cycle: Secondary | ICD-10-CM

## 2018-10-12 DIAGNOSIS — I10 Essential (primary) hypertension: Secondary | ICD-10-CM | POA: Diagnosis not present

## 2018-10-12 DIAGNOSIS — Z1329 Encounter for screening for other suspected endocrine disorder: Secondary | ICD-10-CM | POA: Diagnosis not present

## 2018-10-12 DIAGNOSIS — Z23 Encounter for immunization: Secondary | ICD-10-CM

## 2018-10-12 LAB — CMP14+EGFR
ALT: 9 IU/L (ref 0–32)
AST: 17 IU/L (ref 0–40)
Albumin/Globulin Ratio: 1.4 (ref 1.2–2.2)
Albumin: 3.9 g/dL (ref 3.8–4.8)
Alkaline Phosphatase: 49 IU/L (ref 39–117)
BUN/Creatinine Ratio: 12 (ref 9–23)
BUN: 11 mg/dL (ref 6–24)
Bilirubin Total: <0.2 mg/dL (ref 0.0–1.2)
CO2: 18 mmol/L — ABNORMAL LOW (ref 20–29)
Calcium: 9.1 mg/dL (ref 8.7–10.2)
Chloride: 105 mmol/L (ref 96–106)
Creatinine, Ser: 0.95 mg/dL (ref 0.57–1.00)
GFR calc Af Amer: 81 mL/min/{1.73_m2} (ref >59)
GFR calc non Af Amer: 71 mL/min/{1.73_m2} (ref >59)
Globulin, Total: 2.7 g/dL (ref 1.5–4.5)
Glucose: 90 mg/dL (ref 65–99)
Potassium: 4.3 mmol/L (ref 3.5–5.2)
Sodium: 140 mmol/L (ref 134–144)
Total Protein: 6.6 g/dL (ref 6.0–8.5)

## 2018-10-12 LAB — CBC
Hematocrit: 33.8 % — ABNORMAL LOW (ref 34.0–46.6)
Hemoglobin: 11.3 g/dL (ref 11.1–15.9)
MCH: 30.5 pg (ref 26.6–33.0)
MCHC: 33.4 g/dL (ref 31.5–35.7)
MCV: 91 fL (ref 79–97)
Platelets: 246 10*3/uL (ref 150–450)
RBC: 3.7 x10E6/uL — ABNORMAL LOW (ref 3.77–5.28)
RDW: 13.3 % (ref 11.7–15.4)
WBC: 4.3 10*3/uL (ref 3.4–10.8)

## 2018-10-12 LAB — LIPID PANEL
Chol/HDL Ratio: 2.5 ratio (ref 0.0–4.4)
Cholesterol, Total: 158 mg/dL (ref 100–199)
HDL: 64 mg/dL (ref >39)
LDL Chol Calc (NIH): 76 mg/dL (ref 0–99)
Triglycerides: 100 mg/dL (ref 0–149)
VLDL Cholesterol Cal: 18 mg/dL (ref 5–40)

## 2018-10-12 LAB — TSH: TSH: 2.86 u[IU]/mL (ref 0.450–4.500)

## 2018-10-12 NOTE — Progress Notes (Signed)
Established Patient Office Visit  Subjective:  Patient ID: Kristina Johnston, female    DOB: 05-24-1970  Age: 48 y.o. MRN: [id number]  CC:  Chief Complaint  Patient presents with  . discuss treatment/bloodwork done for psych refill    HPI Kristina Johnston presents for Hypertension: Patient here for follow-up of elevated blood pressure. She is exercising and is adherent to low salt diet.  Blood pressure is well controlled at home. Cardiac symptoms none. Patient denies chest pressure/discomfort, claudication, dyspnea, exertional chest pressure/discomfort and fatigue.  Cardiovascular risk factors: hypertension. Use of agents associated with hypertension: none. History of target organ damage: none.  Heavy menses Pt reports that she soaks a pad every 2 hours with clots. She is concerned about fibroids. She reports that she feels very dehydrated during her cycle of her periods.   Encounter for medication monitoring She is here also to get labs because of the Valproic acid and Haldol she takes for her Bipolar Disorder She denies hallucinations She is doing well on both meds She is no longer on lithium  Past Medical History:  Diagnosis Date  . Anxiety   . Depression   . Paranoid schizophrenia ([place])     No past surgical history on file.  Family History  Problem Relation Age of Onset  . Heart disease Mother   . Hyperlipidemia Mother   . Mental illness Mother   . Diabetes Father   . Hypertension Sister   . Hyperlipidemia Brother   . Hypertension Brother   . Cancer Maternal Grandmother   . Cancer Maternal Grandfather   . Hyperlipidemia Maternal Grandfather   . Hypertension Maternal Grandfather   . Cancer Paternal Grandmother 29       breast cancer  . Hyperlipidemia Paternal Grandfather   . Hypertension Paternal Grandfather   . Cancer Maternal Aunt 40       breast cancer    Social History   Socioeconomic History  . Marital status: Divorced    Spouse name: Not on file  .  Number of children: Not on file  . Years of education: Not on file  . Highest education level: Not on file  Occupational History  . Not on file  Social Needs  . Financial resource strain: Not hard at all  . Food insecurity    Worry: Never true    Inability: Never true  . Transportation needs    Medical: No    Non-medical: No  Tobacco Use  . Smoking status: Never Smoker  . Smokeless tobacco: Never Used  Substance and Sexual Activity  . Alcohol use: No  . Drug use: No  . Sexual activity: Yes  Lifestyle  . Physical activity    Days per week: 3 days    Minutes per session: 30 min  . Stress: Rather much  Relationships  . Social Herbalist on phone: Three times a week    Gets together: Three times a week    Attends religious service: Never    Active member of club or organization: No    Attends meetings of clubs or organizations: Never    Relationship status: Divorced  . Intimate partner violence    Fear of current or ex partner: No    Emotionally abused: No    Physically abused: No    Forced sexual activity: No  Other Topics Concern  . Not on file  Social History Narrative  . Not on file    Outpatient Medications Prior to Visit  Medication Sig Dispense Refill  . amLODipine (NORVASC) 5 MG tablet Take 1 tablet by Mouth daily 90 tablet 3  . aspirin 325 MG tablet Take 650 mg by mouth every 4 (four) hours as needed for moderate pain.    . calcium-vitamin D (OSCAL WITH D) 250-125 MG-UNIT tablet Take 1 tablet by mouth daily.    . haloperidol (HALDOL) 2 MG/ML solution Take 2 mg by mouth daily.   12  . ibuprofen (ADVIL,MOTRIN) 800 MG tablet Take 1 tablet (800 mg total) by mouth every 6 (six) hours as needed. 21 tablet 0  . Multiple Vitamins-Minerals (ADULT ONE DAILY GUMMIES PO) Take 2 tablets by mouth daily.    . norelgestromin-ethinyl estradiol Marilu Favre) 150-35 MCG/24HR transdermal patch Place 1 patch onto the skin once a week. 3 patch 12  . traMADol (ULTRAM) 50 MG  tablet Take 1 tablet (50 mg total) by mouth every 6 (six) hours as needed for severe pain. 15 tablet 0  . Valproate Sodium (DEPAKENE) 250 MG/5ML SOLN solution Take 2 mLs by mouth 2 (two) times daily.     Marland Kitchen triamcinolone cream (KENALOG) 0.1 % APPLY TO SKIN TWICE A DAY FOR NO MORE THAN 2 WEEKS (Patient not taking: Reported on 10/12/2018) 30 g 0   No facility-administered medications prior to visit.     No Known Allergies  ROS Review of Systems Review of Systems  Constitutional: Negative for activity change, appetite change, chills and fever.  HENT: Negative for congestion, nosebleeds, trouble swallowing and voice change.   Respiratory: Negative for cough, shortness of breath and wheezing.   Gastrointestinal: Negative for diarrhea, nausea and vomiting.  Genitourinary: Negative for difficulty urinating, dysuria, flank pain and hematuria.  Musculoskeletal: Negative for back pain, joint swelling and neck pain.  Neurological: Negative for dizziness, speech difficulty, light-headedness and numbness.  See HPI. All other review of systems negative.     Objective:    Physical Exam  BP 116/70 (BP Location: Left Arm, Patient Position: Sitting, Cuff Size: Normal)   Pulse 76   Temp 97.9 F (36.6 C) (Oral)   Resp 17   Ht 5' 4.75" (1.645 m)   Wt 165 lb 12.8 oz (75.2 kg)   LMP 10/07/2018   SpO2 98%   BMI 27.80 kg/m  Wt Readings from Last 3 Encounters:  10/12/18 165 lb 12.8 oz (75.2 kg)  12/08/17 161 lb 12.8 oz (73.4 kg)  11/03/17 162 lb 6.4 oz (73.7 kg)   Physical Exam  Constitutional: Oriented to person, place, and time. Appears well-developed and well-nourished.  HENT:  Head: Normocephalic and atraumatic.  Eyes: Conjunctivae and EOM are normal.  Cardiovascular: Normal rate, regular rhythm, normal heart sounds and intact distal pulses.  No murmur heard. Pulmonary/Chest: Effort normal and breath sounds normal. No stridor. No respiratory distress. Has no wheezes.  Neurological: Is alert  and oriented to person, place, and time.  Skin: Skin is warm. Capillary refill takes less than 2 seconds.  Psychiatric: Has a normal mood and affect. Behavior is normal. Judgment and thought content normal.    Health Maintenance Due  Topic Date Due  . INFLUENZA VACCINE  08/14/2018    There are no preventive care reminders to display for this patient.  Lab Results  Component Value Date   TSH 2.850 08/29/2017   Lab Results  Component Value Date   WBC 3.9 (L) 09/12/2017   HGB 12.6 09/12/2017   HCT 37.3 09/12/2017   MCV 91.0 09/12/2017   PLT 302 09/12/2017  Lab Results  Component Value Date   NA 142 09/12/2017   K 3.9 09/12/2017   CO2 29 09/12/2017   GLUCOSE 104 (H) 09/12/2017   BUN 14 09/12/2017   CREATININE 0.75 09/12/2017   BILITOT <0.2 08/29/2017   ALKPHOS 46 08/29/2017   AST 17 08/29/2017   ALT 13 08/29/2017   PROT 6.8 08/29/2017   ALBUMIN 3.9 08/29/2017   CALCIUM 8.9 09/12/2017   ANIONGAP 8 09/12/2017   Lab Results  Component Value Date   CHOL 163 04/08/2017   Lab Results  Component Value Date   HDL 68 04/08/2017   Lab Results  Component Value Date   LDLCALC 70 04/08/2017   Lab Results  Component Value Date   TRIG 127 04/08/2017   Lab Results  Component Value Date   CHOLHDL 2.4 04/08/2017   No results found for: HGBA1C    Assessment & Plan:   Problem List Items Addressed This Visit      Cardiovascular and Mediastinum   Essential hypertension - Patient's blood pressure is at goal of 139/89 or less. Condition is stable. Continue current medications and treatment plan. I recommend that you exercise for 30-45 minutes 5 days a week. I also recommend a balanced diet with fruits and vegetables every day, lean meats, and little fried foods. The DASH diet (you can find this online) is a good example of this.    Relevant Orders   Lipid panel     Other   Bipolar disorder ([place])  - stable on current meds   Relevant Orders   CMP14+EGFR    Other  Visit Diagnoses    Need for prophylactic vaccination and inoculation against influenza    -  Primary   Relevant Orders   Flu Vaccine QUAD 36+ mos IM   Screening for thyroid disorder       Menorrhagia with regular cycle    - advised pelvic ultrasound but patient declined at this time. She will wait until her next pap smear next year   Relevant Orders   TSH   CBC   Encounter for medication monitoring    - will notify patient of results   Relevant Orders   CMP14+EGFR   CBC      No orders of the defined types were placed in this encounter.   Follow-up: No follow-ups on file.    Forrest Moron, MD

## 2018-10-12 NOTE — Patient Instructions (Addendum)
If you have lab work done today you will be contacted with your lab results within the next 2 weeks.  If you have not heard from [place] then please contact us. The fastest way to get your results is to register for My Chart.   IF you received an x-ray today, you will receive an invoice from [facility]. Please contact [facility] at [phone] with questions or concerns regarding your invoice.   IF you received labwork today, you will receive an invoice from [facility]. Please contact [facility] at [phone] with questions or concerns regarding your invoice.   Our billing staff will not be able to assist you with questions regarding bills from these companies.  You will be contacted with the lab results as soon as they are available. The fastest way to get your results is to activate your My Chart account. Instructions are located on the last page of this paperwork. If you have not heard from [place] regarding the results in 2 weeks, please contact this office.     Abdominal or Pelvic Ultrasound An ultrasound is a test that uses sound waves to take pictures of the inside of the body. This is a safe and painless test that does not expose you to any X-rays. It is done using a handheld device (transducer) that is placed on your abdomen or pelvis and moved around. The transducer sends out sound waves that reflect off your tissues and organs to create images on a computer screen. An abdominal ultrasound takes pictures of the inside of the abdomen. A pelvic ultrasound takes pictures of the inside of the pelvis. An abdominal or pelvic ultrasound may be done to:  Check the shape or size of an organ.  Check for problems such as: ? Cysts. ? Masses. ? Inflammation. ? Kidney stones. ? Gallstones. Tell a health care provider about:  Any allergies you have.  All medicines you are taking, including vitamins, herbs, eye drops, creams, and over-the-counter medicines.  Any surgeries  you have had.  Any medical conditions you have.  Whether you are pregnant or may be pregnant. What are the risks? There are no known risks or complications from having this test. What happens before the procedure?  Follow instructions from your health care provider about eating or drinking before the test.  Wear clothing that is easily washable in case the gel used for the test gets on your clothes. What happens during the procedure?   You will lie on an exam table.  Your lower abdomen and pelvis will be exposed.  A gel will be applied to your skin. It may feel cool.  The transducer will be pressed on your abdomen or pelvis and moved back and forth, through the gel, over the area to be examined.  The transducer will take pictures. These will be displayed on one or more computer monitors that look like small TV screens.  You may be asked to change your position.  After the exam, the gel will be cleaned off. What happens after the procedure?  It is up to you to get your test results. Ask your health care provider, or the department that is doing the test, when your results will be ready.  Keep all follow-up visits as told by your health care provider. This is important. Summary  An ultrasound is a test that uses sound waves to take pictures of the inside of the body.  An abdominal or pelvic ultrasound may be done to check  for cysts, masses, inflammation, kidney stones, or gallstones.  During the procedure, a handheld device (transducer) will be placed on your abdomen or pelvis and moved around over the area to be examined.  Ask your health care provider, or the department that is doing the test, when your results will be ready. This information is not intended to replace advice given to you by your health care provider. Make sure you discuss any questions you have with your health care provider. Document Released: 12/28/1999 Document Revised: 07/27/2017 Document Reviewed:  07/27/2017 Elsevier Patient Education  [place].

## 2018-10-15 ENCOUNTER — Telehealth: Payer: Self-pay | Admitting: Family Medicine

## 2018-10-15 NOTE — Telephone Encounter (Signed)
Copied from [place] [id number]. Topic: General - Other >> Oct 15, 2018 11:27 AM Yvette Rack wrote: Reason for CRM: Pt stated she needs her most recent lab results to be faxed to Dr. Chucky May at [phone]

## 2018-10-19 NOTE — Telephone Encounter (Signed)
10/12/2018 labs sent via fax [phone] to Dr Chucky May as requested by patient.

## 2018-10-19 NOTE — Telephone Encounter (Signed)
Notified pt her request for labs to be sent to dr Toy Care has been taken care of.  Pt appreciative. Dgaddy, CMA

## 2019-01-09 IMAGING — CR DG CHEST 2V
2 series · 2 of 2 positions shown · non-contrast
Comparison: 07/08/2009

CLINICAL DATA: Pt states she had a fall at the end [DATE], and was
due to have teeth fillings in mid [place], Pt states that since then,
she has been sleeping 2-3 hours on average. Pt states she has been
having chest pain and right arm pain.

EXAM:
CHEST - 2 VIEW

[w chest pa]
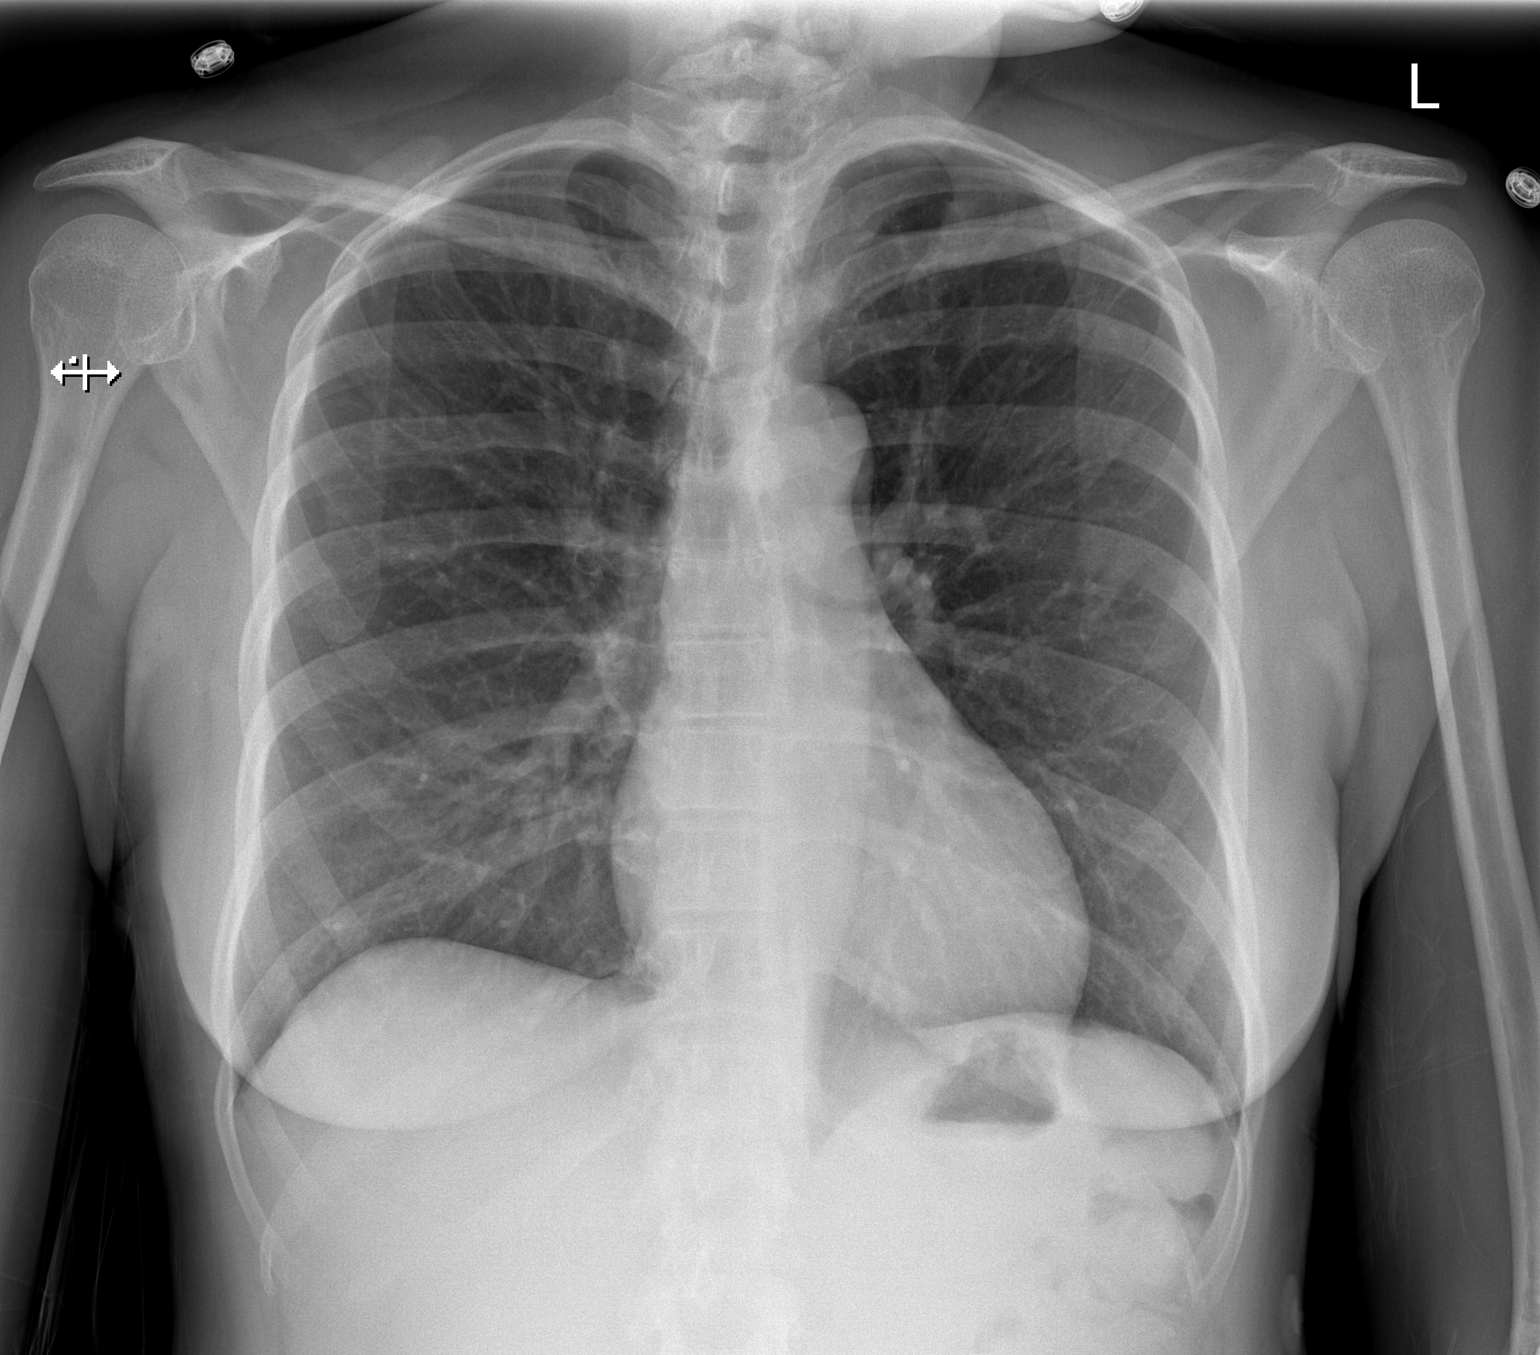

[w chest lat]
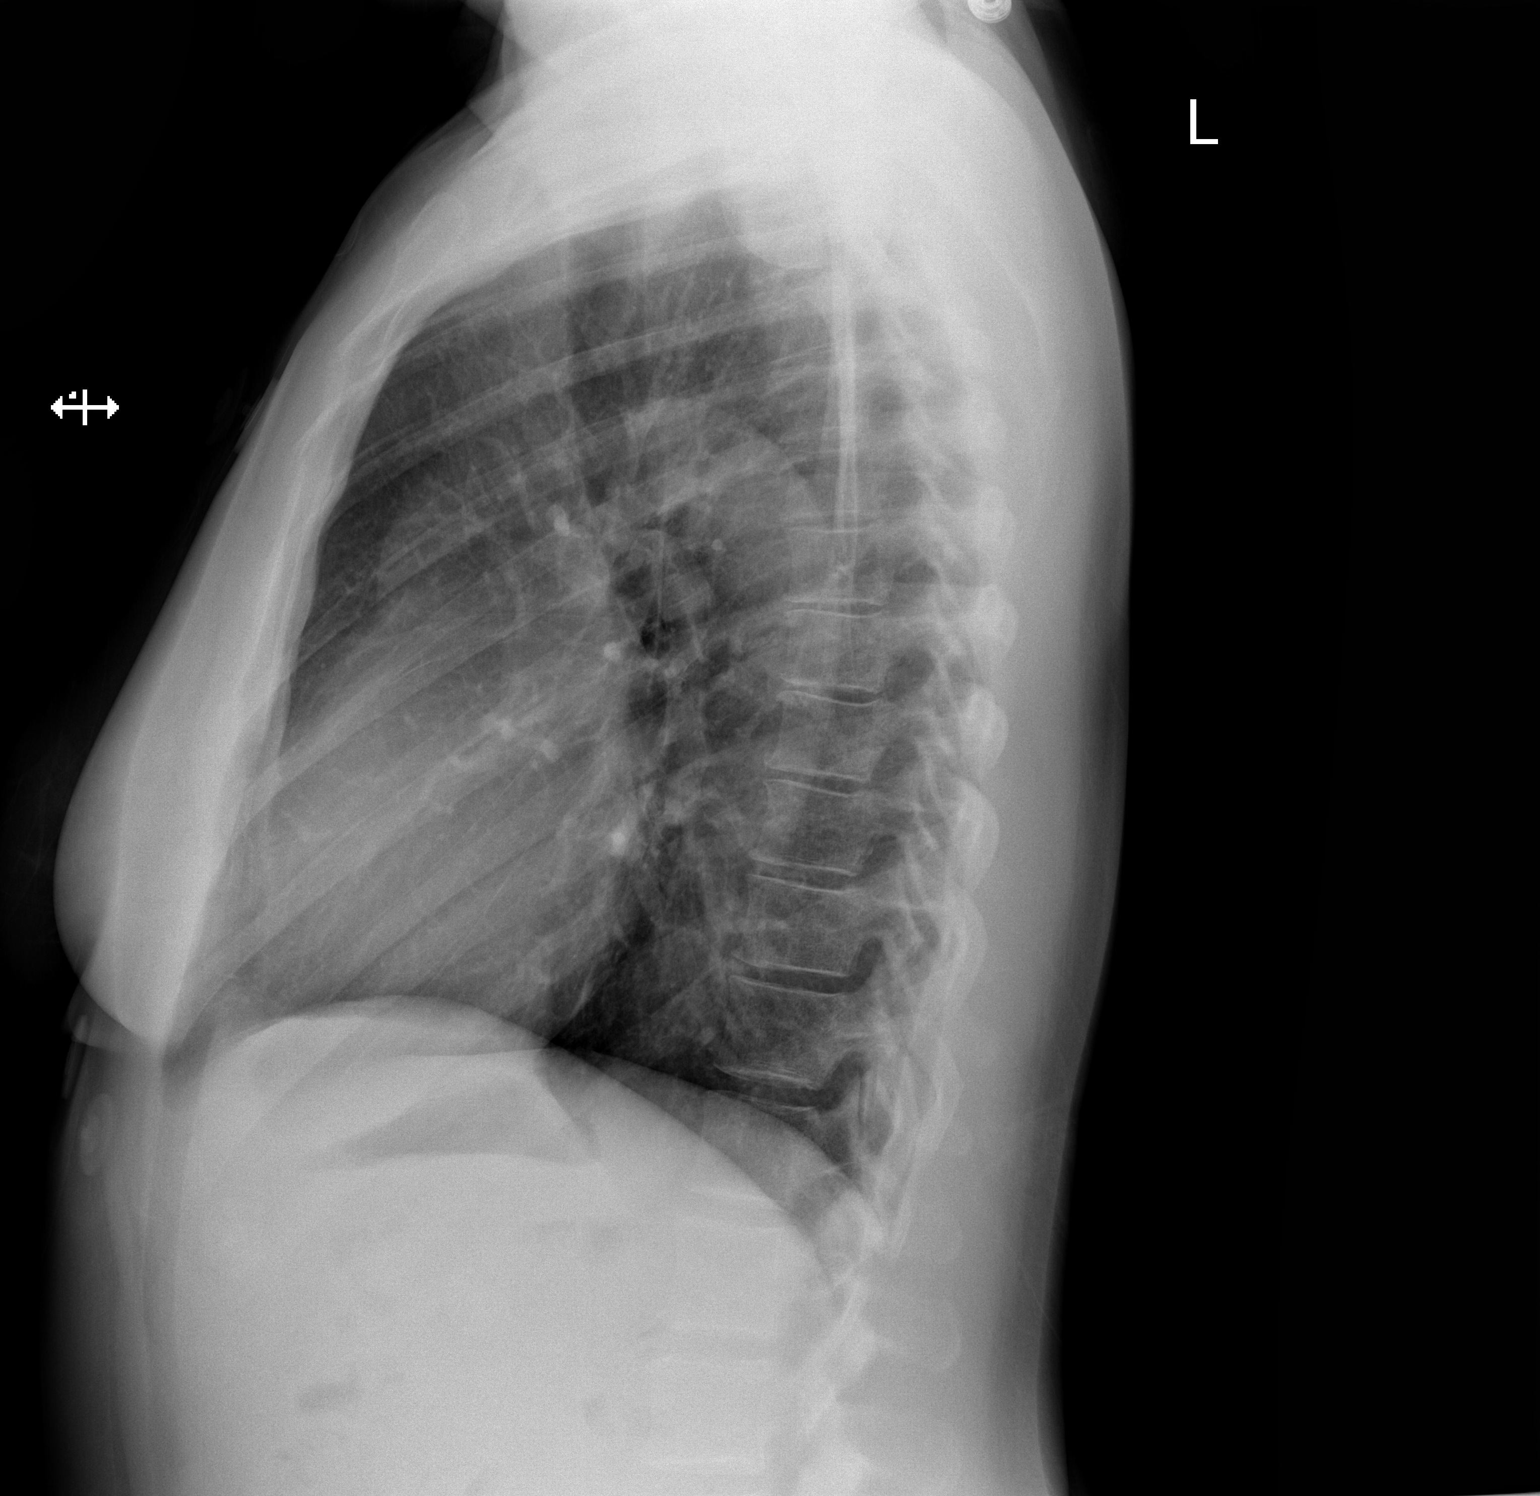

[2 of 2 positions shown; findings below may reference images not displayed]

FINDINGS: Normal mediastinum and cardiac silhouette. Normal pulmonary
vasculature. No evidence of effusion, infiltrate, or pneumothorax.
No acute bony abnormality.
IMPRESSION: Abnormal chest radiograph

## 2019-02-09 ENCOUNTER — Telehealth: Payer: Self-pay | Admitting: *Deleted

## 2019-02-09 NOTE — Telephone Encounter (Signed)
Schedule AWV.  

## 2019-02-11 ENCOUNTER — Ambulatory Visit (INDEPENDENT_AMBULATORY_CARE_PROVIDER_SITE_OTHER): Payer: Medicare Other | Admitting: Family Medicine

## 2019-02-11 ENCOUNTER — Other Ambulatory Visit: Payer: Self-pay | Admitting: *Deleted

## 2019-02-11 VITALS — BP 116/70 | Ht 65.0 in | Wt 165.0 lb

## 2019-02-11 DIAGNOSIS — Z1211 Encounter for screening for malignant neoplasm of colon: Secondary | ICD-10-CM

## 2019-02-11 DIAGNOSIS — Z0000 Encounter for general adult medical examination without abnormal findings: Secondary | ICD-10-CM | POA: Diagnosis not present

## 2019-02-11 NOTE — Progress Notes (Signed)
Presents today for [employer] Visit   Date of last exam: 10/12/2018  Interpreter used for this visit? No  I connected with  Genelle Bal on 02/11/19 by a telephone  and verified that I am speaking with the correct person using two identifiers.   I discussed the limitations of evaluation and management by telemedicine. The patient expressed understanding and agreed to proceed.    Patient Care Team: Forrest Moron, MD as PCP - General (Internal Medicine) Chucky May, MD as Consulting Physician (Psychiatry)   Other items to address today:  Discussed Eye/Dental  Discussed immunizations  Follow up scheduled 04-11-2018 [facility]    Other Screening:  Last lipid screening: 10/12/2018  ADVANCE DIRECTIVES: Discussed: yes On File: no Materials Provided:  no  Immunization status:  Immunization History  Administered Date(s) Administered  . Influenza-Unspecified 11/18/2017     Health Maintenance Due  Topic Date Due  . INFLUENZA VACCINE  08/14/2018  . MAMMOGRAM  11/27/2018  . COLONOSCOPY  11/27/2018  . PAP SMEAR-Modifier  04/09/2019     Functional Status Survey: Is the patient deaf or have difficulty hearing?: No Does the patient have difficulty seeing, even when wearing glasses/contacts?: No Does the patient have difficulty concentrating, remembering, or making decisions?: No Does the patient have difficulty walking or climbing stairs?: No Does the patient have difficulty dressing or bathing?: No Does the patient have difficulty doing errands alone such as visiting a doctor's office or shopping?: No   6CIT Screen 02/11/2019  What Year? 0 points  What month? 0 points  What time? 0 points  Count back from 20 0 points  Months in reverse 0 points  Repeat phrase 0 points  Total Score 0        Clinical Support from 02/11/2019 in [facility] at [place]  AUDIT-C Score  0       Home Environment:   Lives in one story home No scattered  rugs No grab bars Adequate lighting/no clutter No trouble climbing stairs  Timed warm up  N/A   Patient Active Problem List   Diagnosis Date Noted  . Essential hypertension 06/06/2017  . Missed period 06/06/2017  . Dermatitis 06/06/2017  . Bipolar disorder ([place]) 04/08/2017  . Screen for STD (sexually transmitted disease) 04/08/2016  . Pap smear for cervical cancer screening 04/08/2016  . Screening for deficiency anemia 04/08/2016  . Screening for HIV (human immunodeficiency virus) 04/08/2016  . Elevated blood-pressure reading without diagnosis of hypertension 04/08/2016  . Low back pain 04/08/2016  . Depression 03/17/2011  . Annual physical exam 03/17/2011     Past Medical History:  Diagnosis Date  . Anxiety   . Depression   . Paranoid schizophrenia ([place])      History reviewed. No pertinent surgical history.   Family History  Problem Relation Age of Onset  . Heart disease Mother   . Hyperlipidemia Mother   . Mental illness Mother   . Diabetes Father   . Hypertension Sister   . Hyperlipidemia Brother   . Hypertension Brother   . Cancer Maternal Grandmother   . Cancer Maternal Grandfather   . Hyperlipidemia Maternal Grandfather   . Hypertension Maternal Grandfather   . Cancer Paternal Grandmother 89       breast cancer  . Hyperlipidemia Paternal Grandfather   . Hypertension Paternal Grandfather   . Cancer Maternal Aunt 40       breast cancer     Social History   Socioeconomic History  .  Marital status: Divorced    Spouse name: Not on file  . Number of children: Not on file  . Years of education: Not on file  . Highest education level: Not on file  Occupational History  . Not on file  Tobacco Use  . Smoking status: Never Smoker  . Smokeless tobacco: Never Used  Substance and Sexual Activity  . Alcohol use: No  . Drug use: No  . Sexual activity: Yes  Other Topics Concern  . Not on file  Social History Narrative  . Not on file   Social  Determinants of Health   Financial Resource Strain:   . Difficulty of Paying Living Expenses: Not on file  Food Insecurity:   . Worried About Charity fundraiser in the Last Year: Not on file  . Ran Out of Food in the Last Year: Not on file  Transportation Needs:   . Lack of Transportation (Medical): Not on file  . Lack of Transportation (Non-Medical): Not on file  Physical Activity:   . Days of Exercise per Week: Not on file  . Minutes of Exercise per Session: Not on file  Stress:   . Feeling of Stress : Not on file  Social Connections:   . Frequency of Communication with Friends and Family: Not on file  . Frequency of Social Gatherings with Friends and Family: Not on file  . Attends Religious Services: Not on file  . Active Member of Clubs or Organizations: Not on file  . Attends Archivist Meetings: Not on file  . Marital Status: Not on file  Intimate Partner Violence:   . Fear of Current or Ex-Partner: Not on file  . Emotionally Abused: Not on file  . Physically Abused: Not on file  . Sexually Abused: Not on file     No Known Allergies   Prior to Admission medications   Medication Sig Start Date End Date Taking? Authorizing Provider  amLODipine (NORVASC) 5 MG tablet Take 1 tablet by Mouth daily 04/12/18  Yes Delia Chimes A, MD  aspirin 325 MG tablet Take 650 mg by mouth every 4 (four) hours as needed for moderate pain.   Yes [provider]  calcium-vitamin D (OSCAL WITH D) 250-125 MG-UNIT tablet Take 1 tablet by mouth daily.   Yes [provider]  haloperidol (HALDOL) 2 MG/ML solution Take 2 mg by mouth daily.  08/25/17  Yes [provider]  ibuprofen (ADVIL,MOTRIN) 800 MG tablet Take 1 tablet (800 mg total) by mouth every 6 (six) hours as needed. 09/12/17  Yes Long, Wonda Olds, MD  loratadine (CLARITIN) 5 MG/5ML syrup Take by mouth daily.   Yes [provider]  Multiple Vitamins-Minerals (ADULT ONE DAILY GUMMIES PO) Take 2  tablets by mouth daily.   Yes [provider]  norelgestromin-ethinyl estradiol Marilu Favre) 150-35 MCG/24HR transdermal patch Place 1 patch onto the skin once a week. 04/12/18  Yes Forrest Moron, MD  Valproate Sodium (DEPAKENE) 250 MG/5ML SOLN solution Take 2 mLs by mouth 2 (two) times daily.    Yes [provider]  traMADol (ULTRAM) 50 MG tablet Take 1 tablet (50 mg total) by mouth every 6 (six) hours as needed for severe pain. Patient not taking: Reported on 02/11/2019 09/12/17   Long, Wonda Olds, MD  triamcinolone cream (KENALOG) 0.1 % APPLY TO SKIN TWICE A DAY FOR NO MORE THAN 2 WEEKS Patient not taking: Reported on 10/12/2018 07/13/17   Forrest Moron, MD  Depression screen [facility] 2/9 02/11/2019 10/12/2018 04/12/2018 12/08/2017 11/03/2017  Decreased Interest 0 1 0 0 0  Down, Depressed, Hopeless 0 1 0 0 0  PHQ - 2 Score 0 2 0 0 0  Altered sleeping - 3 - - -  Tired, decreased energy - 2 - - -  Change in appetite - 0 - - -  Feeling bad or failure about yourself  - 0 - - -  Trouble concentrating - 3 - - -  Moving slowly or fidgety/restless - 1 - - -  Suicidal thoughts - 0 - - -  PHQ-9 Score - 11 - - -  Difficult doing work/chores - Somewhat difficult - - -     Fall Risk  02/11/2019 10/12/2018 04/12/2018 12/08/2017 11/03/2017  Falls in the past year? 0 0 1 1 No  Number falls in past yr: 0 0 0 0 -  Injury with Fall? 0 0 0 1 -  Comment - - scrapes and bruises per patient lump on the head -  Follow up Falls evaluation completed;Education provided Falls evaluation completed Falls evaluation completed - -      PHYSICAL EXAM: BP 116/70 Comment: previous visit taken  Ht 5\' 5"  (1.651 m)   Wt 165 lb (74.8 kg)   BMI 27.46 kg/m    Wt Readings from Last 3 Encounters:  02/11/19 165 lb (74.8 kg)  10/12/18 165 lb 12.8 oz (75.2 kg)  12/08/17 161 lb 12.8 oz (73.4 kg)       Education/Counseling provided regarding diet and exercise, prevention of chronic diseases,  smoking/tobacco cessation, if applicable, and reviewed "Covered Medicare Preventive Services."

## 2019-02-11 NOTE — Patient Instructions (Addendum)
Thank you for taking time to come for your Medicare Wellness Visit. I appreciate your ongoing commitment to your health goals. Please review the following plan we discussed and let me know if I can assist you in the future.  Leroy Kennedy LPN  Preventive Care 51-51 Years Old, Female Preventive care refers to visits with your health care provider and lifestyle choices that can promote health and wellness. This includes:  A yearly physical exam. This may also be called an annual well check.  Regular dental visits and eye exams.  Immunizations.  Screening for certain conditions.  Healthy lifestyle choices, such as eating a healthy diet, getting regular exercise, not using drugs or products that contain nicotine and tobacco, and limiting alcohol use. What can I expect for my preventive care visit? Physical exam Your health care provider will check your:  Height and weight. This may be used to calculate body mass index (BMI), which tells if you are at a healthy weight.  Heart rate and blood pressure.  Skin for abnormal spots. Counseling Your health care provider may ask you questions about your:  Alcohol, tobacco, and drug use.  Emotional well-being.  Home and relationship well-being.  Sexual activity.  Eating habits.  Work and work Statistician.  Method of birth control.  Menstrual cycle.  Pregnancy history. What immunizations do I need?  Influenza (flu) vaccine  This is recommended every year. Tetanus, diphtheria, and pertussis (Tdap) vaccine  You may need a Td booster every 10 years. Varicella (chickenpox) vaccine  You may need this if you have not been vaccinated. Zoster (shingles) vaccine  You may need this after age 25. Measles, mumps, and rubella (MMR) vaccine  You may need at least one dose of MMR if you were born in 1957 or later. You may also need a second dose. Pneumococcal conjugate (PCV13) vaccine  You may need this if you have certain conditions  and were not previously vaccinated. Pneumococcal polysaccharide (PPSV23) vaccine  You may need one or two doses if you smoke cigarettes or if you have certain conditions. Meningococcal conjugate (MenACWY) vaccine  You may need this if you have certain conditions. Hepatitis A vaccine  You may need this if you have certain conditions or if you travel or work in places where you may be exposed to hepatitis A. Hepatitis B vaccine  You may need this if you have certain conditions or if you travel or work in places where you may be exposed to hepatitis B. Haemophilus influenzae type b (Hib) vaccine  You may need this if you have certain conditions. Human papillomavirus (HPV) vaccine  If recommended by your health care provider, you may need three doses over 6 months. You may receive vaccines as individual doses or as more than one vaccine together in one shot (combination vaccines). Talk with your health care provider about the risks and benefits of combination vaccines. What tests do I need? Blood tests  Lipid and cholesterol levels. These may be checked every 5 years, or more frequently if you are over 51 years old.  Hepatitis C test.  Hepatitis B test. Screening  Lung cancer screening. You may have this screening every year starting at age 51 if you have a 30-pack-year history of smoking and currently smoke or have quit within the past 15 years.  Colorectal cancer screening. All adults should have this screening starting at age 51 and continuing until age 51. Your health care provider may recommend screening at age 51 if you are  at increased risk. You will have tests every 1-10 years, depending on your results and the type of screening test.  Diabetes screening. This is done by checking your blood sugar (glucose) after you have not eaten for a while (fasting). You may have this done every 1-3 years.  Mammogram. This may be done every 1-2 years. Talk with your health care provider  about when you should start having regular mammograms. This may depend on whether you have a family history of breast cancer.  BRCA-related cancer screening. This may be done if you have a family history of breast, ovarian, tubal, or peritoneal cancers.  Pelvic exam and Pap test. This may be done every 3 years starting at age 51. Starting at age 51, this may be done every 5 years if you have a Pap test in combination with an HPV test. Other tests  Sexually transmitted disease (STD) testing.  Bone density scan. This is done to screen for osteoporosis. You may have this scan if you are at high risk for osteoporosis. Follow these instructions at home: Eating and drinking  Eat a diet that includes fresh fruits and vegetables, whole grains, lean protein, and low-fat dairy.  Take vitamin and mineral supplements as recommended by your health care provider.  Do not drink alcohol if: ? Your health care provider tells you not to drink. ? You are pregnant, may be pregnant, or are planning to become pregnant.  If you drink alcohol: ? Limit how much you have to 0-1 drink a day. ? Be aware of how much alcohol is in your drink. In the [place] one drink equals one 12 oz bottle of beer (355 mL), one 5 oz glass of wine (148 mL), or one 1 oz glass of hard liquor (44 mL). Lifestyle  Take daily care of your teeth and gums.  Stay active. Exercise for at least 30 minutes on 5 or more days each week.  Do not use any products that contain nicotine or tobacco, such as cigarettes, e-cigarettes, and chewing tobacco. If you need help quitting, ask your health care provider.  If you are sexually active, practice safe sex. Use a condom or other form of birth control (contraception) in order to prevent pregnancy and STIs (sexually transmitted infections).  If told by your health care provider, take low-dose aspirin daily starting at age 63. What's next?  Visit your health care provider once a year for a well  check visit.  Ask your health care provider how often you should have your eyes and teeth checked.  Stay up to date on all vaccines. This information is not intended to replace advice given to you by your health care provider. Make sure you discuss any questions you have with your health care provider. Document Revised: 09/10/2017 Document Reviewed: 09/10/2017 Elsevier Patient Education  2020 [employer].

## 2019-03-09 ENCOUNTER — Other Ambulatory Visit: Payer: Self-pay | Admitting: Family Medicine

## 2019-04-01 ENCOUNTER — Other Ambulatory Visit: Payer: Self-pay | Admitting: Family Medicine

## 2019-04-01 DIAGNOSIS — I10 Essential (primary) hypertension: Secondary | ICD-10-CM

## 2019-04-04 ENCOUNTER — Other Ambulatory Visit: Payer: Self-pay | Admitting: Family Medicine

## 2019-04-04 MED ORDER — XULANE 150-35 MCG/24HR TD PTWK: 1.0000 | MEDICATED_PATCH | TRANSDERMAL | 12 refills | Status: DC

## 2019-04-04 NOTE — Telephone Encounter (Signed)
  Patient requesting norelgestromin-ethinyl estradiol Kristina Johnston) 150-35 MCG/24HR transdermal patch, informed patient please allow 48 to 72 hour turn around time. Patient states she's completely out and would like Rx sent in today  [id number] [place], [place] - [place] Phone:  [phone]  Fax:  [phone]

## 2019-04-04 NOTE — Telephone Encounter (Signed)
Requested Prescriptions  Pending Prescriptions Disp Refills  . norelgestromin-ethinyl estradiol Kristina Johnston) 150-35 MCG/24HR transdermal patch 3 patch 12    Sig: Place 1 patch onto the skin once a week.     OB/GYN:  Contraceptives Passed - 04/04/2019  8:21 AM      Passed - Last BP in normal range    BP Readings from Last 1 Encounters:  02/11/19 116/70         Passed - Valid encounter within last 12 months    Recent Outpatient Visits          1 month ago Medicare annual wellness visit, subsequent   Primary Care at [facility], [place] A, MD   5 months ago Need for prophylactic vaccination and inoculation against influenza   Primary Care at [facility], Arlie Solomons, MD   11 months ago Essential hypertension   Primary Care at [facility], Arlie Solomons, MD   1 year ago Essential hypertension   Primary Care at [facility], Ines Bloomer, MD   1 year ago Essential hypertension   Primary Care at [place], MD      Future Appointments            In 1 week Forrest Moron, MD Primary Care at [place], [facility]

## 2019-04-11 ENCOUNTER — Telehealth: Payer: Self-pay

## 2019-04-11 ENCOUNTER — Other Ambulatory Visit: Payer: Self-pay

## 2019-04-11 ENCOUNTER — Ambulatory Visit (INDEPENDENT_AMBULATORY_CARE_PROVIDER_SITE_OTHER): Payer: Medicare Other | Admitting: Family Medicine

## 2019-04-11 ENCOUNTER — Encounter: Payer: Self-pay | Admitting: Family Medicine

## 2019-04-11 VITALS — BP 110/78 | HR 98 | Temp 97.9°F | Ht 65.0 in | Wt 168.0 lb

## 2019-04-11 DIAGNOSIS — D509 Iron deficiency anemia, unspecified: Secondary | ICD-10-CM

## 2019-04-11 DIAGNOSIS — Z124 Encounter for screening for malignant neoplasm of cervix: Secondary | ICD-10-CM

## 2019-04-11 DIAGNOSIS — Z0000 Encounter for general adult medical examination without abnormal findings: Secondary | ICD-10-CM

## 2019-04-11 DIAGNOSIS — Z803 Family history of malignant neoplasm of breast: Secondary | ICD-10-CM

## 2019-04-11 DIAGNOSIS — Z0001 Encounter for general adult medical examination with abnormal findings: Secondary | ICD-10-CM | POA: Diagnosis not present

## 2019-04-11 DIAGNOSIS — Z1211 Encounter for screening for malignant neoplasm of colon: Secondary | ICD-10-CM

## 2019-04-11 DIAGNOSIS — Z1231 Encounter for screening mammogram for malignant neoplasm of breast: Secondary | ICD-10-CM

## 2019-04-11 DIAGNOSIS — I10 Essential (primary) hypertension: Secondary | ICD-10-CM | POA: Diagnosis not present

## 2019-04-11 MED ORDER — TRIAMCINOLONE ACETONIDE 0.1 % EX CREA: TOPICAL_CREAM | CUTANEOUS | 0 refills | Status: DC

## 2019-04-11 NOTE — Patient Instructions (Addendum)
We recommend that you schedule a mammogram for breast cancer screening. Typically, you do not need a referral to do this. Please contact a local imaging center to schedule your mammogram.   [facility] - [phone] or [phone]    Mammogram A mammogram is a low energy X-ray of the breasts that is done to check for abnormal changes. This procedure can screen for and detect any changes that may indicate breast cancer. Mammograms are regularly done on women. A man may have a mammogram if he has a lump or swelling in his breast. A mammogram can also identify other changes and variations in the breast, such as:  Inflammation of the breast tissue (mastitis).  An infected area that contains a collection of pus (abscess).  A fluid-filled sac (cyst).  Fibrocystic changes. This is when breast tissue becomes denser, which can make the tissue feel rope-like or uneven under the skin.  Tumors that are not cancerous (benign). Tell a health care provider:  About any allergies you have.  If you have breast implants.  If you have had previous breast disease, biopsy, or surgery.  If you are breastfeeding.  If you are younger than age 44.  If you have a family history of breast cancer.  Whether you are pregnant or may be pregnant. What are the risks? Generally, this is a safe procedure. However, problems may occur, including:  Exposure to radiation. Radiation levels are very low with this test.  The results being misinterpreted.  The need for further tests.  The inability of the mammogram to detect certain cancers. What happens before the procedure?  Schedule your test about 1-2 weeks after your menstrual period if you are still menstruating. This is usually when your breasts are the least tender.  If you have had a mammogram done at a different facility in the past, get the mammogram X-rays or have them sent to your current exam facility. The new and  old images will be compared.  Wash your breasts and underarms on the day of the test.  Do not wear deodorants, perfumes, lotions, or powders anywhere on your body on the day of the test.  Remove any jewelry from your neck.  Wear clothes that you can change into and out of easily. What happens during the procedure?   You will undress from the waist up and put on a gown that opens in the front.  You will stand in front of the X-ray machine.  Each breast will be placed between two plastic or glass plates. The plates will compress your breast for a few seconds. Try to stay as relaxed as possible during the procedure. This does not cause any harm to your breasts and any discomfort you feel will be very brief.  X-rays will be taken from different angles of each breast. The procedure may vary among health care providers and hospitals. What happens after the procedure?  The mammogram will be examined by a specialist (radiologist).  You may need to repeat certain parts of the test, depending on the quality of the images. This is commonly done if the radiologist needs a better view of the breast tissue.  You may resume your normal activities.  It is up to you to get the results of your procedure. Ask your health care provider, or the department that is doing the procedure, when your results will be ready. Summary  A mammogram is a low energy X-ray of the breasts that is  done to check for abnormal changes. A man may have a mammogram if he has a lump or swelling in his breast.  If you have had a mammogram done at a different facility in the past, get the mammogram X-rays or have them sent to your current exam facility in order to compare them.  Schedule your test about 1-2 weeks after your menstrual period if you are still menstruating.  For this test, each breast will be placed between two plastic or glass plates. The plates will compress your breast for a few seconds.  Ask when your test  results will be ready. Make sure you get your test results. This information is not intended to replace advice given to you by your health care provider. Make sure you discuss any questions you have with your health care provider. Document Revised: 08/20/2017 Document Reviewed: 08/20/2017 Elsevier Patient Education  [place].

## 2019-04-11 NOTE — Progress Notes (Signed)
Chief Complaint  Patient presents with  . Annual Exam    general health is good per pt.     Subjective:  Kristina Johnston is a 49 y.o. female here for a health maintenance visit.  Patient is established pt  Patient Active Problem List   Diagnosis Date Noted  . Essential hypertension 06/06/2017  . Missed period 06/06/2017  . Dermatitis 06/06/2017  . Bipolar disorder ([place]) 04/08/2017  . Screen for STD (sexually transmitted disease) 04/08/2016  . Pap smear for cervical cancer screening 04/08/2016  . Screening for deficiency anemia 04/08/2016  . Screening for HIV (human immunodeficiency virus) 04/08/2016  . Elevated blood-pressure reading without diagnosis of hypertension 04/08/2016  . Low back pain 04/08/2016  . Depression 03/17/2011  . Annual physical exam 03/17/2011    Past Medical History:  Diagnosis Date  . Anxiety   . Depression   . Paranoid schizophrenia ([place])     No past surgical history on file.   Outpatient Medications Prior to Visit  Medication Sig Dispense Refill  . amLODipine (NORVASC) 5 MG tablet TAKE 1 TABLET BY MOUTH DAILY 90 tablet 3  . aspirin 325 MG tablet Take 650 mg by mouth every 4 (four) hours as needed for moderate pain.    . calcium-vitamin D (OSCAL WITH D) 250-125 MG-UNIT tablet Take 1 tablet by mouth daily.    . ferrous sulfate 220 (44 Fe) MG/5ML solution Take 220 mg by mouth 2 (two) times daily.    . haloperidol (HALDOL) 2 MG/ML solution Take 2 mg by mouth daily.   12  . ibuprofen (ADVIL,MOTRIN) 800 MG tablet Take 1 tablet (800 mg total) by mouth every 6 (six) hours as needed. 21 tablet 0  . loratadine (CLARITIN) 5 MG/5ML syrup Take by mouth daily.    . Multiple Vitamins-Minerals (ADULT ONE DAILY GUMMIES PO) Take 2 tablets by mouth daily.    . norelgestromin-ethinyl estradiol Marilu Favre) 150-35 MCG/24HR transdermal patch Place 1 patch onto the skin once a week. 3 patch 12  . traMADol (ULTRAM) 50 MG tablet Take 1 tablet (50 mg total) by mouth every 6  (six) hours as needed for severe pain. 15 tablet 0  . Valproate Sodium (DEPAKENE) 250 MG/5ML SOLN solution Take 2 mLs by mouth 2 (two) times daily.     Marland Kitchen triamcinolone cream (KENALOG) 0.1 % APPLY TO SKIN TWICE A DAY FOR NO MORE THAN 2 WEEKS 30 g 0   No facility-administered medications prior to visit.    No Known Allergies   Family History  Problem Relation Age of Onset  . Heart disease Mother   . Hyperlipidemia Mother   . Mental illness Mother   . Diabetes Father   . Hypertension Sister   . Hyperlipidemia Brother   . Hypertension Brother   . Cancer Maternal Grandmother   . Cancer Maternal Grandfather   . Hyperlipidemia Maternal Grandfather   . Hypertension Maternal Grandfather   . Cancer Paternal Grandmother 36       breast cancer  . Hyperlipidemia Paternal Grandfather   . Hypertension Paternal Grandfather   . Cancer Maternal Aunt 40       breast cancer     Health Habits: Dental Exam: up to date Eye Exam: up to date Exercise: 3 times/week on average Current exercise activities: walking Diet: balanced   Social History   Socioeconomic History  . Marital status: Divorced    Spouse name: Not on file  . Number of children: Not on file  . Years of  education: Not on file  . Highest education level: Not on file  Occupational History  . Not on file  Tobacco Use  . Smoking status: Never Smoker  . Smokeless tobacco: Never Used  Substance and Sexual Activity  . Alcohol use: No  . Drug use: No  . Sexual activity: Yes  Other Topics Concern  . Not on file  Social History Narrative  . Not on file   Social Determinants of Health   Financial Resource Strain:   . Difficulty of Paying Living Expenses:   Food Insecurity:   . Worried About Charity fundraiser in the Last Year:   . Arboriculturist in the Last Year:   Transportation Needs:   . Film/video editor (Medical):   Marland Kitchen Lack of Transportation (Non-Medical):   Physical Activity:   . Days of Exercise per  Week:   . Minutes of Exercise per Session:   Stress:   . Feeling of Stress :   Social Connections:   . Frequency of Communication with Friends and Family:   . Frequency of Social Gatherings with Friends and Family:   . Attends Religious Services:   . Active Member of Clubs or Organizations:   . Attends Archivist Meetings:   Marland Kitchen Marital Status:   Intimate Partner Violence:   . Fear of Current or Ex-Partner:   . Emotionally Abused:   Marland Kitchen Physically Abused:   . Sexually Abused:    Social History   Substance and Sexual Activity  Alcohol Use No   Social History   Tobacco Use  Smoking Status Never Smoker  Smokeless Tobacco Never Used   Social History   Substance and Sexual Activity  Drug Use No    GYN: Sexual Health Menstrual status: regular menses LMP: No LMP recorded. Last pap smear: see HM section History of abnormal pap smears:  Sexually active: with female partner Current contraception: Ballinger Maintenance: See under health Maintenance activity for review of completion dates as well. Immunization History  Administered Date(s) Administered  . Influenza Inj Mdck Quad Pf 11/18/2017  . Influenza-Unspecified 11/18/2017  . PFIZER SARS-COV-2 Vaccination 03/31/2019      Depression Screen-PHQ2/9 Depression screen [facility] 2/9 04/11/2019 02/11/2019 10/12/2018 04/12/2018 12/08/2017  Decreased Interest 0 0 1 0 0  Down, Depressed, Hopeless 1 0 1 0 0  PHQ - 2 Score 1 0 2 0 0  Altered sleeping - - 3 - -  Tired, decreased energy - - 2 - -  Change in appetite - - 0 - -  Feeling bad or failure about yourself  - - 0 - -  Trouble concentrating - - 3 - -  Moving slowly or fidgety/restless - - 1 - -  Suicidal thoughts - - 0 - -  PHQ-9 Score - - 11 - -  Difficult doing work/chores - - Somewhat difficult - -       Depression Severity and Treatment Recommendations:  0-4= None  5-9= Mild / Treatment: Support, educate to call if worse; return in one month  10-14=  Moderate / Treatment: Support, watchful waiting; Antidepressant or Psycotherapy  15-19= Moderately severe / Treatment: Antidepressant OR Psychotherapy  >= 20 = Major depression, severe / Antidepressant AND Psychotherapy    Review of Systems   ROS  See HPI for ROS as well.   Review of Systems  Constitutional: Negative for activity change, appetite change, chills and fever.  HENT: Negative for congestion, nosebleeds, trouble swallowing and voice change.  Respiratory: Negative for cough, shortness of breath and wheezing.   Gastrointestinal: Negative for diarrhea, nausea and vomiting.  Genitourinary: Negative for difficulty urinating, dysuria, flank pain and hematuria.  Musculoskeletal: Negative for back pain, joint swelling and neck pain.  Neurological: Negative for dizziness, speech difficulty, light-headedness and numbness.  See HPI. All other review of systems negative.    Objective:   Vitals:   04/11/19 0925 04/11/19 0932  BP: 106/67 110/78  Pulse: 98   Temp: 97.9 F (36.6 C)   TempSrc: Temporal   SpO2: 96%   Weight: 168 lb (76.2 kg)   Height: '5\' 5"'$  (1.651 m)    Wt Readings from Last 3 Encounters:  04/11/19 168 lb (76.2 kg)  02/11/19 165 lb (74.8 kg)  10/12/18 165 lb 12.8 oz (75.2 kg)    Body mass index is 27.96 kg/m.  Physical Exam  BP 110/78   Pulse 98   Temp 97.9 F (36.6 C) (Temporal)   Ht '5\' 5"'$  (1.651 m)   Wt 168 lb (76.2 kg)   SpO2 96%   BMI 27.96 kg/m   General Appearance:    Alert, cooperative, no distress, appears stated age  Head:    Normocephalic, without obvious abnormality, atraumatic  Eyes:    conjunctiva/corneas clear, EOM's intact  Ears:    Normal TM's and external ear canals, both ears  Nose:   Nares normal, septum midline, mucosa normal, no drainage    or sinus tenderness  Neck:   Supple, symmetrical, trachea midline, no adenopathy;    thyroid:  no enlargement,tenderness,nodules  Back:     Symmetric, no curvature, ROM normal, no CVA  tenderness  Lungs:     Clear to auscultation bilaterally, respirations unlabored  Chest Wall:    No tenderness or deformity   Heart:    Regular rate and rhythm, S1 and S2 normal, no murmur, rub   or gallop  Breast Exam:    Declined, does regular breast exams  Abdomen:     Soft, non-tender, bowel sounds active all four quadrants,    no masses, no organomegaly  Genitalia:    Declined pap  Extremities:   Extremities normal, atraumatic, no cyanosis or edema  Pulses:   2+ and symmetric all extremities  Skin:   Skin color, texture, turgor normal, no rashes or lesions  Lymph nodes:   Cervical, supraclavicular, and axillary nodes normal  Neurologic:   CNII-XII intact, normal strength, sensation and reflexes    throughout      Assessment/Plan:   Patient was seen for a health maintenance exam.  Counseled the patient on health maintenance issues. Reviewed her health mainteance schedule and ordered appropriate tests (see orders.) Counseled on regular exercise and weight management. Recommend regular eye exams and dental cleaning.   The following issues were addressed today for health maintenance:   Danali was seen today for annual exam.  Diagnoses and all orders for this visit:  Encounter for health maintenance examination in adult- Women's Health Maintenance Plan Advised monthly breast exam and annual mammogram Advised dental exam every six months Discussed stress management Discussed pap smear screening guidelines  Essential hypertension - Patient's blood pressure is at goal of 139/89 or less. Condition is stable. Continue current medications and treatment plan. I recommend that you exercise for 30-45 minutes 5 days a week. I also recommend a balanced diet with fruits and vegetables every day, lean meats, and little fried foods. The DASH diet (you can find this online) is a good example of  this.  Advised pt to take amlodipine every other day if her bp stays in the same range.  She is  doing very well with monitoring and medication compliance. -     CMP14+EGFR  Screening for cervical cancer - last pap in 2018, pt elects to wait 5 years  Screening for colon cancer - -discussed options for colon cancer screenings, reviewed family history, discussed options for testing.  -pt decided to proceed with cologuard  -     Cologuard  Encounter for screening mammogram for malignant neoplasm of breast -     MM Digital Screening; Future  Family history of breast cancer -     MM Digital Screening; Future  Microcytic anemia- discussed iron deficiency -     CBC  Other orders -     triamcinolone cream (KENALOG) 0.1 %; APPLY TO SKIN TWICE A DAY FOR NO MORE THAN 2 WEEKS    No follow-ups on file.    Body mass index is 27.96 kg/m.:  Discussed the patient's BMI with patient. The BMI body mass index is 27.96 kg/m.     No future appointments.  There are no Patient Instructions on file for this visit.

## 2019-04-11 NOTE — Progress Notes (Signed)
Per pt her general health is good with no complaints. Pt is having some issues with her depression due to a life change of her son finishing collage soon. Pt hasn't had any issues with her hypertension since last visit. Pt is feeling light headed and dizzy at times when walking. Pt states she thinks its due to her anemia.

## 2019-04-11 NOTE — Telephone Encounter (Signed)
Pt stated her psychiatrists would like some medical records fax over. I informed the pt that the Doctor would have to fax over a request. Pt's doctor's name is Dr. Ginny Forth, [facility] Fax number is [phone]

## 2019-04-12 LAB — CMP14+EGFR
ALT: 11 IU/L (ref 0–32)
AST: 14 IU/L (ref 0–40)
Albumin/Globulin Ratio: 1.5 (ref 1.2–2.2)
Albumin: 4 g/dL (ref 3.8–4.8)
Alkaline Phosphatase: 42 IU/L (ref 39–117)
BUN/Creatinine Ratio: 11 (ref 9–23)
BUN: 11 mg/dL (ref 6–24)
Bilirubin Total: <0.2 mg/dL (ref 0.0–1.2)
CO2: 19 mmol/L — ABNORMAL LOW (ref 20–29)
Calcium: 9.2 mg/dL (ref 8.7–10.2)
Chloride: 104 mmol/L (ref 96–106)
Creatinine, Ser: 0.97 mg/dL (ref 0.57–1.00)
GFR calc Af Amer: 79 mL/min/{1.73_m2} (ref >59)
GFR calc non Af Amer: 68 mL/min/{1.73_m2} (ref >59)
Globulin, Total: 2.7 g/dL (ref 1.5–4.5)
Glucose: 88 mg/dL (ref 65–99)
Potassium: 4 mmol/L (ref 3.5–5.2)
Sodium: 139 mmol/L (ref 134–144)
Total Protein: 6.7 g/dL (ref 6.0–8.5)

## 2019-04-12 LAB — CBC
Hematocrit: 37.6 % (ref 34.0–46.6)
Hemoglobin: 12.8 g/dL (ref 11.1–15.9)
MCH: 31.6 pg (ref 26.6–33.0)
MCHC: 34 g/dL (ref 31.5–35.7)
MCV: 93 fL (ref 79–97)
Platelets: 304 10*3/uL (ref 150–450)
RBC: 4.05 x10E6/uL (ref 3.77–5.28)
RDW: 13.1 % (ref 11.7–15.4)
WBC: 3.8 10*3/uL (ref 3.4–10.8)

## 2019-05-24 ENCOUNTER — Ambulatory Visit (INDEPENDENT_AMBULATORY_CARE_PROVIDER_SITE_OTHER): Payer: Medicare Other | Admitting: Emergency Medicine

## 2019-05-24 ENCOUNTER — Other Ambulatory Visit: Payer: Self-pay

## 2019-05-24 ENCOUNTER — Encounter: Payer: Self-pay | Admitting: Emergency Medicine

## 2019-05-24 VITALS — BP 116/76 | HR 80 | Temp 98.1°F | Resp 16 | Ht 65.0 in | Wt 170.0 lb

## 2019-05-24 DIAGNOSIS — Z7689 Persons encountering health services in other specified circumstances: Secondary | ICD-10-CM

## 2019-05-24 DIAGNOSIS — I10 Essential (primary) hypertension: Secondary | ICD-10-CM | POA: Diagnosis not present

## 2019-05-24 DIAGNOSIS — F3176 Bipolar disorder, in full remission, most recent episode depressed: Secondary | ICD-10-CM | POA: Diagnosis not present

## 2019-05-24 NOTE — Progress Notes (Signed)
BP Readings from Last 3 Encounters:  05/24/19 116/76  04/11/19 110/78  02/11/19 116/70   Wt Readings from Last 3 Encounters:  05/24/19 170 lb (77.1 kg)  04/11/19 168 lb (76.2 kg)  02/11/19 165 lb (74.8 kg)   Kristina Johnston 49 y.o.   Chief Complaint  Patient presents with  . Establish Care    and review labs 04/11/2019, per pt    HISTORY OF PRESENT ILLNESS: This is a 49 y.o. female here to establish care with me.  Used to see Dr. Nolon Rod. Problem list reviewed with patient. Has a history of hypertension and bipolar disorder.  Doing well no complaints. Recent blood work done last March reviewed with patient.  Normal CBC and CMP. Compliant with medications. Sees psychiatrist on a regular basis for her bipolar disorder.  Taking haloperidol and valproic acid. Takes amlodipine for her hypertension. Has no complaints or medical concerns today.  HPI   Prior to Admission medications   Medication Sig Start Date End Date Taking? Authorizing Provider  amLODipine (NORVASC) 5 MG tablet TAKE 1 TABLET BY MOUTH DAILY 04/01/19  Yes Stallings, Zoe A, MD  Ascorbic Acid (VITAMIN C PO) Take by mouth daily.   Yes [provider]  calcium-vitamin D (OSCAL WITH D) 250-125 MG-UNIT tablet Take 1 tablet by mouth daily.   Yes [provider]  ferrous sulfate 220 (44 Fe) MG/5ML solution Take 220 mg by mouth 2 (two) times daily. 03/28/19  Yes [provider]  haloperidol (HALDOL) 2 MG/ML solution Take 2 mg by mouth daily.  08/25/17  Yes [provider]  ibuprofen (ADVIL,MOTRIN) 800 MG tablet Take 1 tablet (800 mg total) by mouth every 6 (six) hours as needed. 09/12/17  Yes Long, Wonda Olds, MD  loratadine (CLARITIN) 5 MG/5ML syrup Take by mouth daily.   Yes [provider]  Multiple Vitamins-Minerals (ADULT ONE DAILY GUMMIES PO) Take 2 tablets by mouth daily.   Yes [provider]  Multiple Vitamins-Minerals (ZINC PO) Take by mouth daily.   Yes [provider]  norelgestromin-ethinyl estradiol Marilu Favre) 150-35 MCG/24HR transdermal patch Place 1 patch onto the skin once a week. 04/04/19  Yes Stallings, Zoe A, MD  Potassium (POTASSIMIN PO) Take by mouth daily.   Yes [provider]  triamcinolone cream (KENALOG) 0.1 % APPLY TO SKIN TWICE A DAY FOR NO MORE THAN 2 WEEKS 04/11/19  Yes Delia Chimes A, MD  Valproate Sodium (DEPAKENE) 250 MG/5ML SOLN solution Take 2 mLs by mouth 2 (two) times daily.    Yes [provider]  aspirin 325 MG tablet Take 650 mg by mouth every 4 (four) hours as needed for moderate pain.    [provider]  traMADol (ULTRAM) 50 MG tablet Take 1 tablet (50 mg total) by mouth every 6 (six) hours as needed for severe pain. Patient not taking: Reported on 05/24/2019 09/12/17   Long, Wonda Olds, MD    No Known Allergies  Patient Active Problem List   Diagnosis Date Noted  . Essential hypertension 06/06/2017  . Bipolar disorder ([place]) 04/08/2017  . Depression 03/17/2011    Past Medical History:  Diagnosis Date  . Anxiety   . Depression   . Paranoid schizophrenia ([place])     History reviewed. No pertinent surgical history.  Social History   Socioeconomic History  . Marital status: Divorced    Spouse name: Not on file  . Number of children: Not on file  . Years of education: Not on file  .  Highest education level: Not on file  Occupational History  . Not on file  Tobacco Use  . Smoking status: Never Smoker  . Smokeless tobacco: Never Used  Substance and Sexual Activity  . Alcohol use: No  . Drug use: No  . Sexual activity: Yes  Other Topics Concern  . Not on file  Social History Narrative  . Not on file   Social Determinants of Health   Financial Resource Strain:   . Difficulty of Paying Living Expenses:   Food Insecurity:   . Worried About Charity fundraiser in the Last Year:   . Arboriculturist in the Last Year:   Transportation Needs:   . Film/video editor  (Medical):   Marland Kitchen Lack of Transportation (Non-Medical):   Physical Activity:   . Days of Exercise per Week:   . Minutes of Exercise per Session:   Stress:   . Feeling of Stress :   Social Connections:   . Frequency of Communication with Friends and Family:   . Frequency of Social Gatherings with Friends and Family:   . Attends Religious Services:   . Active Member of Clubs or Organizations:   . Attends Archivist Meetings:   Marland Kitchen Marital Status:   Intimate Partner Violence:   . Fear of Current or Ex-Partner:   . Emotionally Abused:   Marland Kitchen Physically Abused:   . Sexually Abused:     Family History  Problem Relation Age of Onset  . Heart disease Mother   . Hyperlipidemia Mother   . Mental illness Mother   . Diabetes Father   . Hypertension Sister   . Hyperlipidemia Brother   . Hypertension Brother   . Cancer Maternal Grandmother   . Cancer Maternal Grandfather   . Hyperlipidemia Maternal Grandfather   . Hypertension Maternal Grandfather   . Cancer Paternal Grandmother 21       breast cancer  . Hyperlipidemia Paternal Grandfather   . Hypertension Paternal Grandfather   . Cancer Maternal Aunt 40       breast cancer     Review of Systems  Constitutional: Negative.  Negative for chills and fever.  HENT: Negative.  Negative for congestion and sore throat.   Respiratory: Negative.  Negative for cough and shortness of breath.   Cardiovascular: Negative.  Negative for chest pain and palpitations.  Gastrointestinal: Negative.  Negative for abdominal pain, blood in stool, melena, nausea and vomiting.  Genitourinary: Negative.  Negative for dysuria and hematuria.  Musculoskeletal: Negative.  Negative for back pain, myalgias and neck pain.  Skin: Negative.  Negative for rash.  Neurological: Negative.  Negative for dizziness and headaches.  All other systems reviewed and are negative.  Today's Vitals   05/24/19 0920  BP: 116/76  Pulse: 80  Resp: 16  Temp: 98.1 F (36.7  C)  TempSrc: Temporal  SpO2: 99%  Weight: 170 lb (77.1 kg)  Height: 5\' 5"  (1.651 m)   Body mass index is 28.29 kg/m.   Physical Exam Vitals reviewed.  Constitutional:      Appearance: Normal appearance.  HENT:     Head: Normocephalic.  Eyes:     Extraocular Movements: Extraocular movements intact.     Conjunctiva/sclera: Conjunctivae normal.     Pupils: Pupils are equal, round, and reactive to light.  Cardiovascular:     Rate and Rhythm: Normal rate and regular rhythm.     Pulses: Normal pulses.     Heart sounds: Normal heart sounds.  Pulmonary:     Effort: Pulmonary effort is normal.     Breath sounds: Normal breath sounds.  Musculoskeletal:        General: Normal range of motion.     Cervical back: Normal range of motion and neck supple.  Skin:    General: Skin is warm and dry.     Capillary Refill: Capillary refill takes less than 2 seconds.  Neurological:     General: No focal deficit present.     Mental Status: She is alert and oriented to person, place, and time.  Psychiatric:        Mood and Affect: Mood normal.        Behavior: Behavior normal.      ASSESSMENT & PLAN: Esmie was seen today for establish care.  Diagnoses and all orders for this visit:  Essential hypertension  Encounter to establish care  Bipolar disorder, in full remission, most recent episode depressed [facility]    Patient Instructions       If you have lab work done today you will be contacted with your lab results within the next 2 weeks.  If you have not heard from [place] then please contact us. The fastest way to get your results is to register for My Chart.   IF you received an x-ray today, you will receive an invoice from [facility]. Please contact [facility] at [phone] with questions or concerns regarding your invoice.   IF you received labwork today, you will receive an invoice from [facility]. Please contact [facility] at [phone] with questions or  concerns regarding your invoice.   Our billing staff will not be able to assist you with questions regarding bills from these companies.  You will be contacted with the lab results as soon as they are available. The fastest way to get your results is to activate your My Chart account. Instructions are located on the last page of this paperwork. If you have not heard from [place] regarding the results in 2 weeks, please contact this office.     Health Maintenance, Female Adopting a healthy lifestyle and getting preventive care are important in promoting health and wellness. Ask your health care provider about:  The right schedule for you to have regular tests and exams.  Things you can do on your own to prevent diseases and keep yourself healthy. What should I know about diet, weight, and exercise? Eat a healthy diet   Eat a diet that includes plenty of vegetables, fruits, low-fat dairy products, and lean protein.  Do not eat a lot of foods that are high in solid fats, added sugars, or sodium. Maintain a healthy weight Body mass index (BMI) is used to identify weight problems. It estimates body fat based on height and weight. Your health care provider can help determine your BMI and help you achieve or maintain a healthy weight. Get regular exercise Get regular exercise. This is one of the most important things you can do for your health. Most adults should:  Exercise for at least 150 minutes each week. The exercise should increase your heart rate and make you sweat (moderate-intensity exercise).  Do strengthening exercises at least twice a week. This is in addition to the moderate-intensity exercise.  Spend less time sitting. Even light physical activity can be beneficial. Watch cholesterol and blood lipids Have your blood tested for lipids and cholesterol at 51 years of age, then have this test every 5 years. Have your cholesterol levels checked more often  if:  Your lipid or  cholesterol levels are high.  You are older than 51 years of age.  You are at high risk for heart disease. What should I know about cancer screening? Depending on your health history and family history, you may need to have cancer screening at various ages. This may include screening for:  Breast cancer.  Cervical cancer.  Colorectal cancer.  Skin cancer.  Lung cancer. What should I know about heart disease, diabetes, and high blood pressure? Blood pressure and heart disease  High blood pressure causes heart disease and increases the risk of stroke. This is more likely to develop in people who have high blood pressure readings, are of African descent, or are overweight.  Have your blood pressure checked: ? Every 3-5 years if you are 11-43 years of age. ? Every year if you are 65 years old or older. Diabetes Have regular diabetes screenings. This checks your fasting blood sugar level. Have the screening done:  Once every three years after age 8 if you are at a normal weight and have a low risk for diabetes.  More often and at a younger age if you are overweight or have a high risk for diabetes. What should I know about preventing infection? Hepatitis B If you have a higher risk for hepatitis B, you should be screened for this virus. Talk with your health care provider to find out if you are at risk for hepatitis B infection. Hepatitis C Testing is recommended for:  Everyone born from [birth year] through 1967.  Anyone with known risk factors for hepatitis C. Sexually transmitted infections (STIs)  Get screened for STIs, including gonorrhea and chlamydia, if: ? You are sexually active and are younger than 51 years of age. ? You are older than 51 years of age and your health care provider tells you that you are at risk for this type of infection. ? Your sexual activity has changed since you were last screened, and you are at increased risk for chlamydia or gonorrhea. Ask your  health care provider if you are at risk.  Ask your health care provider about whether you are at high risk for HIV. Your health care provider may recommend a prescription medicine to help prevent HIV infection. If you choose to take medicine to prevent HIV, you should first get tested for HIV. You should then be tested every 3 months for as long as you are taking the medicine. Pregnancy  If you are about to stop having your period (premenopausal) and you may become pregnant, seek counseling before you get pregnant.  Take 400 to 800 micrograms (mcg) of folic acid every day if you become pregnant.  Ask for birth control (contraception) if you want to prevent pregnancy. Osteoporosis and menopause Osteoporosis is a disease in which the bones lose minerals and strength with aging. This can result in bone fractures. If you are 61 years old or older, or if you are at risk for osteoporosis and fractures, ask your health care provider if you should:  Be screened for bone loss.  Take a calcium or vitamin D supplement to lower your risk of fractures.  Be given hormone replacement therapy (HRT) to treat symptoms of menopause. Follow these instructions at home: Lifestyle  Do not use any products that contain nicotine or tobacco, such as cigarettes, e-cigarettes, and chewing tobacco. If you need help quitting, ask your health care provider.  Do not use street drugs.  Do not share needles.  Ask  your health care provider for help if you need support or information about quitting drugs. Alcohol use  Do not drink alcohol if: ? Your health care provider tells you not to drink. ? You are pregnant, may be pregnant, or are planning to become pregnant.  If you drink alcohol: ? Limit how much you use to 0-1 drink a day. ? Limit intake if you are breastfeeding.  Be aware of how much alcohol is in your drink. In the [place] one drink equals one 12 oz bottle of beer (355 mL), one 5 oz glass of wine (148  mL), or one 1 oz glass of hard liquor (44 mL). General instructions  Schedule regular health, dental, and eye exams.  Stay current with your vaccines.  Tell your health care provider if: ? You often feel depressed. ? You have ever been abused or do not feel safe at home. Summary  Adopting a healthy lifestyle and getting preventive care are important in promoting health and wellness.  Follow your health care provider's instructions about healthy diet, exercising, and getting tested or screened for diseases.  Follow your health care provider's instructions on monitoring your cholesterol and blood pressure. This information is not intended to replace advice given to you by your health care provider. Make sure you discuss any questions you have with your health care provider. Document Revised: 12/23/2017 Document Reviewed: 12/23/2017 Elsevier Patient Education  2020 Elsevier Inc.      Agustina Caroli, MD [facility]

## 2019-05-24 NOTE — Patient Instructions (Addendum)
   If you have lab work done today you will be contacted with your lab results within the next 2 weeks.  If you have not heard from us then please contact us. The fastest way to get your results is to register for My Chart.   IF you received an x-ray today, you will receive an invoice from [facility]. Please contact [facility] at [phone] with questions or concerns regarding your invoice.   IF you received labwork today, you will receive an invoice from [facility]. Please contact [facility] at [phone] with questions or concerns regarding your invoice.   Our billing staff will not be able to assist you with questions regarding bills from these companies.  You will be contacted with the lab results as soon as they are available. The fastest way to get your results is to activate your My Chart account. Instructions are located on the last page of this paperwork. If you have not heard from us regarding the results in 2 weeks, please contact this office.       Health Maintenance, Female Adopting a healthy lifestyle and getting preventive care are important in promoting health and wellness. Ask your health care provider about:  The right schedule for you to have regular tests and exams.  Things you can do on your own to prevent diseases and keep yourself healthy. What should I know about diet, weight, and exercise? Eat a healthy diet   Eat a diet that includes plenty of vegetables, fruits, low-fat dairy products, and lean protein.  Do not eat a lot of foods that are high in solid fats, added sugars, or sodium. Maintain a healthy weight Body mass index (BMI) is used to identify weight problems. It estimates body fat based on height and weight. Your health care provider can help determine your BMI and help you achieve or maintain a healthy weight. Get regular exercise Get regular exercise. This is one of the most important things you can do for your health. Most  adults should:  Exercise for at least 150 minutes each week. The exercise should increase your heart rate and make you sweat (moderate-intensity exercise).  Do strengthening exercises at least twice a week. This is in addition to the moderate-intensity exercise.  Spend less time sitting. Even light physical activity can be beneficial. Watch cholesterol and blood lipids Have your blood tested for lipids and cholesterol at 51 years of age, then have this test every 5 years. Have your cholesterol levels checked more often if:  Your lipid or cholesterol levels are high.  You are older than 51 years of age.  You are at high risk for heart disease. What should I know about cancer screening? Depending on your health history and family history, you may need to have cancer screening at various ages. This may include screening for:  Breast cancer.  Cervical cancer.  Colorectal cancer.  Skin cancer.  Lung cancer. What should I know about heart disease, diabetes, and high blood pressure? Blood pressure and heart disease  High blood pressure causes heart disease and increases the risk of stroke. This is more likely to develop in people who have high blood pressure readings, are of African descent, or are overweight.  Have your blood pressure checked: ? Every 3-5 years if you are 18-39 years of age. ? Every year if you are 40 years old or older. Diabetes Have regular diabetes screenings. This checks your fasting blood sugar level. Have the screening done:  Once   every three years after age 40 if you are at a normal weight and have a low risk for diabetes.  More often and at a younger age if you are overweight or have a high risk for diabetes. What should I know about preventing infection? Hepatitis B If you have a higher risk for hepatitis B, you should be screened for this virus. Talk with your health care provider to find out if you are at risk for hepatitis B infection. Hepatitis  C Testing is recommended for:  Everyone born from 1945 through 1965.  Anyone with known risk factors for hepatitis C. Sexually transmitted infections (STIs)  Get screened for STIs, including gonorrhea and chlamydia, if: ? You are sexually active and are younger than 51 years of age. ? You are older than 51 years of age and your health care provider tells you that you are at risk for this type of infection. ? Your sexual activity has changed since you were last screened, and you are at increased risk for chlamydia or gonorrhea. Ask your health care provider if you are at risk.  Ask your health care provider about whether you are at high risk for HIV. Your health care provider may recommend a prescription medicine to help prevent HIV infection. If you choose to take medicine to prevent HIV, you should first get tested for HIV. You should then be tested every 3 months for as long as you are taking the medicine. Pregnancy  If you are about to stop having your period (premenopausal) and you may become pregnant, seek counseling before you get pregnant.  Take 400 to 800 micrograms (mcg) of folic acid every day if you become pregnant.  Ask for birth control (contraception) if you want to prevent pregnancy. Osteoporosis and menopause Osteoporosis is a disease in which the bones lose minerals and strength with aging. This can result in bone fractures. If you are 65 years old or older, or if you are at risk for osteoporosis and fractures, ask your health care provider if you should:  Be screened for bone loss.  Take a calcium or vitamin D supplement to lower your risk of fractures.  Be given hormone replacement therapy (HRT) to treat symptoms of menopause. Follow these instructions at home: Lifestyle  Do not use any products that contain nicotine or tobacco, such as cigarettes, e-cigarettes, and chewing tobacco. If you need help quitting, ask your health care provider.  Do not use street  drugs.  Do not share needles.  Ask your health care provider for help if you need support or information about quitting drugs. Alcohol use  Do not drink alcohol if: ? Your health care provider tells you not to drink. ? You are pregnant, may be pregnant, or are planning to become pregnant.  If you drink alcohol: ? Limit how much you use to 0-1 drink a day. ? Limit intake if you are breastfeeding.  Be aware of how much alcohol is in your drink. In the [place] one drink equals one 12 oz bottle of beer (355 mL), one 5 oz glass of wine (148 mL), or one 1 oz glass of hard liquor (44 mL). General instructions  Schedule regular health, dental, and eye exams.  Stay current with your vaccines.  Tell your health care provider if: ? You often feel depressed. ? You have ever been abused or do not feel safe at home. Summary  Adopting a healthy lifestyle and getting preventive care are important in promoting health and   wellness.  Follow your health care provider's instructions about healthy diet, exercising, and getting tested or screened for diseases.  Follow your health care provider's instructions on monitoring your cholesterol and blood pressure. This information is not intended to replace advice given to you by your health care provider. Make sure you discuss any questions you have with your health care provider. Document Revised: 12/23/2017 Document Reviewed: 12/23/2017 Elsevier Patient Education  2020 Elsevier Inc.  

## 2019-10-03 ENCOUNTER — Other Ambulatory Visit: Payer: Self-pay | Admitting: Emergency Medicine

## 2019-10-03 DIAGNOSIS — Z1231 Encounter for screening mammogram for malignant neoplasm of breast: Secondary | ICD-10-CM

## 2019-10-11 ENCOUNTER — Ambulatory Visit: Payer: Medicare Other | Admitting: Emergency Medicine

## 2019-10-12 ENCOUNTER — Other Ambulatory Visit: Payer: Self-pay

## 2019-10-12 ENCOUNTER — Ambulatory Visit
Admission: RE | Admit: 2019-10-12 | Discharge: 2019-10-12 | Disposition: A | Discharge status: Home / Self Care | Payer: Medicare Other | Source: Ambulatory Visit | Attending: Emergency Medicine | Admitting: Emergency Medicine

## 2019-10-12 ENCOUNTER — Ambulatory Visit: Payer: Medicare Other

## 2019-10-12 DIAGNOSIS — Z1231 Encounter for screening mammogram for malignant neoplasm of breast: Secondary | ICD-10-CM

## 2019-11-29 ENCOUNTER — Ambulatory Visit: Payer: Medicare Other | Admitting: Emergency Medicine

## 2019-11-29 ENCOUNTER — Encounter: Payer: Self-pay | Admitting: Emergency Medicine

## 2019-11-29 ENCOUNTER — Other Ambulatory Visit: Payer: Self-pay

## 2019-11-29 VITALS — BP 130/75 | HR 93 | Temp 98.3°F | Resp 16 | Ht 65.0 in | Wt 175.0 lb

## 2019-11-29 DIAGNOSIS — F3174 Bipolar disorder, in full remission, most recent episode manic: Secondary | ICD-10-CM | POA: Diagnosis not present

## 2019-11-29 DIAGNOSIS — I10 Essential (primary) hypertension: Secondary | ICD-10-CM

## 2019-11-29 MED ORDER — TRIAMCINOLONE ACETONIDE 0.1 % EX CREA: TOPICAL_CREAM | CUTANEOUS | 0 refills | Status: AC

## 2019-11-29 MED ORDER — AMLODIPINE BESYLATE 5 MG PO TABS: 5.0000 mg | ORAL_TABLET | Freq: Every day | ORAL | 3 refills | Status: DC

## 2019-11-29 NOTE — Patient Instructions (Addendum)
   If you have lab work done today you will be contacted with your lab results within the next 2 weeks.  If you have not heard from us then please contact us. The fastest way to get your results is to register for My Chart.   IF you received an x-ray today, you will receive an invoice from [facility]. Please contact [facility] at [phone] with questions or concerns regarding your invoice.   IF you received labwork today, you will receive an invoice from [facility]. Please contact [facility] at [phone] with questions or concerns regarding your invoice.   Our billing staff will not be able to assist you with questions regarding bills from these companies.  You will be contacted with the lab results as soon as they are available. The fastest way to get your results is to activate your My Chart account. Instructions are located on the last page of this paperwork. If you have not heard from us regarding the results in 2 weeks, please contact this office.       Health Maintenance, Female Adopting a healthy lifestyle and getting preventive care are important in promoting health and wellness. Ask your health care provider about:  The right schedule for you to have regular tests and exams.  Things you can do on your own to prevent diseases and keep yourself healthy. What should I know about diet, weight, and exercise? Eat a healthy diet   Eat a diet that includes plenty of vegetables, fruits, low-fat dairy products, and lean protein.  Do not eat a lot of foods that are high in solid fats, added sugars, or sodium. Maintain a healthy weight Body mass index (BMI) is used to identify weight problems. It estimates body fat based on height and weight. Your health care provider can help determine your BMI and help you achieve or maintain a healthy weight. Get regular exercise Get regular exercise. This is one of the most important things you can do for your health. Most  adults should:  Exercise for at least 150 minutes each week. The exercise should increase your heart rate and make you sweat (moderate-intensity exercise).  Do strengthening exercises at least twice a week. This is in addition to the moderate-intensity exercise.  Spend less time sitting. Even light physical activity can be beneficial. Watch cholesterol and blood lipids Have your blood tested for lipids and cholesterol at 51 years of age, then have this test every 5 years. Have your cholesterol levels checked more often if:  Your lipid or cholesterol levels are high.  You are older than 51 years of age.  You are at high risk for heart disease. What should I know about cancer screening? Depending on your health history and family history, you may need to have cancer screening at various ages. This may include screening for:  Breast cancer.  Cervical cancer.  Colorectal cancer.  Skin cancer.  Lung cancer. What should I know about heart disease, diabetes, and high blood pressure? Blood pressure and heart disease  High blood pressure causes heart disease and increases the risk of stroke. This is more likely to develop in people who have high blood pressure readings, are of African descent, or are overweight.  Have your blood pressure checked: ? Every 3-5 years if you are 18-39 years of age. ? Every year if you are 40 years old or older. Diabetes Have regular diabetes screenings. This checks your fasting blood sugar level. Have the screening done:  Once   every three years after age 40 if you are at a normal weight and have a low risk for diabetes.  More often and at a younger age if you are overweight or have a high risk for diabetes. What should I know about preventing infection? Hepatitis B If you have a higher risk for hepatitis B, you should be screened for this virus. Talk with your health care provider to find out if you are at risk for hepatitis B infection. Hepatitis  C Testing is recommended for:  Everyone born from 1945 through 1965.  Anyone with known risk factors for hepatitis C. Sexually transmitted infections (STIs)  Get screened for STIs, including gonorrhea and chlamydia, if: ? You are sexually active and are younger than 51 years of age. ? You are older than 51 years of age and your health care provider tells you that you are at risk for this type of infection. ? Your sexual activity has changed since you were last screened, and you are at increased risk for chlamydia or gonorrhea. Ask your health care provider if you are at risk.  Ask your health care provider about whether you are at high risk for HIV. Your health care provider may recommend a prescription medicine to help prevent HIV infection. If you choose to take medicine to prevent HIV, you should first get tested for HIV. You should then be tested every 3 months for as long as you are taking the medicine. Pregnancy  If you are about to stop having your period (premenopausal) and you may become pregnant, seek counseling before you get pregnant.  Take 400 to 800 micrograms (mcg) of folic acid every day if you become pregnant.  Ask for birth control (contraception) if you want to prevent pregnancy. Osteoporosis and menopause Osteoporosis is a disease in which the bones lose minerals and strength with aging. This can result in bone fractures. If you are 65 years old or older, or if you are at risk for osteoporosis and fractures, ask your health care provider if you should:  Be screened for bone loss.  Take a calcium or vitamin D supplement to lower your risk of fractures.  Be given hormone replacement therapy (HRT) to treat symptoms of menopause. Follow these instructions at home: Lifestyle  Do not use any products that contain nicotine or tobacco, such as cigarettes, e-cigarettes, and chewing tobacco. If you need help quitting, ask your health care provider.  Do not use street  drugs.  Do not share needles.  Ask your health care provider for help if you need support or information about quitting drugs. Alcohol use  Do not drink alcohol if: ? Your health care provider tells you not to drink. ? You are pregnant, may be pregnant, or are planning to become pregnant.  If you drink alcohol: ? Limit how much you use to 0-1 drink a day. ? Limit intake if you are breastfeeding.  Be aware of how much alcohol is in your drink. In the [place] one drink equals one 12 oz bottle of beer (355 mL), one 5 oz glass of wine (148 mL), or one 1 oz glass of hard liquor (44 mL). General instructions  Schedule regular health, dental, and eye exams.  Stay current with your vaccines.  Tell your health care provider if: ? You often feel depressed. ? You have ever been abused or do not feel safe at home. Summary  Adopting a healthy lifestyle and getting preventive care are important in promoting health and   wellness.  Follow your health care provider's instructions about healthy diet, exercising, and getting tested or screened for diseases.  Follow your health care provider's instructions on monitoring your cholesterol and blood pressure. This information is not intended to replace advice given to you by your health care provider. Make sure you discuss any questions you have with your health care provider. Document Revised: 12/23/2017 Document Reviewed: 12/23/2017 Elsevier Patient Education  2020 Elsevier Inc.  

## 2019-11-29 NOTE — Assessment & Plan Note (Signed)
Well-controlled hypertension.  Continue amlodipine 5 mg daily. Diet and nutrition discussed. Follow-up in 3 to 6 months.

## 2019-11-29 NOTE — Progress Notes (Signed)
Kristina Johnston 49 y.o.   Chief Complaint  Patient presents with   Hypertension    follow up 6 month   Medication Refill    Triamcinolone cream    HISTORY OF PRESENT ILLNESS: This is a 49 y.o. female with history of hypertension here for follow-up. Doing well.  Has no complaints or medical concerns. Fully vaccinated against Covid with booster. Recent mammogram done normal. BP Readings from Last 3 Encounters:  11/29/19 130/75  05/24/19 116/76  04/11/19 110/78    HPI   Prior to Admission medications   Medication Sig Start Date End Date Taking? Authorizing Provider  amLODipine (NORVASC) 5 MG tablet TAKE 1 TABLET BY MOUTH DAILY 04/01/19  Yes Stallings, Zoe A, MD  Ascorbic Acid (VITAMIN C PO) Take by mouth daily.   Yes [provider]  Cholecalciferol (VITAMIN D3 PO) Take by mouth daily.   Yes [provider]  ferrous sulfate 220 (44 Fe) MG/5ML solution Take 220 mg by mouth 2 (two) times daily. 03/28/19  Yes [provider]  haloperidol (HALDOL) 2 MG/ML solution Take 2 mg by mouth daily.  08/25/17  Yes [provider]  ibuprofen (ADVIL,MOTRIN) 800 MG tablet Take 1 tablet (800 mg total) by mouth every 6 (six) hours as needed. 09/12/17  Yes Long, Wonda Olds, MD  loratadine (CLARITIN) 5 MG/5ML syrup Take by mouth daily.   Yes [provider]  Multiple Vitamins-Minerals (ADULT ONE DAILY GUMMIES PO) Take 2 tablets by mouth daily.   Yes [provider]  Multiple Vitamins-Minerals (ZINC PO) Take by mouth daily.   Yes [provider]  norelgestromin-ethinyl estradiol Marilu Favre) 150-35 MCG/24HR transdermal patch Place 1 patch onto the skin once a week. 04/04/19  Yes Forrest Moron, MD  Omega-3 Fatty Acids (FISH OIL PO) Take by mouth daily.   Yes [provider]  triamcinolone cream (KENALOG) 0.1 % APPLY TO SKIN TWICE A DAY FOR NO MORE THAN 2 WEEKS 04/11/19  Yes Delia Chimes A, MD  Valproate Sodium (DEPAKENE) 250 MG/5ML SOLN  solution Take 2 mLs by mouth 2 (two) times daily.    Yes [provider]  vitamin B-12 (CYANOCOBALAMIN) 250 MCG tablet Take 250 mcg by mouth daily.   Yes [provider]  aspirin 325 MG tablet Take 650 mg by mouth every 4 (four) hours as needed for moderate pain. Patient not taking: Reported on 11/29/2019    [provider]  calcium-vitamin D (OSCAL WITH D) 250-125 MG-UNIT tablet Take 1 tablet by mouth daily. Patient not taking: Reported on 11/29/2019    [provider]  Potassium (POTASSIMIN PO) Take by mouth daily. Patient not taking: Reported on 11/29/2019    [provider]  traMADol (ULTRAM) 50 MG tablet Take 1 tablet (50 mg total) by mouth every 6 (six) hours as needed for severe pain. Patient not taking: Reported on 11/29/2019 09/12/17   Long, Wonda Olds, MD    No Known Allergies  Patient Active Problem List   Diagnosis Date Noted   Essential hypertension 06/06/2017   Bipolar disorder ([place]) 04/08/2017   Depression 03/17/2011    Past Medical History:  Diagnosis Date   Anxiety    Depression    Paranoid schizophrenia ([place])     History reviewed. No pertinent surgical history.  Social History   Socioeconomic History   Marital status: Divorced    Spouse name: Not on file   Number of children: Not on file   Years of education: Not on file  Highest education level: Not on file  Occupational History   Not on file  Tobacco Use   Smoking status: Never Smoker   Smokeless tobacco: Never Used  Substance and Sexual Activity   Alcohol use: No   Drug use: No   Sexual activity: Yes  Other Topics Concern   Not on file  Social History Narrative   Not on file   Social Determinants of Health   Financial Resource Strain:    Difficulty of Paying Living Expenses: Not on file  Food Insecurity:    Worried About [place] in the Last Year: Not on file   Ran Out of Food in the Last Year: Not on file    Transportation Needs:    Lack of Transportation (Medical): Not on file   Lack of Transportation (Non-Medical): Not on file  Physical Activity:    Days of Exercise per Week: Not on file   Minutes of Exercise per Session: Not on file  Stress:    Feeling of Stress : Not on file  Social Connections:    Frequency of Communication with Friends and Family: Not on file   Frequency of Social Gatherings with Friends and Family: Not on file   Attends Religious Services: Not on file   Active Member of Clubs or Organizations: Not on file   Attends Archivist Meetings: Not on file   Marital Status: Not on file  Intimate Partner Violence:    Fear of Current or Ex-Partner: Not on file   Emotionally Abused: Not on file   Physically Abused: Not on file   Sexually Abused: Not on file    Family History  Problem Relation Age of Onset   Heart disease Mother    Hyperlipidemia Mother    Mental illness Mother    Diabetes Father    Hypertension Sister    Hyperlipidemia Brother    Hypertension Brother    Cancer Maternal Grandmother    Cancer Maternal Grandfather    Hyperlipidemia Maternal Grandfather    Hypertension Maternal Grandfather    Breast cancer Maternal Grandfather    Cancer Paternal Grandmother 61       breast cancer   Hyperlipidemia Paternal Grandfather    Hypertension Paternal Grandfather    Cancer Maternal Aunt 40       breast cancer   Breast cancer Maternal Aunt    Breast cancer Cousin      Review of Systems  Constitutional: Negative.  Negative for chills and fever.  HENT: Negative.  Negative for congestion and sore throat.   Respiratory: Negative.  Negative for cough and shortness of breath.   Cardiovascular: Negative.  Negative for chest pain and palpitations.  Gastrointestinal: Negative for abdominal pain, diarrhea, nausea and vomiting.  Genitourinary: Negative.  Negative for dysuria and hematuria.  Musculoskeletal: Negative.   Negative for back pain, myalgias and neck pain.  Skin: Negative.  Negative for rash.  Neurological: Negative.  Negative for dizziness and headaches.  All other systems reviewed and are negative.   Today's Vitals   11/29/19 0954  BP: 130/75  Pulse: 93  Resp: 16  Temp: 98.3 F (36.8 C)  TempSrc: Temporal  SpO2: 97%  Weight: 175 lb (79.4 kg)  Height: 5\' 5"  (1.651 m)   Body mass index is 29.12 kg/m. Wt Readings from Last 3 Encounters:  11/29/19 175 lb (79.4 kg)  05/24/19 170 lb (77.1 kg)  04/11/19 168 lb (76.2 kg)    Physical Exam Vitals reviewed.  Constitutional:      Appearance: Normal appearance.  HENT:     Head: Normocephalic.  Eyes:     Extraocular Movements: Extraocular movements intact.     Conjunctiva/sclera: Conjunctivae normal.     Pupils: Pupils are equal, round, and reactive to light.  Cardiovascular:     Rate and Rhythm: Normal rate and regular rhythm.     Pulses: Normal pulses.     Heart sounds: Normal heart sounds.  Pulmonary:     Effort: Pulmonary effort is normal.     Breath sounds: Normal breath sounds.  Musculoskeletal:        General: Normal range of motion.     Cervical back: Normal range of motion and neck supple.  Skin:    General: Skin is warm and dry.     Capillary Refill: Capillary refill takes less than 2 seconds.  Neurological:     General: No focal deficit present.     Mental Status: She is alert and oriented to person, place, and time.  Psychiatric:        Mood and Affect: Mood normal.        Behavior: Behavior normal.     A total of 30 minutes was spent with the patient, greater than 50% of which was in counseling/coordination of care regarding hypertension and cardiovascular risks associated with this condition, review of all medications, review of most recent office visit notes, review of most recent blood work results, health maintenance items including need for colonoscopy, education on nutrition, documentation, prognosis and  need for follow-up.   ASSESSMENT & PLAN: Essential hypertension Well-controlled hypertension.  Continue amlodipine 5 mg daily. Diet and nutrition discussed. Follow-up in 3 to 6 months.  Myrah was seen today for hypertension and medication refill.  Diagnoses and all orders for this visit:  Essential hypertension -     amLODipine (NORVASC) 5 MG tablet; Take 1 tablet (5 mg total) by mouth daily.  Bipolar disorder, in full remission, most recent episode manic ([place])  Other orders -     triamcinolone (KENALOG) 0.1 %; APPLY TO SKIN TWICE A DAY FOR NO MORE THAN 2 WEEKS    Patient Instructions       If you have lab work done today you will be contacted with your lab results within the next 2 weeks.  If you have not heard from [place] then please contact us. The fastest way to get your results is to register for My Chart.   IF you received an x-ray today, you will receive an invoice from [facility]. Please contact [facility] at [phone] with questions or concerns regarding your invoice.   IF you received labwork today, you will receive an invoice from [facility]. Please contact [facility] at [phone] with questions or concerns regarding your invoice.   Our billing staff will not be able to assist you with questions regarding bills from these companies.  You will be contacted with the lab results as soon as they are available. The fastest way to get your results is to activate your My Chart account. Instructions are located on the last page of this paperwork. If you have not heard from [place] regarding the results in 2 weeks, please contact this office.     Health Maintenance, Female Adopting a healthy lifestyle and getting preventive care are important in promoting health and wellness. Ask your health care provider about:  The right schedule for you to have regular tests and exams.  Things you can do on  your own to prevent diseases and keep yourself  healthy. What should I know about diet, weight, and exercise? Eat a healthy diet   Eat a diet that includes plenty of vegetables, fruits, low-fat dairy products, and lean protein.  Do not eat a lot of foods that are high in solid fats, added sugars, or sodium. Maintain a healthy weight Body mass index (BMI) is used to identify weight problems. It estimates body fat based on height and weight. Your health care provider can help determine your BMI and help you achieve or maintain a healthy weight. Get regular exercise Get regular exercise. This is one of the most important things you can do for your health. Most adults should:  Exercise for at least 150 minutes each week. The exercise should increase your heart rate and make you sweat (moderate-intensity exercise).  Do strengthening exercises at least twice a week. This is in addition to the moderate-intensity exercise.  Spend less time sitting. Even light physical activity can be beneficial. Watch cholesterol and blood lipids Have your blood tested for lipids and cholesterol at 49 years of age, then have this test every 5 years. Have your cholesterol levels checked more often if:  Your lipid or cholesterol levels are high.  You are older than 51 years of age.  You are at high risk for heart disease. What should I know about cancer screening? Depending on your health history and family history, you may need to have cancer screening at various ages. This may include screening for:  Breast cancer.  Cervical cancer.  Colorectal cancer.  Skin cancer.  Lung cancer. What should I know about heart disease, diabetes, and high blood pressure? Blood pressure and heart disease  High blood pressure causes heart disease and increases the risk of stroke. This is more likely to develop in people who have high blood pressure readings, are of African descent, or are overweight.  Have your blood pressure checked: ? Every 3-5 years if you are  89-45 years of age. ? Every year if you are 83 years old or older. Diabetes Have regular diabetes screenings. This checks your fasting blood sugar level. Have the screening done:  Once every three years after age 35 if you are at a normal weight and have a low risk for diabetes.  More often and at a younger age if you are overweight or have a high risk for diabetes. What should I know about preventing infection? Hepatitis B If you have a higher risk for hepatitis B, you should be screened for this virus. Talk with your health care provider to find out if you are at risk for hepatitis B infection. Hepatitis C Testing is recommended for:  Everyone born from [birth year] through 1965.  Anyone with known risk factors for hepatitis C. Sexually transmitted infections (STIs)  Get screened for STIs, including gonorrhea and chlamydia, if: ? You are sexually active and are younger than 51 years of age. ? You are older than 51 years of age and your health care provider tells you that you are at risk for this type of infection. ? Your sexual activity has changed since you were last screened, and you are at increased risk for chlamydia or gonorrhea. Ask your health care provider if you are at risk.  Ask your health care provider about whether you are at high risk for HIV. Your health care provider may recommend a prescription medicine to help prevent HIV infection. If you choose to take medicine to prevent  HIV, you should first get tested for HIV. You should then be tested every 3 months for as long as you are taking the medicine. Pregnancy  If you are about to stop having your period (premenopausal) and you may become pregnant, seek counseling before you get pregnant.  Take 400 to 800 micrograms (mcg) of folic acid every day if you become pregnant.  Ask for birth control (contraception) if you want to prevent pregnancy. Osteoporosis and menopause Osteoporosis is a disease in which the bones lose  minerals and strength with aging. This can result in bone fractures. If you are 44 years old or older, or if you are at risk for osteoporosis and fractures, ask your health care provider if you should:  Be screened for bone loss.  Take a calcium or vitamin D supplement to lower your risk of fractures.  Be given hormone replacement therapy (HRT) to treat symptoms of menopause. Follow these instructions at home: Lifestyle  Do not use any products that contain nicotine or tobacco, such as cigarettes, e-cigarettes, and chewing tobacco. If you need help quitting, ask your health care provider.  Do not use street drugs.  Do not share needles.  Ask your health care provider for help if you need support or information about quitting drugs. Alcohol use  Do not drink alcohol if: ? Your health care provider tells you not to drink. ? You are pregnant, may be pregnant, or are planning to become pregnant.  If you drink alcohol: ? Limit how much you use to 0-1 drink a day. ? Limit intake if you are breastfeeding.  Be aware of how much alcohol is in your drink. In the [place] one drink equals one 12 oz bottle of beer (355 mL), one 5 oz glass of wine (148 mL), or one 1 oz glass of hard liquor (44 mL). General instructions  Schedule regular health, dental, and eye exams.  Stay current with your vaccines.  Tell your health care provider if: ? You often feel depressed. ? You have ever been abused or do not feel safe at home. Summary  Adopting a healthy lifestyle and getting preventive care are important in promoting health and wellness.  Follow your health care provider's instructions about healthy diet, exercising, and getting tested or screened for diseases.  Follow your health care provider's instructions on monitoring your cholesterol and blood pressure. This information is not intended to replace advice given to you by your health care provider. Make sure you discuss any questions you  have with your health care provider. Document Revised: 12/23/2017 Document Reviewed: 12/23/2017 Elsevier Patient Education  2020 Elsevier Inc.      Agustina Caroli, MD [facility]

## 2019-12-19 ENCOUNTER — Other Ambulatory Visit: Payer: Self-pay | Admitting: Emergency Medicine

## 2019-12-19 NOTE — Telephone Encounter (Signed)
Requested Prescriptions  Pending Prescriptions Disp Refills  . Kristina Johnston 150-35 MCG/24HR transdermal patch [Pharmacy Med Name: Kristina Johnston PATCHES] 3 patch 6    Sig: APPLY 1 PATCH TOPICALLY TO THE SKIN 1 TIME A WEEK     OB/GYN:  Contraceptives Passed - 12/19/2019  3:54 PM      Passed - Last BP in normal range    BP Readings from Last 1 Encounters:  11/29/19 130/75         Passed - Valid encounter within last 12 months    Recent Outpatient Visits          2 weeks ago Essential hypertension   Primary Care at [facility], Ines Bloomer, MD   6 months ago Essential hypertension   Primary Care at [facility], Ines Bloomer, MD   8 months ago Encounter for health maintenance examination in adult   Primary Care at [facility], Arlie Solomons, MD   10 months ago Medicare annual wellness visit, subsequent   Primary Care at [facility], Arlie Solomons, MD   1 year ago Need for prophylactic vaccination and inoculation against influenza   Primary Care at [facility], Arlie Solomons, MD      Future Appointments            In 3 months Sagard[place], Ines Bloomer, MD Primary Care at [place], [facility]

## 2020-03-08 ENCOUNTER — Ambulatory Visit: Payer: Medicare Other | Admitting: Obstetrics & Gynecology

## 2020-03-08 ENCOUNTER — Encounter: Payer: Self-pay | Admitting: Obstetrics & Gynecology

## 2020-03-08 ENCOUNTER — Telehealth: Payer: Self-pay | Admitting: Emergency Medicine

## 2020-03-08 ENCOUNTER — Other Ambulatory Visit: Payer: Self-pay

## 2020-03-08 VITALS — BP 116/70 | Ht 64.5 in | Wt 169.0 lb

## 2020-03-08 DIAGNOSIS — D219 Benign neoplasm of connective and other soft tissue, unspecified: Secondary | ICD-10-CM

## 2020-03-08 DIAGNOSIS — Z3045 Encounter for surveillance of transdermal patch hormonal contraceptive device: Secondary | ICD-10-CM | POA: Diagnosis not present

## 2020-03-08 DIAGNOSIS — R42 Dizziness and giddiness: Secondary | ICD-10-CM | POA: Diagnosis not present

## 2020-03-08 DIAGNOSIS — Z1329 Encounter for screening for other suspected endocrine disorder: Secondary | ICD-10-CM | POA: Diagnosis not present

## 2020-03-08 LAB — CBC
HCT: 34 % — ABNORMAL LOW (ref 35.0–45.0)
Hemoglobin: 11.5 g/dL — ABNORMAL LOW (ref 11.7–15.5)
MCH: 31 pg (ref 27.0–33.0)
MCHC: 33.8 g/dL (ref 32.0–36.0)
MCV: 91.6 fL (ref 80.0–100.0)
MPV: 10.6 fL (ref 7.5–12.5)
Platelets: 252 10*3/uL (ref 140–400)
RBC: 3.71 10*6/uL — ABNORMAL LOW (ref 3.80–5.10)
RDW: 12.9 % (ref 11.0–15.0)
WBC: 4.1 10*3/uL (ref 3.8–10.8)

## 2020-03-08 NOTE — Progress Notes (Signed)
° ° °  Gelila Well [birth year] [id number]        50 y.o.  G1P1L1 Divorced.  Son is 41 yo.  RP: Large Fibroids  HPI: Well on Xulane with normal light menses monthly.  No severe abdomino-pelvic pain, but feels full and has seen her "big hard belly" for many years. Currently abstinent.  Occasional dizzy spells.  Urine/BMs normal.   OB History  Gravida Para Term Preterm AB Living  1 1       1   SAB IAB Ectopic Multiple Live Births               # Outcome Date GA Lbr Len/2nd Weight Sex Delivery Anes PTL Lv  1 Para             Past medical history,surgical history, problem list, medications, allergies, family history and social history were all reviewed and documented in the EPIC chart.   Directed ROS with pertinent positives and negatives documented in the history of present illness/assessment and plan.  Exam:  Vitals:   03/08/20 0936  BP: 116/70  Weight: 169 lb (76.7 kg)  Height: 5' 4.5" (1.638 m)   General appearance:  Normal  Abdomen: Very large hard nodular mass reaching the lower rib cage L>R.  Mobile and non-tender.  Gynecologic exam: Vulva normal.  Speculum:  Cervix/Vagina normal.  Bimanual exam:  Very large myomatous uterus, but not bulky at the cervix.  No fibroid pressing at the vagina.  No adnexal mass felt at the vagina, but large abdominal mass could be originating from an ovary, NT bilaterally.   Assessment/Plan:  50 y.o. G1P1   1. Fibroids Probable very large Fibroids/Ovarian mass not ruled out.  Abdominal/pelvic fullness, but no severe pain and no menorrhagia on Xulane patch for many years.  Will r/o anemia with a CBC today.  Continue on Xulane for now.  F/U Abdomino-Pelvic US to assess the large mass/Uterus and Ovaries.  Patient informed of the findings on exam.  Counseling done on management of large Fibroids/Ovarian mass.  Management plan per CBC and Abdomino-Pelvic US findings. - CBC - US Transvaginal Non-OB; Future  2. Encounter for surveillance of  transdermal patch hormonal contraceptive device Continue on Xulane until [place].  Will probably change to a Progestin-only contraceptive per US findings.  3. Dizzy spells R/O anemia and Thyroid dysfunction. - CBC - TSH  Other orders - magnesium 30 MG tablet; Take 30 mg by mouth 2 (two) times daily. - Collagen-Vitamin C-Biotin (COLLAGEN 1500/C PO); Take by mouth. - vitamin k 100 MCG tablet; Take 100 mcg by mouth daily. - Ginger, Zingiber officinalis, (GINGER ROOT) 250 MG CAPS; Take by mouth.  [name] MD, 10:06 AM 03/08/2020

## 2020-03-08 NOTE — Telephone Encounter (Signed)
03/08/2020 - PATIENT SAW DR. MARIE LAVOIE (OBGYN) AS A NEW PATIENT TODAY FOR FIBROIDS. SHE IS TRYING TO DO RESEARCH AND PEOPLE ARE TELLING HER ALL DIFFERENT KIND OF THINGS ABOUT IT. SHE WOULD LIKE TO GET SOME INFORMATION FROM DR. MIGUEL BECAUSE SHE DOES NOT WANT TO HAVE SURGERY TO REMOVE THEM BUT SHE WAS TOLD IF SHE DOESN'T THEY CAN KILL HER? SHE WILL BE SCHEDULED FOR AN ULTRASOUND SOON. BEST PHONE [phone] (CELL) [place]

## 2020-03-08 NOTE — Telephone Encounter (Signed)
Pt needs virtual to discuss

## 2020-03-09 ENCOUNTER — Encounter: Payer: Self-pay | Admitting: Obstetrics & Gynecology

## 2020-03-09 LAB — TSH: TSH: 3.85 mIU/L

## 2020-03-09 NOTE — Telephone Encounter (Signed)
Patient already scheduled for an appointment with the provider on 3/31; she stated she'll wait until then to ask her questions.

## 2020-04-05 ENCOUNTER — Other Ambulatory Visit: Payer: Medicare Other | Admitting: Obstetrics & Gynecology

## 2020-04-05 ENCOUNTER — Other Ambulatory Visit: Payer: Medicare Other

## 2020-04-10 ENCOUNTER — Telehealth: Payer: Self-pay

## 2020-04-10 NOTE — Telephone Encounter (Signed)
Patient called in voice mail stating that her psychiatrist would like her most recent lab results.  I called her back and per DPR access note on file I left detailed message that she will need to complete a medical records release form giving [place] written permission to release the information.  I offered to fax or email it to her or she can stop by the office to sign it.  I asked her to call me back and let me know.

## 2020-04-12 ENCOUNTER — Encounter: Payer: Medicare Other | Admitting: Emergency Medicine

## 2020-04-23 ENCOUNTER — Other Ambulatory Visit: Payer: Self-pay

## 2020-04-23 ENCOUNTER — Ambulatory Visit (INDEPENDENT_AMBULATORY_CARE_PROVIDER_SITE_OTHER): Payer: Medicare Other | Admitting: Emergency Medicine

## 2020-04-23 ENCOUNTER — Encounter: Payer: Self-pay | Admitting: Emergency Medicine

## 2020-04-23 VITALS — BP 138/84 | HR 91 | Temp 98.8°F | Ht 65.0 in | Wt 175.6 lb

## 2020-04-23 DIAGNOSIS — D219 Benign neoplasm of connective and other soft tissue, unspecified: Secondary | ICD-10-CM | POA: Diagnosis not present

## 2020-04-23 DIAGNOSIS — I10 Essential (primary) hypertension: Secondary | ICD-10-CM | POA: Diagnosis not present

## 2020-04-23 DIAGNOSIS — F3176 Bipolar disorder, in full remission, most recent episode depressed: Secondary | ICD-10-CM | POA: Diagnosis not present

## 2020-04-23 LAB — COMPREHENSIVE METABOLIC PANEL
ALT: 15 U/L (ref 0–35)
AST: 18 U/L (ref 0–37)
Albumin: 3.7 g/dL (ref 3.5–5.2)
Alkaline Phosphatase: 40 U/L (ref 39–117)
BUN: 18 mg/dL (ref 6–23)
CO2: 29 mEq/L (ref 19–32)
Calcium: 8.9 mg/dL (ref 8.4–10.5)
Chloride: 102 mEq/L (ref 96–112)
Creatinine, Ser: 0.71 mg/dL (ref 0.40–1.20)
GFR: 98.46 mL/min (ref >60.00)
Glucose, Bld: 73 mg/dL (ref 70–99)
Potassium: 3.9 mEq/L (ref 3.5–5.1)
Sodium: 138 mEq/L (ref 135–145)
Total Bilirubin: 0.4 mg/dL (ref 0.2–1.2)
Total Protein: 6.9 g/dL (ref 6.0–8.3)

## 2020-04-23 LAB — LIPID PANEL
Cholesterol: 170 mg/dL (ref 0–200)
HDL: 73.1 mg/dL (ref >39.00)
LDL Cholesterol: 61 mg/dL (ref 0–99)
NonHDL: 97.32
Total CHOL/HDL Ratio: 2
Triglycerides: 182 mg/dL — ABNORMAL HIGH (ref 0.0–149.0)
VLDL: 36.4 mg/dL (ref 0.0–40.0)

## 2020-04-23 NOTE — Patient Instructions (Signed)
Increase amlodipine to 10 mg daily. Monitor blood pressure readings at home once a day for the next several weeks. Follow-up in 6 months. [email or web address]">  DASH Eating Plan DASH stands for Dietary Approaches to Stop Hypertension. The DASH eating plan is a healthy eating plan that has been shown to:  Reduce high blood pressure (hypertension).  Reduce your risk for type 2 diabetes, heart disease, and stroke.  Help with weight loss. What are tips for following this plan? Reading food labels  Check food labels for the amount of salt (sodium) per serving. Choose foods with less than 5 percent of the Daily Value of sodium. Generally, foods with less than 300 milligrams (mg) of sodium per serving fit into this eating plan.  To find whole grains, look for the word "whole" as the first word in the ingredient list. Shopping  Buy products labeled as "low-sodium" or "no salt added."  Buy fresh foods. Avoid canned foods and pre-made or frozen meals. Cooking  Avoid adding salt when cooking. Use salt-free seasonings or herbs instead of table salt or sea salt. Check with your health care provider or pharmacist before using salt substitutes.  Do not fry foods. Cook foods using healthy methods such as baking, boiling, grilling, roasting, and broiling instead.  Cook with heart-healthy oils, such as olive, canola, avocado, soybean, or sunflower oil. Meal planning  Eat a balanced diet that includes: ? 4 or more servings of fruits and 4 or more servings of vegetables each day. Try to fill one-half of your plate with fruits and vegetables. ? 6-8 servings of whole grains each day. ? Less than 6 oz (170 g) of lean meat, poultry, or fish each day. A 3-oz (85-g) serving of meat is about the same size as a deck of cards. One egg equals 1 oz (28 g). ? 2-3 servings of low-fat dairy each day. One serving is 1 cup (237 mL). ? 1 serving of nuts, seeds, or beans 5  times each week. ? 2-3 servings of heart-healthy fats. Healthy fats called omega-3 fatty acids are found in foods such as walnuts, flaxseeds, fortified milks, and eggs. These fats are also found in cold-water fish, such as sardines, salmon, and mackerel.  Limit how much you eat of: ? Canned or prepackaged foods. ? Food that is high in trans fat, such as some fried foods. ? Food that is high in saturated fat, such as fatty meat. ? Desserts and other sweets, sugary drinks, and other foods with added sugar. ? Full-fat dairy products.  Do not salt foods before eating.  Do not eat more than 4 egg yolks a week.  Try to eat at least 2 vegetarian meals a week.  Eat more home-cooked food and less restaurant, buffet, and fast food.   Lifestyle  When eating at a restaurant, ask that your food be prepared with less salt or no salt, if possible.  If you drink alcohol: ? Limit how much you use to:  0-1 drink a day for women who are not pregnant.  0-2 drinks a day for men. ? Be aware of how much alcohol is in your drink. In the [place] one drink equals one 12 oz bottle of beer (355 mL), one 5 oz glass of wine (148 mL), or one 1 oz glass of hard liquor (44 mL). General information  Avoid eating more than 2,300 mg of salt a day. If you have hypertension, you may need to reduce your sodium intake to 1,500 mg a day.  Work with your health care provider to maintain a healthy body weight or to lose weight. Ask what an ideal weight is for you.  Get at least 30 minutes of exercise that causes your heart to beat faster (aerobic exercise) most days of the week. Activities may include walking, swimming, or biking.  Work with your health care provider or dietitian to adjust your eating plan to your individual calorie needs. What foods should I eat? Fruits All fresh, dried, or frozen fruit. Canned fruit in natural juice (without added sugar). Vegetables Fresh or frozen vegetables (raw, steamed, roasted,  or grilled). Low-sodium or reduced-sodium tomato and vegetable juice. Low-sodium or reduced-sodium tomato sauce and tomato paste. Low-sodium or reduced-sodium canned vegetables. Grains Whole-grain or whole-wheat bread. Whole-grain or whole-wheat pasta. Brown rice. [place]. Bulgur. Whole-grain and low-sodium cereals. Pita bread. Low-fat, low-sodium crackers. Whole-wheat flour tortillas. Meats and other proteins Skinless chicken or [place]. Ground chicken or [place]. Pork with fat trimmed off. Fish and seafood. Egg whites. Dried beans, peas, or lentils. Unsalted nuts, nut butters, and seeds. Unsalted canned beans. Lean cuts of beef with fat trimmed off. Low-sodium, lean precooked or cured meat, such as sausages or meat loaves. Dairy Low-fat (1%) or fat-free (skim) milk. Reduced-fat, low-fat, or fat-free cheeses. Nonfat, low-sodium ricotta or cottage cheese. Low-fat or nonfat yogurt. Low-fat, low-sodium cheese. Fats and oils Soft margarine without trans fats. Vegetable oil. Reduced-fat, low-fat, or light mayonnaise and salad dressings (reduced-sodium). Canola, safflower, olive, avocado, soybean, and sunflower oils. Avocado. Seasonings and condiments Herbs. Spices. Seasoning mixes without salt. Other foods Unsalted popcorn and pretzels. Fat-free sweets. The items listed above may not be a complete list of foods and beverages you can eat. Contact a dietitian for more information. What foods should I avoid? Fruits Canned fruit in a light or heavy syrup. Fried fruit. Fruit in cream or butter sauce. Vegetables Creamed or fried vegetables. Vegetables in a cheese sauce. Regular canned vegetables (not low-sodium or reduced-sodium). Regular canned tomato sauce and paste (not low-sodium or reduced-sodium). Regular tomato and vegetable juice (not low-sodium or reduced-sodium). Angie Fava. Olives. Grains Baked goods made with fat, such as croissants, muffins, or some breads. Dry pasta or rice meal  packs. Meats and other proteins Fatty cuts of meat. Ribs. Fried meat. Berniece Salines. Bologna, salami, and other precooked or cured meats, such as sausages or meat loaves. Fat from the back of a pig (fatback). Bratwurst. Salted nuts and seeds. Canned beans with added salt. Canned or smoked fish. Whole eggs or egg yolks. Chicken or [place] with skin. Dairy Whole or 2% milk, cream, and half-and-half. Whole or full-fat cream cheese. Whole-fat or sweetened yogurt. Full-fat cheese. Nondairy creamers. Whipped toppings. Processed cheese and cheese spreads. Fats and oils Butter. Stick margarine. Lard. Shortening. Ghee. Bacon fat. Tropical oils, such as coconut, palm kernel, or palm oil. Seasonings and condiments Onion salt, garlic salt, seasoned salt, table salt, and sea salt. Worcestershire sauce. Tartar sauce. Barbecue sauce. Teriyaki sauce. Soy sauce, including reduced-sodium. Steak sauce. Canned and packaged gravies. Fish sauce. Oyster sauce. Cocktail sauce. Store-bought horseradish. Ketchup. Mustard. Meat flavorings and tenderizers. Bouillon cubes. Hot sauces. Pre-made or packaged marinades. Pre-made or packaged taco seasonings. Relishes. Regular salad dressings. Other foods Salted popcorn and pretzels. The items listed above may not be a complete list of foods and beverages you should avoid. Contact a dietitian for more information. Where to find more information  National Heart, Lung, and Blood Institute: https://wilson-eaton.com/  American Heart Association: www.heart.org  Academy of Nutrition and  Dietetics: www.eatright.[place]: www.kidney.org Summary  The DASH eating plan is a healthy eating plan that has been shown to reduce high blood pressure (hypertension). It may also reduce your risk for type 2 diabetes, heart disease, and stroke.  When on the DASH eating plan, aim to eat more fresh fruits and vegetables, whole grains, lean proteins, low-fat dairy, and heart-healthy  fats.  With the DASH eating plan, you should limit salt (sodium) intake to 2,300 mg a day. If you have hypertension, you may need to reduce your sodium intake to 1,500 mg a day.  Work with your health care provider or dietitian to adjust your eating plan to your individual calorie needs. This information is not intended to replace advice given to you by your health care provider. Make sure you discuss any questions you have with your health care provider. Document Revised: 12/03/2018 Document Reviewed: 12/03/2018 Elsevier Patient Education  2021 [place].   Hypertension, Adult High blood pressure (hypertension) is when the force of blood pumping through the arteries is too strong. The arteries are the blood vessels that carry blood from the heart throughout the body. Hypertension forces the heart to work harder to pump blood and may cause arteries to become narrow or stiff. Untreated or uncontrolled hypertension can cause a heart attack, heart failure, a stroke, kidney disease, and other problems. A blood pressure reading consists of a higher number over a lower number. Ideally, your blood pressure should be below 120/80. The first ("top") number is called the systolic pressure. It is a measure of the pressure in your arteries as your heart beats. The second ("bottom") number is called the diastolic pressure. It is a measure of the pressure in your arteries as the heart relaxes. What are the causes? The exact cause of this condition is not known. There are some conditions that result in or are related to high blood pressure. What increases the risk? Some risk factors for high blood pressure are under your control. The following factors may make you more likely to develop this condition:  Smoking.  Having type 2 diabetes mellitus, high cholesterol, or both.  Not getting enough exercise or physical activity.  Being overweight.  Having too much fat, sugar, calories, or salt (sodium) in  your diet.  Drinking too much alcohol. Some risk factors for high blood pressure may be difficult or impossible to change. Some of these factors include:  Having chronic kidney disease.  Having a family history of high blood pressure.  Age. Risk increases with age.  Race. You may be at higher risk if you are African American.  Gender. Men are at higher risk than women before age 49. After age 33, women are at higher risk than men.  Having obstructive sleep apnea.  Stress. What are the signs or symptoms? High blood pressure may not cause symptoms. Very high blood pressure (hypertensive crisis) may cause:  Headache.  Anxiety.  Shortness of breath.  Nosebleed.  Nausea and vomiting.  Vision changes.  Severe chest pain.  Seizures. How is this diagnosed? This condition is diagnosed by measuring your blood pressure while you are seated, with your arm resting on a flat surface, your legs uncrossed, and your feet flat on the floor. The cuff of the blood pressure monitor will be placed directly against the skin of your upper arm at the level of your heart. It should be measured at least twice using the same arm. Certain conditions can cause a difference in  blood pressure between your right and left arms. Certain factors can cause blood pressure readings to be lower or higher than normal for a short period of time:  When your blood pressure is higher when you are in a health care provider's office than when you are at home, this is called white coat hypertension. Most people with this condition do not need medicines.  When your blood pressure is higher at home than when you are in a health care provider's office, this is called masked hypertension. Most people with this condition may need medicines to control blood pressure. If you have a high blood pressure reading during one visit or you have normal blood pressure with other risk factors, you may be asked to:  Return on a  different day to have your blood pressure checked again.  Monitor your blood pressure at home for 1 week or longer. If you are diagnosed with hypertension, you may have other blood or imaging tests to help your health care provider understand your overall risk for other conditions. How is this treated? This condition is treated by making healthy lifestyle changes, such as eating healthy foods, exercising more, and reducing your alcohol intake. Your health care provider may prescribe medicine if lifestyle changes are not enough to get your blood pressure under control, and if:  Your systolic blood pressure is above 130.  Your diastolic blood pressure is above 80. Your personal target blood pressure may vary depending on your medical conditions, your age, and other factors. Follow these instructions at home: Eating and drinking  Eat a diet that is high in fiber and potassium, and low in sodium, added sugar, and fat. An example eating plan is called the DASH (Dietary Approaches to Stop Hypertension) diet. To eat this way: ? Eat plenty of fresh fruits and vegetables. Try to fill one half of your plate at each meal with fruits and vegetables. ? Eat whole grains, such as whole-wheat pasta, brown rice, or whole-grain bread. Fill about one fourth of your plate with whole grains. ? Eat or drink low-fat dairy products, such as skim milk or low-fat yogurt. ? Avoid fatty cuts of meat, processed or cured meats, and poultry with skin. Fill about one fourth of your plate with lean proteins, such as fish, chicken without skin, beans, eggs, or tofu. ? Avoid pre-made and processed foods. These tend to be higher in sodium, added sugar, and fat.  Reduce your daily sodium intake. Most people with hypertension should eat less than 1,500 mg of sodium a day.  Do not drink alcohol if: ? Your health care provider tells you not to drink. ? You are pregnant, may be pregnant, or are planning to become pregnant.  If  you drink alcohol: ? Limit how much you use to:  0-1 drink a day for women.  0-2 drinks a day for men. ? Be aware of how much alcohol is in your drink. In the [place] one drink equals one 12 oz bottle of beer (355 mL), one 5 oz glass of wine (148 mL), or one 1 oz glass of hard liquor (44 mL).   Lifestyle  Work with your health care provider to maintain a healthy body weight or to lose weight. Ask what an ideal weight is for you.  Get at least 30 minutes of exercise most days of the week. Activities may include walking, swimming, or biking.  Include exercise to strengthen your muscles (resistance exercise), such as Pilates or lifting weights, as part  of your weekly exercise routine. Try to do these types of exercises for 30 minutes at least 3 days a week.  Do not use any products that contain nicotine or tobacco, such as cigarettes, e-cigarettes, and chewing tobacco. If you need help quitting, ask your health care provider.  Monitor your blood pressure at home as told by your health care provider.  Keep all follow-up visits as told by your health care provider. This is important.   Medicines  Take over-the-counter and prescription medicines only as told by your health care provider. Follow directions carefully. Blood pressure medicines must be taken as prescribed.  Do not skip doses of blood pressure medicine. Doing this puts you at risk for problems and can make the medicine less effective.  Ask your health care provider about side effects or reactions to medicines that you should watch for. Contact a health care provider if you:  Think you are having a reaction to a medicine you are taking.  Have headaches that keep coming back (recurring).  Feel dizzy.  Have swelling in your ankles.  Have trouble with your vision. Get help right away if you:  Develop a severe headache or confusion.  Have unusual weakness or numbness.  Feel faint.  Have severe pain in your chest or  abdomen.  Vomit repeatedly.  Have trouble breathing. Summary  Hypertension is when the force of blood pumping through your arteries is too strong. If this condition is not controlled, it may put you at risk for serious complications.  Your personal target blood pressure may vary depending on your medical conditions, your age, and other factors. For most people, a normal blood pressure is less than 120/80.  Hypertension is treated with lifestyle changes, medicines, or a combination of both. Lifestyle changes include losing weight, eating a healthy, low-sodium diet, exercising more, and limiting alcohol. This information is not intended to replace advice given to you by your health care provider. Make sure you discuss any questions you have with your health care provider. Document Revised: 09/09/2017 Document Reviewed: 09/09/2017 Elsevier Patient Education  2021 [employer].

## 2020-04-23 NOTE — Assessment & Plan Note (Signed)
Elevated blood pressure at home and in the office. Will increase amlodipine to 10 mg daily. Diet and nutrition discussed. Advised to monitor blood pressure readings at home daily for the next several weeks. Contact the office if numbers persistently elevated. Follow-up in 6 months.  Earlier as needed.

## 2020-04-23 NOTE — Progress Notes (Signed)
Assessment/Plan:  49 y.o. G1P1   1. Fibroids Probable very large Fibroids/Ovarian mass not ruled out.  Abdominal/pelvic fullness, but no severe pain and no menorrhagia on Xulane patch for many years.  Will r/o anemia with a CBC today.  Continue on Xulane for now.  F/U Abdomino-Pelvic US to assess the large mass/Uterus and Ovaries.  Patient informed of the findings on exam.  Counseling done on management of large Fibroids/Ovarian mass.  Management plan per CBC and Abdomino-Pelvic US findings. - CBC - US Transvaginal Non-OB; Future  2. Encounter for surveillance of transdermal patch hormonal contraceptive device Continue on Xulane until [place].  Will probably change to a Progestin-only contraceptive per US findings.  3. Dizzy spells R/O anemia and Thyroid dysfunction. - CBC - TSH  Other orders - magnesium 30 MG tablet; Take 30 mg by mouth 2 (two) times daily. - Collagen-Vitamin C-Biotin (COLLAGEN 1500/C PO); Take by mouth. - vitamin k 100 MCG tablet; Take 100 mcg by mouth daily. - Ginger, Zingiber officinalis, (GINGER ROOT) 250 MG CAPS; Take by mouth.  [name] MD, 10:06 AM 03/08/2020  Kristina Johnston 50 y.o.   Chief Complaint  Patient presents with  . Hypertension    Per patient to discuss blood pressure. Patient states physical, too.    HISTORY OF PRESENT ILLNESS: This is a 50 y.o. female with history of hypertension here for follow-up. Blood pressure readings at home 140s over 80s.  Compliant with amlodipine 5 mg daily. Recent visit with GYN Dr. Reviewed office visits above. Patient has history of uterine fibroid, on Xulane patch. Also has history of bipolar disorder.  Doing well.  On medications. Needs referral for colonoscopy. Had normal mammogram done last year. No other complaints or medical concerns today.  HPI   Prior to Admission medications   Medication Sig Start Date End Date Taking? Authorizing Provider  amLODipine (NORVASC) 5 MG tablet Take 1  tablet (5 mg total) by mouth daily. 11/29/19   Horald Pollen, MD  Ascorbic Acid (VITAMIN C PO) Take by mouth daily.    [provider]  calcium-vitamin D (OSCAL WITH D) 250-125 MG-UNIT tablet Take 1 tablet by mouth daily.    [provider]  Cholecalciferol (VITAMIN D3 PO) Take by mouth daily.    [provider]  Collagen-Vitamin C-Biotin (COLLAGEN 1500/C PO) Take by mouth.    [provider]  ferrous sulfate 220 (44 Fe) MG/5ML solution Take 220 mg by mouth 2 (two) times daily. 03/28/19   [provider]  Ginger, Zingiber officinalis, (GINGER ROOT) 250 MG CAPS Take by mouth.    [provider]  haloperidol (HALDOL) 2 MG/ML solution Take 2 mg by mouth daily.  08/25/17   [provider]  loratadine (CLARITIN) 5 MG/5ML syrup Take by mouth daily.    [provider]  magnesium 30 MG tablet Take 30 mg by mouth 2 (two) times daily.    [provider]  Multiple Vitamins-Minerals (ADULT ONE DAILY GUMMIES PO) Take 2 tablets by mouth daily.    [provider]  Multiple Vitamins-Minerals (ZINC PO) Take by mouth daily.    [provider]  Omega-3 Fatty Acids (FISH OIL PO) Take by mouth daily.    [provider]  Potassium (POTASSIMIN PO) Take by mouth daily.    [provider]  traMADol (ULTRAM) 50 MG tablet Take 1 tablet (50 mg total) by mouth every 6 (six) hours as needed for severe pain. 09/12/17   Long, Wonda Olds, MD  triamcinolone (KENALOG) 0.1 % APPLY TO SKIN TWICE A DAY FOR NO MORE THAN 2 WEEKS 11/29/19   Horald Pollen, MD  Valproate Sodium (DEPAKENE) 250 MG/5ML SOLN solution Take 2 mLs by mouth 2 (two) times daily.     [provider]  vitamin B-12 (CYANOCOBALAMIN) 250 MCG tablet Take 250 mcg by mouth daily.    [provider]  vitamin k 100 MCG tablet Take 100 mcg by mouth daily.    [provider]  Marilu Favre 150-35 MCG/24HR transdermal patch APPLY 1  PATCH TOPICALLY TO THE SKIN 1 TIME A WEEK 12/19/19   Horald Pollen, MD    No Known Allergies  Patient Active Problem List   Diagnosis Date Noted  . Essential hypertension 06/06/2017  . Bipolar disorder ([place]) 04/08/2017  . Depression 03/17/2011    Past Medical History:  Diagnosis Date  . Anxiety   . Depression   . Paranoid schizophrenia ([place])     No past surgical history on file.  Social History   Socioeconomic History  . Marital status: Divorced    Spouse name: Not on file  . Number of children: Not on file  . Years of education: Not on file  . Highest education level: Not on file  Occupational History  . Not on file  Tobacco Use  . Smoking status: Never Smoker  . Smokeless tobacco: Never Used  Vaping Use  . Vaping Use: Never used  Substance and Sexual Activity  . Alcohol use: No  . Drug use: No  . Sexual activity: Not Currently    Partners: Male    Birth control/protection: Patch    Comment: 1st intercourse-19, partners- 35, divorced  Other Topics Concern  . Not on file  Social History Narrative  . Not on file   Social Determinants of Health   Financial Resource Strain: Not on file  Food Insecurity: Not on file  Transportation Needs: Not on file  Physical Activity: Not on file  Stress: Not on file  Social Connections: Not on file  Intimate Partner Violence: Not on file    Family History  Problem Relation Age of Onset  . Heart disease Mother   . Hyperlipidemia Mother   . Mental illness Mother   . Diabetes Father   . Hypertension Sister   . Hyperlipidemia Brother   . Hypertension Brother   . Cancer Maternal Grandmother   . Cancer Maternal Grandfather   . Hyperlipidemia Maternal Grandfather   . Hypertension Maternal Grandfather   . Breast cancer Maternal Grandfather   . Cancer Paternal Grandmother 75       breast cancer  . Hyperlipidemia Paternal Grandfather   . Hypertension Paternal Grandfather   . Cancer Maternal Aunt 40       breast  cancer  . Breast cancer Maternal Aunt   . Breast cancer Cousin      Review of Systems  Constitutional: Negative.  Negative for chills, fever and weight loss.  HENT: Negative.  Negative for congestion and sore throat.   Respiratory: Negative.  Negative for cough and shortness of breath.   Cardiovascular: Negative.  Negative for chest pain and palpitations.  Gastrointestinal: Negative.  Negative for abdominal pain, blood in stool, diarrhea, melena, nausea and vomiting.  Genitourinary: Negative.  Negative for dysuria and hematuria.  Musculoskeletal: Negative.  Negative for back pain, myalgias and neck pain.  Skin: Negative.  Negative for rash.  Neurological: Negative.  Negative for dizziness and headaches.  All other systems reviewed and  are negative.   Today's Vitals   04/23/20 1114 04/23/20 1151  BP: 140/86 138/84  Pulse: 91   Temp: 98.8 F (37.1 C)   TempSrc: Oral   SpO2: 98%   Weight: 175 lb 9.6 oz (79.7 kg)   Height: 5\' 5"  (1.651 m)    Body mass index is 29.22 kg/m.  Physical Exam Vitals reviewed.  Constitutional:      Appearance: Normal appearance.  HENT:     Head: Normocephalic.     Mouth/Throat:     Mouth: Mucous membranes are moist.     Pharynx: Oropharynx is clear.  Eyes:     Extraocular Movements: Extraocular movements intact.     Conjunctiva/sclera: Conjunctivae normal.     Pupils: Pupils are equal, round, and reactive to light.  Cardiovascular:     Rate and Rhythm: Normal rate and regular rhythm.     Pulses: Normal pulses.     Heart sounds: Normal heart sounds.  Pulmonary:     Effort: Pulmonary effort is normal.     Breath sounds: Normal breath sounds.  Abdominal:     General: There is no distension.     Palpations: Abdomen is soft.     Tenderness: There is no abdominal tenderness.  Musculoskeletal:        General: Normal range of motion.     Cervical back: Normal range of motion and neck supple. No tenderness.  Lymphadenopathy:     Cervical: No  cervical adenopathy.  Skin:    General: Skin is warm and dry.     Capillary Refill: Capillary refill takes less than 2 seconds.  Neurological:     General: No focal deficit present.     Mental Status: She is alert and oriented to person, place, and time.  Psychiatric:        Mood and Affect: Mood normal.        Behavior: Behavior normal.    A total of 30 minutes was spent with the patient, greater than 50% of which was in counseling/coordination of care regarding hypertension and cardiovascular risks associated with this condition, review of all medications and changes made, review of most recent gynecologist office visit, review of most recent blood work results including CBC and TSH, health maintenance items including need for colonoscopy, education on nutrition, documentation, prognosis, and need for follow-up.   ASSESSMENT & PLAN: Essential hypertension Elevated blood pressure at home and in the office. Will increase amlodipine to 10 mg daily. Diet and nutrition discussed. Advised to monitor blood pressure readings at home daily for the next several weeks. Contact the office if numbers persistently elevated. Follow-up in 6 months.  Earlier as needed.  Kristina Johnston was seen today for hypertension.  Diagnoses and all orders for this visit:  Essential hypertension -     Comprehensive metabolic panel -     Lipid panel  Bipolar disorder, in full remission, most recent episode depressed ([place])  Fibroid tumor    Patient Instructions   Increase amlodipine to 10 mg daily. Monitor blood pressure readings at home once a day for the next several weeks. Follow-up in 6 months. [email or web address]">  DASH Eating Plan DASH stands for Dietary Approaches to Stop Hypertension. The DASH eating plan is a healthy eating plan that has been shown to:  Reduce high blood pressure (hypertension).  Reduce your risk for type 2 diabetes, heart disease,  and stroke.  Help with weight loss. What are tips for following this plan? Reading food labels  Check food  labels for the amount of salt (sodium) per serving. Choose foods with less than 5 percent of the Daily Value of sodium. Generally, foods with less than 300 milligrams (mg) of sodium per serving fit into this eating plan.  To find whole grains, look for the word "whole" as the first word in the ingredient list. Shopping  Buy products labeled as "low-sodium" or "no salt added."  Buy fresh foods. Avoid canned foods and pre-made or frozen meals. Cooking  Avoid adding salt when cooking. Use salt-free seasonings or herbs instead of table salt or sea salt. Check with your health care provider or pharmacist before using salt substitutes.  Do not fry foods. Cook foods using healthy methods such as baking, boiling, grilling, roasting, and broiling instead.  Cook with heart-healthy oils, such as olive, canola, avocado, soybean, or sunflower oil. Meal planning  Eat a balanced diet that includes: ? 4 or more servings of fruits and 4 or more servings of vegetables each day. Try to fill one-half of your plate with fruits and vegetables. ? 6-8 servings of whole grains each day. ? Less than 6 oz (170 g) of lean meat, poultry, or fish each day. A 3-oz (85-g) serving of meat is about the same size as a deck of cards. One egg equals 1 oz (28 g). ? 2-3 servings of low-fat dairy each day. One serving is 1 cup (237 mL). ? 1 serving of nuts, seeds, or beans 5 times each week. ? 2-3 servings of heart-healthy fats. Healthy fats called omega-3 fatty acids are found in foods such as walnuts, flaxseeds, fortified milks, and eggs. These fats are also found in cold-water fish, such as sardines, salmon, and mackerel.  Limit how much you eat of: ? Canned or prepackaged foods. ? Food that is high in trans fat, such as some fried foods. ? Food that is high in saturated fat, such as fatty meat. ? Desserts and  other sweets, sugary drinks, and other foods with added sugar. ? Full-fat dairy products.  Do not salt foods before eating.  Do not eat more than 4 egg yolks a week.  Try to eat at least 2 vegetarian meals a week.  Eat more home-cooked food and less restaurant, buffet, and fast food.   Lifestyle  When eating at a restaurant, ask that your food be prepared with less salt or no salt, if possible.  If you drink alcohol: ? Limit how much you use to:  0-1 drink a day for women who are not pregnant.  0-2 drinks a day for men. ? Be aware of how much alcohol is in your drink. In the [place] one drink equals one 12 oz bottle of beer (355 mL), one 5 oz glass of wine (148 mL), or one 1 oz glass of hard liquor (44 mL). General information  Avoid eating more than 2,300 mg of salt a day. If you have hypertension, you may need to reduce your sodium intake to 1,500 mg a day.  Work with your health care provider to maintain a healthy body weight or to lose weight. Ask what an ideal weight is for you.  Get at least 30 minutes of exercise that causes your heart to beat faster (aerobic exercise) most days of the week. Activities may include walking, swimming, or biking.  Work with your health care provider or dietitian to adjust your eating plan to your individual calorie needs. What foods should I eat? Fruits All fresh, dried, or frozen fruit. Canned fruit  in natural juice (without added sugar). Vegetables Fresh or frozen vegetables (raw, steamed, roasted, or grilled). Low-sodium or reduced-sodium tomato and vegetable juice. Low-sodium or reduced-sodium tomato sauce and tomato paste. Low-sodium or reduced-sodium canned vegetables. Grains Whole-grain or whole-wheat bread. Whole-grain or whole-wheat pasta. Brown rice. [place]. Bulgur. Whole-grain and low-sodium cereals. Pita bread. Low-fat, low-sodium crackers. Whole-wheat flour tortillas. Meats and other proteins Skinless chicken or [place].  Ground chicken or [place]. Pork with fat trimmed off. Fish and seafood. Egg whites. Dried beans, peas, or lentils. Unsalted nuts, nut butters, and seeds. Unsalted canned beans. Lean cuts of beef with fat trimmed off. Low-sodium, lean precooked or cured meat, such as sausages or meat loaves. Dairy Low-fat (1%) or fat-free (skim) milk. Reduced-fat, low-fat, or fat-free cheeses. Nonfat, low-sodium ricotta or cottage cheese. Low-fat or nonfat yogurt. Low-fat, low-sodium cheese. Fats and oils Soft margarine without trans fats. Vegetable oil. Reduced-fat, low-fat, or light mayonnaise and salad dressings (reduced-sodium). Canola, safflower, olive, avocado, soybean, and sunflower oils. Avocado. Seasonings and condiments Herbs. Spices. Seasoning mixes without salt. Other foods Unsalted popcorn and pretzels. Fat-free sweets. The items listed above may not be a complete list of foods and beverages you can eat. Contact a dietitian for more information. What foods should I avoid? Fruits Canned fruit in a light or heavy syrup. Fried fruit. Fruit in cream or butter sauce. Vegetables Creamed or fried vegetables. Vegetables in a cheese sauce. Regular canned vegetables (not low-sodium or reduced-sodium). Regular canned tomato sauce and paste (not low-sodium or reduced-sodium). Regular tomato and vegetable juice (not low-sodium or reduced-sodium). Angie Fava. Olives. Grains Baked goods made with fat, such as croissants, muffins, or some breads. Dry pasta or rice meal packs. Meats and other proteins Fatty cuts of meat. Ribs. Fried meat. Berniece Salines. Bologna, salami, and other precooked or cured meats, such as sausages or meat loaves. Fat from the back of a pig (fatback). Bratwurst. Salted nuts and seeds. Canned beans with added salt. Canned or smoked fish. Whole eggs or egg yolks. Chicken or [place] with skin. Dairy Whole or 2% milk, cream, and half-and-half. Whole or full-fat cream cheese. Whole-fat or sweetened yogurt.  Full-fat cheese. Nondairy creamers. Whipped toppings. Processed cheese and cheese spreads. Fats and oils Butter. Stick margarine. Lard. Shortening. Ghee. Bacon fat. Tropical oils, such as coconut, palm kernel, or palm oil. Seasonings and condiments Onion salt, garlic salt, seasoned salt, table salt, and sea salt. Worcestershire sauce. Tartar sauce. Barbecue sauce. Teriyaki sauce. Soy sauce, including reduced-sodium. Steak sauce. Canned and packaged gravies. Fish sauce. Oyster sauce. Cocktail sauce. Store-bought horseradish. Ketchup. Mustard. Meat flavorings and tenderizers. Bouillon cubes. Hot sauces. Pre-made or packaged marinades. Pre-made or packaged taco seasonings. Relishes. Regular salad dressings. Other foods Salted popcorn and pretzels. The items listed above may not be a complete list of foods and beverages you should avoid. Contact a dietitian for more information. Where to find more information  National Heart, Lung, and Blood Institute: https://wilson-eaton.com/  American Heart Association: www.heart.org  Academy of Nutrition and Dietetics: www.eatright.[place]: www.kidney.org Summary  The DASH eating plan is a healthy eating plan that has been shown to reduce high blood pressure (hypertension). It may also reduce your risk for type 2 diabetes, heart disease, and stroke.  When on the DASH eating plan, aim to eat more fresh fruits and vegetables, whole grains, lean proteins, low-fat dairy, and heart-healthy fats.  With the DASH eating plan, you should limit salt (sodium) intake to 2,300 mg a day. If you have  hypertension, you may need to reduce your sodium intake to 1,500 mg a day.  Work with your health care provider or dietitian to adjust your eating plan to your individual calorie needs. This information is not intended to replace advice given to you by your health care provider. Make sure you discuss any questions you have with your health care  provider. Document Revised: 12/03/2018 Document Reviewed: 12/03/2018 Elsevier Patient Education  2021 [place].   Hypertension, Adult High blood pressure (hypertension) is when the force of blood pumping through the arteries is too strong. The arteries are the blood vessels that carry blood from the heart throughout the body. Hypertension forces the heart to work harder to pump blood and may cause arteries to become narrow or stiff. Untreated or uncontrolled hypertension can cause a heart attack, heart failure, a stroke, kidney disease, and other problems. A blood pressure reading consists of a higher number over a lower number. Ideally, your blood pressure should be below 120/80. The first ("top") number is called the systolic pressure. It is a measure of the pressure in your arteries as your heart beats. The second ("bottom") number is called the diastolic pressure. It is a measure of the pressure in your arteries as the heart relaxes. What are the causes? The exact cause of this condition is not known. There are some conditions that result in or are related to high blood pressure. What increases the risk? Some risk factors for high blood pressure are under your control. The following factors may make you more likely to develop this condition:  Smoking.  Having type 2 diabetes mellitus, high cholesterol, or both.  Not getting enough exercise or physical activity.  Being overweight.  Having too much fat, sugar, calories, or salt (sodium) in your diet.  Drinking too much alcohol. Some risk factors for high blood pressure may be difficult or impossible to change. Some of these factors include:  Having chronic kidney disease.  Having a family history of high blood pressure.  Age. Risk increases with age.  Race. You may be at higher risk if you are African American.  Gender. Men are at higher risk than women before age 22. After age 36, women are at higher risk than men.  Having  obstructive sleep apnea.  Stress. What are the signs or symptoms? High blood pressure may not cause symptoms. Very high blood pressure (hypertensive crisis) may cause:  Headache.  Anxiety.  Shortness of breath.  Nosebleed.  Nausea and vomiting.  Vision changes.  Severe chest pain.  Seizures. How is this diagnosed? This condition is diagnosed by measuring your blood pressure while you are seated, with your arm resting on a flat surface, your legs uncrossed, and your feet flat on the floor. The cuff of the blood pressure monitor will be placed directly against the skin of your upper arm at the level of your heart. It should be measured at least twice using the same arm. Certain conditions can cause a difference in blood pressure between your right and left arms. Certain factors can cause blood pressure readings to be lower or higher than normal for a short period of time:  When your blood pressure is higher when you are in a health care provider's office than when you are at home, this is called white coat hypertension. Most people with this condition do not need medicines.  When your blood pressure is higher at home than when you are in a health care provider's office, this is  called masked hypertension. Most people with this condition may need medicines to control blood pressure. If you have a high blood pressure reading during one visit or you have normal blood pressure with other risk factors, you may be asked to:  Return on a different day to have your blood pressure checked again.  Monitor your blood pressure at home for 1 week or longer. If you are diagnosed with hypertension, you may have other blood or imaging tests to help your health care provider understand your overall risk for other conditions. How is this treated? This condition is treated by making healthy lifestyle changes, such as eating healthy foods, exercising more, and reducing your alcohol intake. Your health  care provider may prescribe medicine if lifestyle changes are not enough to get your blood pressure under control, and if:  Your systolic blood pressure is above 130.  Your diastolic blood pressure is above 80. Your personal target blood pressure may vary depending on your medical conditions, your age, and other factors. Follow these instructions at home: Eating and drinking  Eat a diet that is high in fiber and potassium, and low in sodium, added sugar, and fat. An example eating plan is called the DASH (Dietary Approaches to Stop Hypertension) diet. To eat this way: ? Eat plenty of fresh fruits and vegetables. Try to fill one half of your plate at each meal with fruits and vegetables. ? Eat whole grains, such as whole-wheat pasta, brown rice, or whole-grain bread. Fill about one fourth of your plate with whole grains. ? Eat or drink low-fat dairy products, such as skim milk or low-fat yogurt. ? Avoid fatty cuts of meat, processed or cured meats, and poultry with skin. Fill about one fourth of your plate with lean proteins, such as fish, chicken without skin, beans, eggs, or tofu. ? Avoid pre-made and processed foods. These tend to be higher in sodium, added sugar, and fat.  Reduce your daily sodium intake. Most people with hypertension should eat less than 1,500 mg of sodium a day.  Do not drink alcohol if: ? Your health care provider tells you not to drink. ? You are pregnant, may be pregnant, or are planning to become pregnant.  If you drink alcohol: ? Limit how much you use to:  0-1 drink a day for women.  0-2 drinks a day for men. ? Be aware of how much alcohol is in your drink. In the [place] one drink equals one 12 oz bottle of beer (355 mL), one 5 oz glass of wine (148 mL), or one 1 oz glass of hard liquor (44 mL).   Lifestyle  Work with your health care provider to maintain a healthy body weight or to lose weight. Ask what an ideal weight is for you.  Get at least 30 minutes  of exercise most days of the week. Activities may include walking, swimming, or biking.  Include exercise to strengthen your muscles (resistance exercise), such as Pilates or lifting weights, as part of your weekly exercise routine. Try to do these types of exercises for 30 minutes at least 3 days a week.  Do not use any products that contain nicotine or tobacco, such as cigarettes, e-cigarettes, and chewing tobacco. If you need help quitting, ask your health care provider.  Monitor your blood pressure at home as told by your health care provider.  Keep all follow-up visits as told by your health care provider. This is important.   Medicines  Take over-the-counter and prescription  medicines only as told by your health care provider. Follow directions carefully. Blood pressure medicines must be taken as prescribed.  Do not skip doses of blood pressure medicine. Doing this puts you at risk for problems and can make the medicine less effective.  Ask your health care provider about side effects or reactions to medicines that you should watch for. Contact a health care provider if you:  Think you are having a reaction to a medicine you are taking.  Have headaches that keep coming back (recurring).  Feel dizzy.  Have swelling in your ankles.  Have trouble with your vision. Get help right away if you:  Develop a severe headache or confusion.  Have unusual weakness or numbness.  Feel faint.  Have severe pain in your chest or abdomen.  Vomit repeatedly.  Have trouble breathing. Summary  Hypertension is when the force of blood pumping through your arteries is too strong. If this condition is not controlled, it may put you at risk for serious complications.  Your personal target blood pressure may vary depending on your medical conditions, your age, and other factors. For most people, a normal blood pressure is less than 120/80.  Hypertension is treated with lifestyle changes,  medicines, or a combination of both. Lifestyle changes include losing weight, eating a healthy, low-sodium diet, exercising more, and limiting alcohol. This information is not intended to replace advice given to you by your health care provider. Make sure you discuss any questions you have with your health care provider. Document Revised: 09/09/2017 Document Reviewed: 09/09/2017 Elsevier Patient Education  2021 [place], MD [facility]

## 2020-05-04 ENCOUNTER — Telehealth: Payer: Self-pay | Admitting: *Deleted

## 2020-05-04 NOTE — Telephone Encounter (Signed)
Patient called asking if Dr.Lavoie would refill Xulane patches for her. Patient said her PCP prescribed patches, patient had office visit on 03/08/20 for fibroids and U/S was ordered however patient has not been able to schedule due finances and personal family issues. I recommended she schedule ultrasound when able, she reports she may have enough patches until August. Patient said she will schedule ultrasound at some point, I explained Dr.Lavoie will want U/S and then the plan was to maybe switch to progestin only pill per office note. Patient verbalized she understood.

## 2020-05-11 ENCOUNTER — Other Ambulatory Visit: Payer: Self-pay | Admitting: Emergency Medicine

## 2020-06-22 ENCOUNTER — Other Ambulatory Visit: Payer: Self-pay

## 2020-06-26 ENCOUNTER — Other Ambulatory Visit: Payer: Medicare Other

## 2020-06-26 ENCOUNTER — Other Ambulatory Visit: Payer: BC Managed Care – PPO | Admitting: Obstetrics & Gynecology

## 2020-06-27 ENCOUNTER — Telehealth: Payer: Self-pay

## 2020-06-27 NOTE — Telephone Encounter (Signed)
Patient called today asking if she can get a abdominal US she states that she suffers from depression and having anxiety when it comes to procedures and transvaginal US.    Dr. Marguerita Merles had ordered u/s with Feb 2022 visit to assess fibroids.  03/08/20 ". Fibroids Probable very large Fibroids/Ovarian mass not ruled out.  Abdominal/pelvic fullness, but no severe pain and no menorrhagia on Xulane patch for many years.  Will r/o anemia with a CBC today.  Continue on Xulane for now.  F/U Abdomino-Pelvic US to assess the large mass/Uterus and Ovaries.  Patient informed of the findings on exam.  Counseling done on management of large Fibroids/Ovarian mass.  Management plan per CBC and Abdomino-Pelvic US findings. - CBC - US Transvaginal Non-OB; Future"

## 2020-07-02 ENCOUNTER — Telehealth: Payer: Self-pay | Admitting: Emergency Medicine

## 2020-07-02 NOTE — Telephone Encounter (Signed)
Called pt to schedule AW with [facility].Pt stated she will call back to schedule. Please schedul this appt if pt calls the office.

## 2020-07-02 NOTE — Telephone Encounter (Signed)
Patient called and said she is needing a refill for amLODipine (NORVASC) 5 MG tablet. She said that she had spoken with Dr. Mitchel Honour about increasing the medication in April. She said that she has been watching her diet. She was wondering if she needed a refill on the 5mg  or if a new prescription needs to be called in. She is requesting a call back. Please advise   [id number] - [place], [place] [address] AT [facility]

## 2020-07-03 ENCOUNTER — Encounter: Payer: Medicare Other | Admitting: Emergency Medicine

## 2020-07-03 NOTE — Telephone Encounter (Signed)
   Patient called again.  She is requesting a call back and can be reached at [phone]. Please advise

## 2020-07-04 ENCOUNTER — Other Ambulatory Visit: Payer: Self-pay

## 2020-07-04 ENCOUNTER — Other Ambulatory Visit: Payer: Self-pay | Admitting: Obstetrics & Gynecology

## 2020-07-04 ENCOUNTER — Ambulatory Visit (INDEPENDENT_AMBULATORY_CARE_PROVIDER_SITE_OTHER): Payer: Medicare Other | Admitting: Obstetrics & Gynecology

## 2020-07-04 ENCOUNTER — Encounter: Payer: Self-pay | Admitting: Obstetrics & Gynecology

## 2020-07-04 ENCOUNTER — Telehealth: Payer: Self-pay

## 2020-07-04 ENCOUNTER — Ambulatory Visit (INDEPENDENT_AMBULATORY_CARE_PROVIDER_SITE_OTHER): Payer: Medicare Other

## 2020-07-04 VITALS — BP 132/80

## 2020-07-04 DIAGNOSIS — Z30011 Encounter for initial prescription of contraceptive pills: Secondary | ICD-10-CM

## 2020-07-04 DIAGNOSIS — N852 Hypertrophy of uterus: Secondary | ICD-10-CM

## 2020-07-04 DIAGNOSIS — D219 Benign neoplasm of connective and other soft tissue, unspecified: Secondary | ICD-10-CM

## 2020-07-04 MED ORDER — NORETHINDRONE 0.35 MG PO TABS: 1.0000 | ORAL_TABLET | Freq: Every day | ORAL | 4 refills | Status: DC

## 2020-07-04 NOTE — Telephone Encounter (Signed)
Patient called to inquire when she will start new birth control pill/Micronor.  She is using the Xulane patch and is relying on it for birth control.  She takes it off this Sunday and her period usually starts on Thursday and she restarts it the following Sunday/  She asked when she should start the Micronor??

## 2020-07-04 NOTE — Progress Notes (Signed)
    Yerlin Gasparyan 1970-04-29 [id number]        50 y.o.  G1P1L1  RP: Large nodular uterus on exam c/w Fibroids for Pelvic US  HPI: Well on Xulane.  No breakthrough bleeding.  Normal vaginal secretions.  Hb normal in 02/2020.  No pelvic pain.   OB History  Gravida Para Term Preterm AB Living  1 1       1   SAB IAB Ectopic Multiple Live Births               # Outcome Date GA Lbr Len/2nd Weight Sex Delivery Anes PTL Lv  1 Para             Past medical history,surgical history, problem list, medications, allergies, family history and social history were all reviewed and documented in the EPIC chart.   Directed ROS with pertinent positives and negatives documented in the history of present illness/assessment and plan.  Exam:  Vitals:   07/04/20 1008  BP: 132/80   General appearance:  Normal  Pelvic US today: T/A images.  Anteverted uterus enlarged with fibroids, the largest measured at 13.2 cm mid fundal with degenerative appearance.  The overall uterine size is measured at 24.77 x 17.53 x 17.43 cm.  The endometrial lining is measured at 8.89 mm with limited views of the endometrium seen due to large fibroid.  Neither ovary was well visualized due to very enlarged uterine size.  No adnexal mass seen.  No free fluid in the posterior cul-de-sac.   Assessment/Plan:  50 y.o. G1P1L1  1. Fibroids Pelvic ultrasound findings thoroughly reviewed with patient.  Very large uterine uterus due to fibroids.  Fibroids of benign appearance with degeneration.  Given that patient is basically asymptomatic at age 50, decision to change contraception to a progestin only pill.  Decision to observe.  We will repeat a pelvic ultrasound to confirm stability in 6 months. - US Transvaginal Non-OB; Future  2. Encounter for initial prescription of contraceptive pills Stop Xulane.  Decision to start on the progestin only birth control pill.  No contraindication.  Prescription sent to pharmacy.  Other  orders - norethindrone (MICRONOR) 0.35 MG tablet; Take 1 tablet (0.35 mg total) by mouth daily.   [name] MD, 10:55 AM 07/04/2020

## 2020-07-05 ENCOUNTER — Other Ambulatory Visit: Payer: Self-pay | Admitting: *Deleted

## 2020-07-05 ENCOUNTER — Telehealth: Payer: Self-pay

## 2020-07-05 ENCOUNTER — Telehealth: Payer: Self-pay | Admitting: *Deleted

## 2020-07-05 ENCOUNTER — Other Ambulatory Visit: Payer: Self-pay | Admitting: Emergency Medicine

## 2020-07-05 DIAGNOSIS — I10 Essential (primary) hypertension: Secondary | ICD-10-CM

## 2020-07-05 MED ORDER — AMLODIPINE BESYLATE 10 MG PO TABS: 10.0000 mg | ORAL_TABLET | Freq: Every day | ORAL | 3 refills | Status: DC

## 2020-07-05 NOTE — Telephone Encounter (Signed)
Patient had an ultrasound yesterday and she said it was performed abdominally.  She is to return in December for another u/s and wants to confirm that it will be done abdominally as well.  She states she is uncomfortable with transvaginal and it makes her very anxious to think about having it.

## 2020-07-05 NOTE — Telephone Encounter (Signed)
New prescription for amlodipine 10 mg sent to pharmacy of record.  Thanks.

## 2020-07-06 NOTE — Telephone Encounter (Signed)
Spoke to patient Kristina Johnston 5 mg increased to 10 mg by Dr Mitchel Honour. Refill sent to [name] 07/05/2020. Per patient she will pick up today.

## 2020-07-08 ENCOUNTER — Encounter: Payer: Self-pay | Admitting: Obstetrics & Gynecology

## 2020-07-09 NOTE — Telephone Encounter (Signed)
I let patient know that Dr. Marguerita Merles is fine with her having abd u/s.  Added to u/s order and appt.

## 2020-07-09 NOTE — Telephone Encounter (Signed)
Patient informed. 

## 2020-07-09 NOTE — Telephone Encounter (Signed)
I did review full bladder instructions with the patient and reminded her she will need this for her visit.

## 2020-07-10 NOTE — Telephone Encounter (Signed)
U/s is scheduled with a note "transabdominal per Dr. Marguerita Merles".

## 2020-07-11 ENCOUNTER — Telehealth: Payer: Self-pay | Admitting: Emergency Medicine

## 2020-07-11 NOTE — Telephone Encounter (Signed)
Patient needs to get a refill on ferrous sulfate 220 (44 Fe) MG/5ML solution   Usually comes from her therapist but her therapist would feel more comfortable having the provider prescribe it now.  Please advise.

## 2020-07-16 ENCOUNTER — Other Ambulatory Visit: Payer: Self-pay | Admitting: Emergency Medicine

## 2020-07-16 MED ORDER — FERROUS SULFATE 220 (44 FE) MG/5ML PO ELIX: 220.0000 mg | ORAL_SOLUTION | Freq: Two times a day (BID) | ORAL | 3 refills | Status: DC

## 2020-07-16 NOTE — Telephone Encounter (Signed)
Not a problem.  New prescription sent in to pharmacy of record.  Her anemia however will need to be addressed during the next office visit and colonoscopy ordered.  Thanks.

## 2020-07-17 ENCOUNTER — Other Ambulatory Visit: Payer: Medicare Other | Admitting: Obstetrics & Gynecology

## 2020-07-17 ENCOUNTER — Other Ambulatory Visit: Payer: Medicare Other

## 2020-07-17 NOTE — Telephone Encounter (Signed)
okay

## 2020-07-17 NOTE — Telephone Encounter (Signed)
Pt notified of MD response.   Pt informed that colonoscopy should be ordered during 07/31/20 AWV.  PCP will address anemia on 08/23/20.  Pt verb understanding.

## 2020-07-31 ENCOUNTER — Ambulatory Visit: Payer: Medicare Other

## 2020-08-31 ENCOUNTER — Emergency Department (HOSPITAL_COMMUNITY)
Admission: EM | Admit: 2020-08-31 | Discharge: 2020-08-31 | Disposition: A | Discharge status: Home / Self Care | Payer: Medicare Other | Attending: Emergency Medicine | Admitting: Emergency Medicine

## 2020-08-31 ENCOUNTER — Other Ambulatory Visit: Payer: Self-pay

## 2020-08-31 ENCOUNTER — Encounter (HOSPITAL_COMMUNITY): Payer: Self-pay

## 2020-08-31 ENCOUNTER — Emergency Department (HOSPITAL_COMMUNITY): Payer: Medicare Other

## 2020-08-31 ENCOUNTER — Telehealth: Payer: Self-pay | Admitting: Emergency Medicine

## 2020-08-31 DIAGNOSIS — D259 Leiomyoma of uterus, unspecified: Secondary | ICD-10-CM | POA: Insufficient documentation

## 2020-08-31 DIAGNOSIS — Z79899 Other long term (current) drug therapy: Secondary | ICD-10-CM | POA: Insufficient documentation

## 2020-08-31 DIAGNOSIS — I10 Essential (primary) hypertension: Secondary | ICD-10-CM | POA: Diagnosis not present

## 2020-08-31 DIAGNOSIS — R140 Abdominal distension (gaseous): Secondary | ICD-10-CM | POA: Diagnosis present

## 2020-08-31 DIAGNOSIS — R109 Unspecified abdominal pain: Secondary | ICD-10-CM | POA: Diagnosis not present

## 2020-08-31 LAB — CBC WITH DIFFERENTIAL/PLATELET
Abs Immature Granulocytes: 0.01 10*3/uL (ref 0.00–0.07)
Basophils Absolute: 0 10*3/uL (ref 0.0–0.1)
Basophils Relative: 1 %
Eosinophils Absolute: 0.1 10*3/uL (ref 0.0–0.5)
Eosinophils Relative: 3 %
HCT: 37.5 % (ref 36.0–46.0)
Hemoglobin: 12.7 g/dL (ref 12.0–15.0)
Immature Granulocytes: 0 %
Lymphocytes Relative: 35 %
Lymphs Abs: 1.6 10*3/uL (ref 0.7–4.0)
MCH: 31.5 pg (ref 26.0–34.0)
MCHC: 33.9 g/dL (ref 30.0–36.0)
MCV: 93.1 fL (ref 80.0–100.0)
Monocytes Absolute: 0.5 10*3/uL (ref 0.1–1.0)
Monocytes Relative: 10 %
Neutro Abs: 2.3 10*3/uL (ref 1.7–7.7)
Neutrophils Relative %: 51 %
Platelets: 253 10*3/uL (ref 150–400)
RBC: 4.03 MIL/uL (ref 3.87–5.11)
RDW: 13.7 % (ref 11.5–15.5)
WBC: 4.6 10*3/uL (ref 4.0–10.5)
nRBC: 0 % (ref 0.0–0.2)

## 2020-08-31 LAB — I-STAT BETA HCG BLOOD, ED (MC, WL, AP ONLY): I-stat hCG, quantitative: <5 m[IU]/mL (ref <5)

## 2020-08-31 LAB — COMPREHENSIVE METABOLIC PANEL
ALT: 28 U/L (ref 0–44)
AST: 22 U/L (ref 15–41)
Albumin: 4.2 g/dL (ref 3.5–5.0)
Alkaline Phosphatase: 39 U/L (ref 38–126)
Anion gap: 10 (ref 5–15)
BUN: 8 mg/dL (ref 6–20)
CO2: 24 mmol/L (ref 22–32)
Calcium: 9.2 mg/dL (ref 8.9–10.3)
Chloride: 109 mmol/L (ref 98–111)
Creatinine, Ser: 0.76 mg/dL (ref 0.44–1.00)
GFR, Estimated: >60 mL/min (ref >60)
Glucose, Bld: 98 mg/dL (ref 70–99)
Potassium: 3.8 mmol/L (ref 3.5–5.1)
Sodium: 143 mmol/L (ref 135–145)
Total Bilirubin: 0.8 mg/dL (ref 0.3–1.2)
Total Protein: 7.5 g/dL (ref 6.5–8.1)

## 2020-08-31 LAB — LIPASE, BLOOD: Lipase: 38 U/L (ref 11–51)

## 2020-08-31 MED ORDER — IOHEXOL 350 MG/ML SOLN
100.0000 mL | Freq: Once | INTRAVENOUS | Status: AC | PRN
  Administered 2020-08-31: 100 mL via INTRAVENOUS

## 2020-08-31 MED ORDER — SODIUM CHLORIDE 0.9 % IV BOLUS
1000.0000 mL | Freq: Once | INTRAVENOUS | Status: AC
  Administered 2020-08-31: 1000 mL via INTRAVENOUS

## 2020-08-31 NOTE — ED Triage Notes (Signed)
Patient c/o dizziness that "has worsened in the past 7 months." Patient denies any blurred vision.

## 2020-08-31 NOTE — Telephone Encounter (Signed)
Patient called in to notify that she is currently at [facility] ED  Says she is getting scan done to see if fibroids are progressing  Wanted to make provider aware

## 2020-08-31 NOTE — ED Provider Notes (Signed)
[facility] DEPT Provider Note   CSN: [id number] Arrival date & time: 08/31/20  0704    History Chief Complaint  Patient presents with   Dizziness    Kristina Johnston is a 50 y.o. female with past medical history significant for depression, schizophrenia, fibroids who presents for evaluation of abd distension. Initially told triage here for dizziness which patient denies any current dizziness. States that her dizziness has been an ongoing issue, following with PCP, has not changed and has not occurred in "a long time." No HA, weakness, numbness, syncope. States he abd is more distended than usual. Recently started menstrual cycle today. States "not heavy." Only spotting. Has not been taking her birthcontrol. Hx of anemia, not taking iron supplements. Followed by Dr. Dellis Filbert for uterine fibroids. [place] at early summer. No abd pain, just "bloating." No fever, chills, headache, lightness, dizziness, chest pain, shortness of breath, back pain, dysuria, hematuria, diarrhea, constipation.  Denies chance of pregnancy.  Rates her current pain a 0/10, described as bloating. Denies additional aggrieving or alleviating factors. No depression sx.  History obtained from patient and past medical records.  No interpreter used.  HPI     Past Medical History:  Diagnosis Date   Anxiety    Depression    Paranoid schizophrenia [facility])     Patient Active Problem List   Diagnosis Date Noted   Fibroid tumor 04/23/2020   Essential hypertension 06/06/2017   Bipolar disorder ([place]) 04/08/2017   Depression 03/17/2011    History reviewed. No pertinent surgical history.   OB History     Gravida  1   Para  1   Term      Preterm      AB      Living  1      SAB      IAB      Ectopic      Multiple      Live Births              Family History  Problem Relation Age of Onset   Heart disease Mother    Hyperlipidemia Mother    Mental illness Mother     Diabetes Father    Hypertension Sister    Hyperlipidemia Brother    Hypertension Brother    Cancer Maternal Grandmother    Cancer Maternal Grandfather    Hyperlipidemia Maternal Grandfather    Hypertension Maternal Grandfather    Breast cancer Maternal Grandfather    Cancer Paternal Grandmother 45       breast cancer   Hyperlipidemia Paternal Grandfather    Hypertension Paternal Grandfather    Cancer Maternal Aunt 70       breast cancer   Breast cancer Maternal Aunt    Breast cancer Cousin     Social History   Tobacco Use   Smoking status: Never   Smokeless tobacco: Never  Vaping Use   Vaping Use: Never used  Substance Use Topics   Alcohol use: No   Drug use: No    Home Medications Prior to Admission medications   Medication Sig Start Date End Date Taking? Authorizing Provider  amLODipine (NORVASC) 10 MG tablet Take 1 tablet (10 mg total) by mouth daily. 07/05/20   Horald Pollen, MD  Ascorbic Acid (VITAMIN C PO) Take by mouth daily.    [provider]  calcium-vitamin D (OSCAL WITH D) 250-125 MG-UNIT tablet Take 1 tablet by mouth daily.    [provider]  Cholecalciferol (VITAMIN D3 PO) Take by mouth daily.    [provider]  Collagen-Vitamin C-Biotin (COLLAGEN 1500/C PO) Take by mouth.    [provider]  ferrous sulfate 220 (44 Fe) MG/5ML solution Take 5 mLs (220 mg total) by mouth 2 (two) times daily. 07/16/20   Horald Pollen, MD  Ginger, Zingiber officinalis, (GINGER ROOT) 250 MG CAPS Take by mouth.    [provider]  haloperidol (HALDOL) 2 MG/ML solution Take 2 mg by mouth daily.  08/25/17   [provider]  loratadine (CLARITIN) 5 MG/5ML syrup Take by mouth daily.    [provider]  magnesium 30 MG tablet Take 30 mg by mouth 2 (two) times daily.    [provider]  Multiple Vitamins-Minerals (ADULT ONE DAILY GUMMIES PO) Take 2 tablets by mouth daily.    [provider]   Multiple Vitamins-Minerals (ZINC PO) Take by mouth daily.    [provider]  norethindrone (MICRONOR) 0.35 MG tablet Take 1 tablet (0.35 mg total) by mouth daily. 07/04/20   [name], MD  Omega-3 Fatty Acids (FISH OIL PO) Take by mouth daily.    [provider]  Potassium (POTASSIMIN PO) Take by mouth daily.    [provider]  triamcinolone (KENALOG) 0.1 % APPLY TO SKIN TWICE A DAY FOR NO MORE THAN 2 WEEKS 11/29/19   Horald Pollen, MD  Valproate Sodium (DEPAKENE) 250 MG/5ML SOLN solution Take 2 mLs by mouth 2 (two) times daily.     [provider]  vitamin B-12 (CYANOCOBALAMIN) 250 MCG tablet Take 250 mcg by mouth daily.    [provider]  vitamin k 100 MCG tablet Take 100 mcg by mouth daily.    [provider]    Allergies    Patient has no known allergies.  Review of Systems   Review of Systems  Constitutional: Negative.   HENT: Negative.    Respiratory: Negative.    Cardiovascular: Negative.   Gastrointestinal:  Positive for abdominal distention. Negative for blood in stool, constipation, diarrhea, nausea, rectal pain and vomiting.  Genitourinary: Negative.   Musculoskeletal: Negative.   Skin: Negative.   Neurological: Negative.   All other systems reviewed and are negative.  Physical Exam Updated Vital Signs BP 111/72 (BP Location: Right Arm)   Pulse 77   Temp 98.5 F (36.9 C) (Oral)   Resp 18   Ht '5\' 5"'$  (1.651 m)   Wt 74.8 kg   LMP 08/31/2020   SpO2 99%   BMI 27.46 kg/m   Physical Exam Vitals and nursing note reviewed.  Constitutional:      General: She is not in acute distress.    Appearance: She is well-developed. She is not ill-appearing, toxic-appearing or diaphoretic.  HENT:     Head: Normocephalic and atraumatic.     Nose: Nose normal.     Mouth/Throat:     Mouth: Mucous membranes are moist.  Eyes:     Pupils: Pupils are equal, round, and reactive to light.  Cardiovascular:      Rate and Rhythm: Normal rate.     Pulses: Normal pulses.     Heart sounds: Normal heart sounds.  Pulmonary:     Effort: Pulmonary effort is normal. No respiratory distress.     Breath sounds: Normal breath sounds.  Abdominal:     General: There is distension.     Comments: Distended, no fluid wave.  No rebound or guarding.  Musculoskeletal:  General: Normal range of motion.     Cervical back: Normal range of motion.  Skin:    General: Skin is warm and dry.     Capillary Refill: Capillary refill takes less than 2 seconds.  Neurological:     General: No focal deficit present.     Mental Status: She is alert and oriented to person, place, and time.     Comments: CN 2-12 grossly intact Ambulatory without ataxic gait Equal strength and sensation  Psychiatric:        Mood and Affect: Mood normal.    ED Results / Procedures / Treatments   Labs (all labs ordered are listed, but only abnormal results are displayed) Labs Reviewed  CBC WITH DIFFERENTIAL/PLATELET  COMPREHENSIVE METABOLIC PANEL  LIPASE, BLOOD  URINALYSIS, ROUTINE W REFLEX MICROSCOPIC  I-STAT BETA HCG BLOOD, ED ([facility], [facility], [facility] ONLY)    EKG None  Radiology CT Abdomen Pelvis W Contrast  Result Date: 08/31/2020 CLINICAL DATA:  Abdominal pain and known history of uterine fibroids. EXAM: CT ABDOMEN AND PELVIS WITH CONTRAST TECHNIQUE: Multidetector CT imaging of the abdomen and pelvis was performed using the standard protocol following bolus administration of intravenous contrast. CONTRAST:  137m OMNIPAQUE IOHEXOL 350 MG/ML SOLN COMPARISON:  None. FINDINGS: Lower chest: Scarring in the medial aspect of the right lower lobe and at the right lung base. Hepatobiliary: No focal liver abnormality is seen. No gallstones, gallbladder wall thickening, or biliary dilatation. Pancreas: Unremarkable. No pancreatic ductal dilatation or surrounding inflammatory changes. Spleen: Normal in size without focal abnormality. Adrenals/Urinary  Tract: Adrenal glands are unremarkable. Kidneys are normal, without renal calculi, focal lesion, or hydronephrosis. Bladder is unremarkable. Stomach/Bowel: Bowel shows no evidence of obstruction, ileus, inflammation or lesion. No free intraperitoneal air identified. Vascular/Lymphatic: No significant vascular findings are present. No enlarged abdominal or pelvic lymph nodes. Reproductive: The uterus is massively enlarged and measures approximately 20.4 x 15.9 x 27 cm. Estimated uterine volume is 4,554 mL. Dominant central fundal fibroid occupies much of the enlarged uterus and measures up to approximately 17.5 cm in estimated maximal diameter. There are multiple other intramural and subserosal fibroids present. The ovaries are not well visualized and likely compressed by the enlarged uterus. Other: No abdominal wall hernia or abnormality. No abdominopelvic ascites. Musculoskeletal: No acute or significant osseous findings. IMPRESSION: Massive uterine enlargement with estimated uterine volume of approximately 4500 mL and multiple uterine fibroids with a dominant partially degenerated central fundal fibroid measuring nearly 18 cm in estimated maximal diameter. No evidence of free fluid or hemorrhage in the pelvis or peritoneal cavity. Electronically Signed   By: GAletta EdouardM.D.   On: 08/31/2020 11:20    Procedures Procedures   Medications Ordered in ED Medications  sodium chloride 0.9 % bolus 1,000 mL (0 mLs Intravenous Stopped 08/31/20 1212)  iohexol (OMNIPAQUE) 350 MG/ML injection 100 mL (100 mLs Intravenous Contrast Given 08/31/20 1014)    ED Course  I have reviewed the triage vital signs and the nursing notes.  Pertinent labs & imaging results that were available during my care of the patient were reviewed by me and considered in my medical decision making (see chart for details).  Here for evaluation of abd distension. Afebrile, nonseptic, non-ill-appearing.  No associated pain.  No urinary  complaints.  Patient feels this is likely related to her newly diagnosed fibroids, followed by GYN for this.  No diarrhea or constipation.  Passing flatus. Does have distended abd.  Initial triage notes she checked in for  dizziness.  Patient states this is been an ongoing issue.  Typically only occurs around her menstrual cycles.  She denies any current dizziness and states this has not occurred "a long time."  She is not currently taking her birth control or her iron which she is prescribed by PCP.  She has no headache, current lightheadedness or dizziness.  She has a nonfocal neuro exam without deficits. Will have her FU with PCP for this as she has not current complaints and appears otherwise well in this regard.  Labs and imaging personally reviewed and interpreted:  Preg negative CBC without leukocytosis CMP without acute abnormality Lipase 38 UA>> did not collect, flushed. Doesn't not want to recollect CT AP with large fibroid uterus. No blood. Unable to visualized ovaries likely secondary to fibroid... Similar to [place] 2 months ago  Patient reassessed.  Continues to deny any pain.  Tolerating p.o. intake.  Discussed her labs and imaging.  Patient states now that she was only here to see if her fibroid uterus had enlarged.  I discussed with patient findings.  We will have her follow-up with OB/GYN whom she follows with this previously.  Patient is nontoxic, nonseptic appearing, in no apparent distress.  Patient's pain and other symptoms adequately managed in emergency department.  Fluid bolus given.  Labs, imaging and vitals reviewed.  Patient does not meet the SIRS or Sepsis criteria.  On repeat exam patient does not have a surgical abdomin and there are no peritoneal signs.  No indication of appendicitis, bowel obstruction, bowel perforation, cholecystitis, diverticulitis, PID, torsion, TOA or ectopic pregnancy.    The patient has been appropriately medically screened and/or stabilized in the ED.  I have low suspicion for any other emergent medical condition which would require further screening, evaluation or treatment in the ED or require inpatient management.  Patient is hemodynamically stable and in no acute distress.  Patient able to ambulate in department prior to ED.  Evaluation does not show acute pathology that would require ongoing or additional emergent interventions while in the emergency department or further inpatient treatment.  I have discussed the diagnosis with the patient and answered all questions.  Pain is been managed while in the emergency department and patient has no further complaints prior to discharge.  Patient is comfortable with plan discussed in room and is stable for discharge at this time.  I have discussed strict return precautions for returning to the emergency department.  Patient was encouraged to follow-up with PCP/specialist refer to at discharge.      MDM Rules/Calculators/A&P                            Final Clinical Impression(s) / ED Diagnoses Final diagnoses:  Uterine leiomyoma, unspecified location    Rx / DC Orders ED Discharge Orders     None        Lorrin Bodner A, PA-C 08/31/20 1220    Truddie Hidden, MD 08/31/20 1506

## 2020-08-31 NOTE — Discharge Instructions (Addendum)
Your OB/GYN to let them know of the repeat scan you had today which showed a fibroid uterus.  I would start taking your iron as well as your birth control as prescribed by your OB/GYN  Return for new or worsening symptoms.

## 2020-09-01 NOTE — Telephone Encounter (Signed)
Thanks

## 2020-09-05 ENCOUNTER — Encounter: Payer: Self-pay | Admitting: Emergency Medicine

## 2020-09-05 ENCOUNTER — Other Ambulatory Visit: Payer: Self-pay

## 2020-09-05 ENCOUNTER — Ambulatory Visit: Payer: Medicare Other | Admitting: Emergency Medicine

## 2020-09-05 ENCOUNTER — Encounter: Payer: Medicare Other | Admitting: Emergency Medicine

## 2020-09-05 VITALS — BP 138/80 | HR 70 | Temp 98.4°F | Resp 14 | Wt 169.0 lb

## 2020-09-05 DIAGNOSIS — I10 Essential (primary) hypertension: Secondary | ICD-10-CM

## 2020-09-05 DIAGNOSIS — D219 Benign neoplasm of connective and other soft tissue, unspecified: Secondary | ICD-10-CM | POA: Diagnosis not present

## 2020-09-05 DIAGNOSIS — Z1211 Encounter for screening for malignant neoplasm of colon: Secondary | ICD-10-CM

## 2020-09-05 DIAGNOSIS — F3176 Bipolar disorder, in full remission, most recent episode depressed: Secondary | ICD-10-CM

## 2020-09-05 NOTE — Patient Instructions (Signed)
Uterine Fibroids  Uterine fibroids are lumps of tissue (tumors) in the womb (uterus). Fibroids are not cancerous. Most women with this condition do not needtreatment. Sometimes, fibroids can make it harder to have children. If this happens, youmay need surgery to take out the fibroids. What are the causes? The cause of this condition is not known. What increases the risk? You are in your 30s or 40s and have not gone through menopause. Menopause is when you have not had a menstrual period for 12 months. Having a history of fibroids in your family. You are of African American descent. You started your period at age 30 or younger. You have not given birth. You are overweight or very overweight. What are the signs or symptoms? Bleeding between menstrual periods. Heavy bleeding during your menstrual period. Pain in the area between your hips. Needing to pee (urinate) right away or more often than usual. Not being able to have children (infertility). Not being able to stay pregnant (miscarriage). Many women do not have symptoms.  How is this treated? Treatment may include: Follow-up visits with your doctor to check your fibroids for any changes. Medicines to help with pain, such as aspirin or ibuprofen. Hormone therapy. This may be given as a pill, in a shot, or with a type of birth control device called an IUD. Surgery that would do one of these things: Take out the fibroids. This may be done if you want to become pregnant. Take out the womb (hysterectomy). Stop the blood flow to the fibroids. Follow these instructions at home: Medicines Take over-the-counter and prescription medicines only as told by your doctor. Ask your doctor if you should: Take iron pills. Eat more foods that have a lot of iron in them, such as dark green, leafy vegetables. Managing pain If told, put heat on your back or belly. Do this as often as told by your doctor. Use the heat source that your doctor  recommends, such as a moist heat pack or a heating pad. To do this: Put a towel between your skin and the heat pack or pad. Leave the heat on for 20-30 minutes. Take off the heat if your skin turns bright red. This is very important. If you cannot feel pain, heat, or cold, you may have a greater risk of getting burned.  General instructions Tell your doctor about any changes to your menstrual period, such as: Heavy bleeding that needs a change of tampons or pads more than normal. A change in how many days your period lasts. A change in symptoms that come with your period. This might be belly cramps or back pain. Keep all follow-up visits. Contact a doctor if: You have pain that does not get better with medicine or heat. This may include pain or cramps in: The area between your hip bones. Your back. Your belly. You have new bleeding between your periods. You have more bleeding during or between your periods. You feel very tired or weak. You feel dizzy. Get help right away if: You faint. You have pain in the area between your hip bones that gets worse. You have bleeding that soaks a tampon or pad in 30 minutes or less. Summary Uterine fibroids are lumps of tissue (tumors) in your womb. They are not cancerous. Medicines such as aspirin or ibuprofen may be used to help with pain. Contact a doctor if you have pain or cramps that do not get better with medicine. Know the symptoms for when you should get  help right away. This information is not intended to replace advice given to you by your health care provider. Make sure you discuss any questions you have with your healthcare provider. Document Revised: 08/02/2019 Document Reviewed: 08/02/2019 Elsevier Patient Education  [place].

## 2020-09-05 NOTE — Progress Notes (Signed)
Kristina Johnston 50 y.o.   Chief complaint: Emergency department visit follow-up  HISTORY OF PRESENT ILLNESS: This is a 50 y.o. female here for follow-up of emergency department visit on 08/31/2020 when she presented with abdominal distention. Patient has a history of large uterine fibroids.  Has seen gynecologist earlier this year.  Today she is doing better.  Denies abdominal pain, nausea or vomiting, fever or chills, or excessive vaginal bleeding. Assessment and plan from recent ED visit as follows: ED Course  I have reviewed the triage vital signs and the nursing notes.   Pertinent labs & imaging results that were available during my care of the patient were reviewed by me and considered in my medical decision making (see chart for details).   Here for evaluation of abd distension. Afebrile, nonseptic, non-ill-appearing.  No associated pain.  No urinary complaints.  Patient feels this is likely related to her newly diagnosed fibroids, followed by GYN for this.  No diarrhea or constipation.  Passing flatus. Does have distended abd.  Initial triage notes she checked in for dizziness.  Patient states this is been an ongoing issue.  Typically only occurs around her menstrual cycles.  She denies any current dizziness and states this has not occurred "a long time."  She is not currently taking her birth control or her iron which she is prescribed by PCP.  She has no headache, current lightheadedness or dizziness.  She has a nonfocal neuro exam without deficits. Will have her FU with PCP for this as she has not current complaints and appears otherwise well in this regard.   Labs and imaging personally reviewed and interpreted:   Preg negative CBC without leukocytosis CMP without acute abnormality Lipase 38 UA>> did not collect, flushed. Doesn't not want to recollect CT AP with large fibroid uterus. No blood. Unable to visualized ovaries likely secondary to fibroid... Similar to [place] 2 months ago    Patient reassessed.  Continues to deny any pain.  Tolerating p.o. intake.  Discussed her labs and imaging.  Patient states now that she was only here to see if her fibroid uterus had enlarged.  I discussed with patient findings.  We will have her follow-up with OB/GYN whom she follows with this previously.   Patient is nontoxic, nonseptic appearing, in no apparent distress.  Patient's pain and other symptoms adequately managed in emergency department.  Fluid bolus given.  Labs, imaging and vitals reviewed.  Patient does not meet the SIRS or Sepsis criteria.  On repeat exam patient does not have a surgical abdomin and there are no peritoneal signs.  No indication of appendicitis, bowel obstruction, bowel perforation, cholecystitis, diverticulitis, PID, torsion, TOA or ectopic pregnancy.     The patient has been appropriately medically screened and/or stabilized in the ED. I have low suspicion for any other emergent medical condition which would require further screening, evaluation or treatment in the ED or require inpatient management.   Patient is hemodynamically stable and in no acute distress.  Patient able to ambulate in department prior to ED.  Evaluation does not show acute pathology that would require ongoing or additional emergent interventions while in the emergency department or further inpatient treatment.  I have discussed the diagnosis with the patient and answered all questions.  Pain is been managed while in the emergency department and patient has no further complaints prior to discharge.  Patient is comfortable with plan discussed in room and is stable for discharge at this time.  I have discussed  strict return precautions for returning to the emergency department.  Patient was encouraged to follow-up with PCP/specialist refer to at discharge.      HPI   Prior to Admission medications   Medication Sig Start Date End Date Taking? Authorizing Provider  amLODipine (NORVASC) 10 MG tablet  Take 1 tablet (10 mg total) by mouth daily. 07/05/20   Horald Pollen, MD  Ascorbic Acid (VITAMIN C PO) Take by mouth daily.    [provider]  calcium-vitamin D (OSCAL WITH D) 250-125 MG-UNIT tablet Take 1 tablet by mouth daily.    [provider]  Cholecalciferol (VITAMIN D3 PO) Take by mouth daily.    [provider]  Collagen-Vitamin C-Biotin (COLLAGEN 1500/C PO) Take by mouth.    [provider]  ferrous sulfate 220 (44 Fe) MG/5ML solution Take 5 mLs (220 mg total) by mouth 2 (two) times daily. 07/16/20   Horald Pollen, MD  Ginger, Zingiber officinalis, (GINGER ROOT) 250 MG CAPS Take by mouth.    [provider]  haloperidol (HALDOL) 2 MG/ML solution Take 2 mg by mouth daily.  08/25/17   [provider]  loratadine (CLARITIN) 5 MG/5ML syrup Take by mouth daily.    [provider]  magnesium 30 MG tablet Take 30 mg by mouth 2 (two) times daily.    [provider]  Multiple Vitamins-Minerals (ADULT ONE DAILY GUMMIES PO) Take 2 tablets by mouth daily.    [provider]  Multiple Vitamins-Minerals (ZINC PO) Take by mouth daily.    [provider]  norethindrone (MICRONOR) 0.35 MG tablet Take 1 tablet (0.35 mg total) by mouth daily. 07/04/20   [name], MD  Omega-3 Fatty Acids (FISH OIL PO) Take by mouth daily.    [provider]  Potassium (POTASSIMIN PO) Take by mouth daily.    [provider]  triamcinolone (KENALOG) 0.1 % APPLY TO SKIN TWICE A DAY FOR NO MORE THAN 2 WEEKS 11/29/19   Horald Pollen, MD  Valproate Sodium (DEPAKENE) 250 MG/5ML SOLN solution Take 2 mLs by mouth 2 (two) times daily.     [provider]  vitamin B-12 (CYANOCOBALAMIN) 250 MCG tablet Take 250 mcg by mouth daily.    [provider]  vitamin k 100 MCG tablet Take 100 mcg by mouth daily.    [provider]    No Known Allergies  Patient Active Problem List    Diagnosis Date Noted   Fibroid tumor 04/23/2020   Essential hypertension 06/06/2017   Bipolar disorder ([place]) 04/08/2017   Depression 03/17/2011    Past Medical History:  Diagnosis Date   Anxiety    Depression    Paranoid schizophrenia ([place])     No past surgical history on file.  Social History   Socioeconomic History   Marital status: Divorced    Spouse name: Not on file   Number of children: Not on file   Years of education: Not on file   Highest education level: Not on file  Occupational History   Not on file  Tobacco Use   Smoking status: Never   Smokeless tobacco: Never  Vaping Use   Vaping Use: Never used  Substance and Sexual Activity   Alcohol use: No   Drug use: No   Sexual activity: Not Currently    Partners: Male    Birth control/protection: Patch    Comment: 1st intercourse-19, partners- 63, divorced  Other Topics Concern   Not on file  Social History Narrative   Not on file   Social Determinants of Health   Financial Resource Strain: Not on file  Food Insecurity: Not on file  Transportation Needs: Not on file  Physical Activity: Not on file  Stress: Not on file  Social Connections: Not on file  Intimate Partner Violence: Not on file    Family History  Problem Relation Age of Onset   Heart disease Mother    Hyperlipidemia Mother    Mental illness Mother    Diabetes Father    Hypertension Sister    Hyperlipidemia Brother    Hypertension Brother    Cancer Maternal Grandmother    Cancer Maternal Grandfather    Hyperlipidemia Maternal Grandfather    Hypertension Maternal Grandfather    Breast cancer Maternal Grandfather    Cancer Paternal Grandmother 81       breast cancer   Hyperlipidemia Paternal Grandfather    Hypertension Paternal Grandfather    Cancer Maternal Aunt 78       breast cancer   Breast cancer Maternal Aunt    Breast cancer Cousin      Review of Systems  Constitutional: Negative.  Negative for chills and fever.   HENT: Negative.  Negative for congestion and sore throat.   Respiratory: Negative.  Negative for cough and shortness of breath.   Cardiovascular: Negative.  Negative for chest pain and palpitations.  Gastrointestinal:  Negative for abdominal pain, diarrhea, nausea and vomiting.  Genitourinary: Negative.  Negative for dysuria and hematuria.  Skin: Negative.  Negative for rash.  Neurological: Negative.  Negative for dizziness and headaches.  All other systems reviewed and are negative.   Physical Exam Vitals reviewed.  Constitutional:      Appearance: Normal appearance.  HENT:     Head: Normocephalic.  Eyes:     Extraocular Movements: Extraocular movements intact.     Pupils: Pupils are equal, round, and reactive to light.  Cardiovascular:     Rate and Rhythm: Normal rate and regular rhythm.     Pulses: Normal pulses.     Heart sounds: Normal heart sounds.  Pulmonary:     Effort: Pulmonary effort is normal.     Breath sounds: Normal breath sounds.  Abdominal:     Tenderness: There is no abdominal tenderness.  Musculoskeletal:     Cervical back: Normal range of motion and neck supple.  Skin:    General: Skin is warm and dry.     Capillary Refill: Capillary refill takes less than 2 seconds.  Neurological:     General: No focal deficit present.     Mental Status: She is alert and oriented to person, place, and time.  Psychiatric:        Mood and Affect: Mood normal.        Behavior: Behavior normal.     ASSESSMENT & PLAN: Fibroid tumor Large fibroid tumor which will require surgery most likely.  Needs to follow-up with gynecologist in the next several weeks.  CT scan of abdomen and pelvis done in the emergency department reviewed with patient.  Blood work done within normal limits.  Asymptomatic at present time.  Pain not an issue at present time.  Essential hypertension Well-controlled hypertension.  Continue amlodipine 10 mg daily.  Diagnoses and all orders for this  visit:  Fibroid tumor  Essential hypertension  Bipolar disorder, in full remission, most recent episode depressed [facility])  Colon cancer screening -     Ambulatory referral to Gastroenterology  Patient Instructions  Uterine Fibroids  Uterine fibroids are lumps of tissue (tumors) in the womb (uterus). Fibroids are not cancerous. Most women with this condition do not needtreatment. Sometimes, fibroids can make it harder to have children. If this happens, youmay need surgery to take out the fibroids. What are the causes? The cause of this condition is not known. What increases the risk? You are in your 30s or 40s and have not gone through menopause. Menopause is when you have not had a menstrual period for 12 months. Having a history of fibroids in your family. You are of African American descent. You started your period at age 68 or younger. You have not given birth. You are overweight or very overweight. What are the signs or symptoms? Bleeding between menstrual periods. Heavy bleeding during your menstrual period. Pain in the area between your hips. Needing to pee (urinate) right away or more often than usual. Not being able to have children (infertility). Not being able to stay pregnant (miscarriage). Many women do not have symptoms.  How is this treated? Treatment may include: Follow-up visits with your doctor to check your fibroids for any changes. Medicines to help with pain, such as aspirin or ibuprofen. Hormone therapy. This may be given as a pill, in a shot, or with a type of birth control device called an IUD. Surgery that would do one of these things: Take out the fibroids. This may be done if you want to become pregnant. Take out the womb (hysterectomy). Stop the blood flow to the fibroids. Follow these instructions at home: Medicines Take over-the-counter and prescription medicines only as told by your doctor. Ask your doctor if you should: Take iron  pills. Eat more foods that have a lot of iron in them, such as dark green, leafy vegetables. Managing pain If told, put heat on your back or belly. Do this as often as told by your doctor. Use the heat source that your doctor recommends, such as a moist heat pack or a heating pad. To do this: Put a towel between your skin and the heat pack or pad. Leave the heat on for 20-30 minutes. Take off the heat if your skin turns bright red. This is very important. If you cannot feel pain, heat, or cold, you may have a greater risk of getting burned.  General instructions Tell your doctor about any changes to your menstrual period, such as: Heavy bleeding that needs a change of tampons or pads more than normal. A change in how many days your period lasts. A change in symptoms that come with your period. This might be belly cramps or back pain. Keep all follow-up visits. Contact a doctor if: You have pain that does not get better with medicine or heat. This may include pain or cramps in: The area between your hip bones. Your back. Your belly. You have new bleeding between your periods. You have more bleeding during or between your periods. You feel very tired or weak. You feel dizzy. Get help right away if: You faint. You have pain in the area between your hip bones that gets worse. You have bleeding that soaks a tampon or pad in 30 minutes or less. Summary Uterine fibroids are lumps of tissue (tumors) in your womb. They are not cancerous. Medicines such as aspirin or ibuprofen may be used to help with pain. Contact a doctor if you have pain or cramps that do not get better with medicine. Know the symptoms for when  you should get help right away. This information is not intended to replace advice given to you by your health care provider. Make sure you discuss any questions you have with your healthcare provider. Document Revised: 08/02/2019 Document Reviewed: 08/02/2019 Elsevier Patient  Education  2022 [place], MD [facility]

## 2020-09-05 NOTE — Assessment & Plan Note (Signed)
Large fibroid tumor which will require surgery most likely.  Needs to follow-up with gynecologist in the next several weeks.  CT scan of abdomen and pelvis done in the emergency department reviewed with patient.  Blood work done within normal limits.  Asymptomatic at present time.  Pain not an issue at present time.

## 2020-09-05 NOTE — Assessment & Plan Note (Signed)
Well-controlled hypertension.  Continue amlodipine 10 mg daily.

## 2020-09-06 ENCOUNTER — Ambulatory Visit: Payer: Medicare Other | Admitting: Emergency Medicine

## 2020-09-10 ENCOUNTER — Telehealth: Payer: Self-pay | Admitting: Emergency Medicine

## 2020-09-10 NOTE — Telephone Encounter (Signed)
Patient says she signed authorization form on 08.24.22 in office   Would like copy of form mailed to mailing address on file  Please followup w/ patient [phone]

## 2020-09-12 NOTE — Telephone Encounter (Signed)
Mailed a copy to pt, 09/11/20.

## 2020-09-18 ENCOUNTER — Other Ambulatory Visit: Payer: Self-pay | Admitting: Emergency Medicine

## 2020-09-18 DIAGNOSIS — Z1231 Encounter for screening mammogram for malignant neoplasm of breast: Secondary | ICD-10-CM

## 2020-09-24 NOTE — Telephone Encounter (Signed)
Patient says she never received mailed authorization form (see prev message below)  Requesting that another form be mailed out to address on file  Asked patient if she would like to pick up in office instead.. patient declined & requested it be mailed again

## 2020-09-25 NOTE — Telephone Encounter (Signed)
Called pt and left a vm that authorization form was mailed to patient.

## 2020-10-18 NOTE — Telephone Encounter (Signed)
Patient called in upset bc lab order results from 04/2020 had not been been sent to her psychiatrist as requested   Patient says [employer] form was received in mail but that form was giving permission to have those results sent  Patient requested to speak w/ supervisor.Marland Kitchen advised someone would call her back  [phone] or [phone]

## 2020-10-23 ENCOUNTER — Ambulatory Visit: Payer: Medicare Other | Admitting: Emergency Medicine

## 2020-10-24 ENCOUNTER — Ambulatory Visit: Payer: Medicare Other

## 2020-12-13 ENCOUNTER — Ambulatory Visit: Payer: Medicare Other | Admitting: Obstetrics & Gynecology

## 2020-12-17 ENCOUNTER — Telehealth: Payer: Self-pay

## 2020-12-17 NOTE — Telephone Encounter (Signed)
Patient states she has 13.2 cm fibroid. She is returning on 12/27/20 for follow up.  Her question for Dr. Marguerita Merles was if she needed a hysterectomy if it could be done vaginally with a fibroid that big.

## 2020-12-19 NOTE — Telephone Encounter (Signed)
Dr. Marguerita Merles replied in routing comment "Will have to examine/Pelvic US to decide on approach.  Not straight vaginal surgery, but maybe Robotic Total Laparoscopic Hysterectomy."  I spoke with patient and informed her.

## 2020-12-27 ENCOUNTER — Telehealth: Payer: Self-pay

## 2020-12-27 ENCOUNTER — Encounter: Payer: Self-pay | Admitting: Obstetrics & Gynecology

## 2020-12-27 ENCOUNTER — Ambulatory Visit (INDEPENDENT_AMBULATORY_CARE_PROVIDER_SITE_OTHER): Payer: Medicare Other | Admitting: Obstetrics & Gynecology

## 2020-12-27 ENCOUNTER — Ambulatory Visit (INDEPENDENT_AMBULATORY_CARE_PROVIDER_SITE_OTHER): Payer: Medicare Other

## 2020-12-27 ENCOUNTER — Other Ambulatory Visit: Payer: Self-pay | Admitting: Obstetrics & Gynecology

## 2020-12-27 ENCOUNTER — Other Ambulatory Visit: Payer: Self-pay

## 2020-12-27 ENCOUNTER — Telehealth: Payer: Self-pay | Admitting: Emergency Medicine

## 2020-12-27 VITALS — BP 114/78

## 2020-12-27 DIAGNOSIS — D219 Benign neoplasm of connective and other soft tissue, unspecified: Secondary | ICD-10-CM

## 2020-12-27 DIAGNOSIS — D259 Leiomyoma of uterus, unspecified: Secondary | ICD-10-CM

## 2020-12-27 DIAGNOSIS — Z3041 Encounter for surveillance of contraceptive pills: Secondary | ICD-10-CM

## 2020-12-27 DIAGNOSIS — R42 Dizziness and giddiness: Secondary | ICD-10-CM

## 2020-12-27 MED ORDER — FERROUS SULFATE 220 (44 FE) MG/5ML PO ELIX: 220.0000 mg | ORAL_SOLUTION | Freq: Two times a day (BID) | ORAL | 3 refills | Status: DC

## 2020-12-27 NOTE — Telephone Encounter (Signed)
[employer] Lupron order form faxed [facility] and confirmation received.  The Hard Copy is in the red folder in the rack on my desk.

## 2020-12-27 NOTE — Telephone Encounter (Signed)
[name], MD  [facility] Triage  Very large symptomatic Uterine  Fibroids.  Decision to proceed with Hysterectomy.  Will attempt bulk reduction of Fibroids with Depo-Lupron 3 month injections x 2.  Will then f/u in 06/2021 for a repeat Pelvic US and Preop to decide on the Surgical Approach for the Hysterectomy, either TAH/Bilateral Salpingectomy or XI Robotic TLH/BS.

## 2020-12-27 NOTE — Progress Notes (Signed)
° ° °  Kristina Johnston May 31, 1970 [id number]        50 y.o.  G1P1L1   RP: F/U symptomatic Uterine Fibroids with Pelvic US   HPI: Large Uterine Fibroids with abdominopelvic pressure.  Occasional dizzy spells.  No heavy menses on the Progestin pill.   OB History  Gravida Para Term Preterm AB Living  1 1       1   SAB IAB Ectopic Multiple Live Births               # Outcome Date GA Lbr Len/2nd Weight Sex Delivery Anes PTL Lv  1 Para             Past medical history,surgical history, problem list, medications, allergies, family history and social history were all reviewed and documented in the EPIC chart.   Directed ROS with pertinent positives and negatives documented in the history of present illness/assessment and plan.  Exam:  Vitals:   12/27/20 1122  BP: 114/78   General appearance:  Normal  Pelvic US today: Comparison is made with previous scan in July 04, 2020.  Transabdominal scan.  Grossly enlarged uterus with no significant change in size of the uterus or the largest fibroid since previous scan.  Several other smaller fibroids are included in measurements today.  The overall uterine size is measured at 24.32 x 16.48 x 15.42 cm.  The endometrial lining is distorted by surrounding fibroids and is measured at 3.1 mm.  No obvious thickening or mass seen at the endometrium.  Both ovaries are identified today.  The right ovary presents a small follicle, the left ovary has 2 small follicles 2 cm and 1 cm.  No free fluid in the pelvis.   Assessment/Plan:  50 y.o. G1P1   1. Fibroids Very large uterine Fibroids.  Decision to proceed with Hysterectomy.  Will attempt preop bulk reduction of Fibroids with Depo-Lupron x 6 months.  Usage, risks/benefits of DepoLupron reviewed.  Approaches for Hysterectomy discussed depending on overall uterine size.  Will reassess by Pelvic US after DepoLupron treatment. - US Transvaginal Non-OB; Future  2. Encounter for surveillance of contraceptive  pills Continue on the Progestin Pill.    3. Dizzy spells Possibly related to large Uterine Fibroids.  Recommend improving hydration.  Investigate through Fam MD.  Other orders - lithium carbonate (LITHOBID) 300 MG CR tablet; Take 600 mg by mouth at bedtime. - Multiple Vitamin (MULTIVITAMIN PO); Take by mouth.   [name] MD, 11:34 AM 12/27/2020

## 2020-12-27 NOTE — Telephone Encounter (Signed)
1.Medication Requested: ferrous sulfate 220 (44 Fe) MG/5ML solution  2. Pharmacy (Name, [facility]): [facility] [place], [address] AT [address]  3. On Med List: yes  4. Last Visit with PCP: 09-05-20  5. Next visit date with PCP: 04-02-2021  Patient requesting a 30 day supply

## 2020-12-30 ENCOUNTER — Encounter: Payer: Self-pay | Admitting: Obstetrics & Gynecology

## 2021-01-09 NOTE — Telephone Encounter (Signed)
PA required. I faxed the PA forms to [facility] yesterday.

## 2021-01-15 ENCOUNTER — Telehealth: Payer: Self-pay

## 2021-01-15 NOTE — Telephone Encounter (Signed)
I received copy of letter that medication approved by patient's insurance. I called and spoke with [facility] rep to see why I have not received call to arrange shipment.  The rep I talked with said the ship target date said 08/02/21. I informed her that on the request order I indicated ASAP for shipment. She changed that date to todays date and resent in through.  If I have not heard from them in a few days I will call again and follow up.

## 2021-01-15 NOTE — Telephone Encounter (Signed)
You had recommended Lupron 11.25 mg for this patient. Her insurance approved it. However, it is going to cost her $2000.  She cannot afford it and asked if you have any other ideas for shrinking the fibroids so that she can have Robotic Surgery.

## 2021-01-16 NOTE — Telephone Encounter (Signed)
I can send a new order for the Lupron 3.75 mg and let [employer] run the benefit and see. ° °However is appears the issue is that she has a $1500 deductible that applies first and 20% co-insurance.  No matter which dose she uses her deductible will have to be met initially. ° ° °

## 2021-01-16 NOTE — Telephone Encounter (Signed)
Kristina Bruins, MD  You 2 hours ago (8:45 AM)   Would the monthly dosage x 6 be less expensive?

## 2021-01-21 NOTE — Telephone Encounter (Signed)
New order for 3.75 mg sent in last week. Email received confirming they received it.

## 2021-01-21 NOTE — Telephone Encounter (Signed)
[name], MD  You 5 days ago   [facility], it may be worth it to make the approach possible with the Robot and lower the surgical risk.

## 2021-01-22 ENCOUNTER — Telehealth: Payer: Self-pay

## 2021-01-22 NOTE — Telephone Encounter (Signed)
Patient said since July she has off and on spotting on Norethindrone.  However, since 01/02/21 she has had off and on spotting and sometimes heavy bleeding. It goes back and forth between spotting and heavy bleeding.  (Still waiting for her specialty pharmacy/prior authorization approval and they will call her with cost for 3.75mg  Lupron but this is in process.)

## 2021-01-24 NOTE — Telephone Encounter (Signed)
Patient declined Rx with [facility] due to cost of $2000 due prior to shipment.

## 2021-01-30 ENCOUNTER — Other Ambulatory Visit: Payer: Self-pay

## 2021-01-30 NOTE — Telephone Encounter (Signed)
Kristina Bruins, MD  You 10 minutes ago (9:36 AM)   Until she gets DepoLupron approval, can stop the Progestin pill and start on Megace 40 mg PO BID instead

## 2021-01-30 NOTE — Telephone Encounter (Signed)
Patient called back. See telephone encounter 01/22/21. She cannot afford the Lupron.

## 2021-01-30 NOTE — Telephone Encounter (Signed)
I spoke with patient and recommended she stop the Norethindrone and start the Megace. Patient said it appears since yesterday the bleeding has stopped and she would like to just keep on taking Norethindrone in light of this. She is concerned changing the meds might cause her to bleed even more.  She will continue Norethindrone.  Also, Depo Lupron 3.75 mg is going to cost patient $1500 because that is her deductible and the med is $1600.  She cannot afford it at this time.  What to rec?

## 2021-01-30 NOTE — Telephone Encounter (Signed)
Kristina Bruins, MD  You 3 minutes ago (10:00 AM)   Lupron was to reduce the bulk of Fibroids prior to Hysterectomy.  We can proceed with Total Abdominal Hysterectomy with Bilateral Salpingectomy.

## 2021-01-30 NOTE — Telephone Encounter (Signed)
Spoke with patient and informed her. She really does not want to have abdominal surgery. She said she has never had any surgery and it frightens her. She opts to wait and see how much her income tax refund will be and see if she might afford a Depo Lupron 11.25 injection in hopes of shrinking the fibroid.  She will call when she is ready for Lupron or surgery.

## 2021-02-11 ENCOUNTER — Other Ambulatory Visit: Payer: Self-pay | Admitting: Obstetrics & Gynecology

## 2021-02-11 ENCOUNTER — Telehealth: Payer: Self-pay | Admitting: *Deleted

## 2021-02-11 MED ORDER — MEGESTROL ACETATE 40 MG PO TABS: 40.0000 mg | ORAL_TABLET | Freq: Two times a day (BID) | ORAL | 0 refills | Status: DC

## 2021-02-11 NOTE — Telephone Encounter (Signed)
Patient aware she can take both pills daily in am. She is aware to d/c the Micronor.

## 2021-02-11 NOTE — Telephone Encounter (Signed)
Dr.Lavoie replied "Yes, can take both Megace tablets in am. "   Left message for patient to call.

## 2021-02-11 NOTE — Telephone Encounter (Signed)
Patient called to update from previous telephone encounter 01/22/21 "Spoke with patient and informed her. She really does not want to have abdominal surgery. She said she has never had any surgery and it frightens her. She opts to wait and see how much her income tax refund will be and see if she might afford a Depo Lupron 11.25 injection in hopes of shrinking the fibroid.She will call when she is ready for Lupron or surgery."  Patient said you mentioned megace 40 mg tablet twice daily and stopping Micronor 0.35 mg tablet. Patient reports she has a hard time taking pills and taking the Micronor is a struggle. She asked if the megace could be taking once daily, I explained that you want her to take 40 mg bid for a total of 80 mg daily. She asked if 40 mg (both pills)  could be taken at once in am? Has been on Micronor since June 2022 and since December 2022 bleeding has been spotting to heavy flow. The past weekend she would have spotting to heavy bleeding changing a pad every 1-2 hours. Please advise

## 2021-02-12 NOTE — Telephone Encounter (Signed)
Patient called back because most of her life she has trouble taking pills.  She said she tried to take the Megace this morning and because of the size of the pill she gagged and vomited.  She is asking if there might be a liquid in this medication that you could prescribe?  Or is it okay if she chews it up?

## 2021-02-12 NOTE — Telephone Encounter (Signed)
Kristina Bruins, MD  You 51 minutes ago (3:20 PM)   Can chew or crunch it with something she likes.

## 2021-02-12 NOTE — Telephone Encounter (Signed)
Spoke with patient and informed her. She will crush the tablet.

## 2021-02-25 ENCOUNTER — Telehealth: Payer: Self-pay | Admitting: *Deleted

## 2021-02-25 NOTE — Telephone Encounter (Signed)
Call returned to patient.  Spoke with patient. Patient request to proceed with surgery end of June, reviewed dates, would like to plan on 07/09/21.  Advised I have sent request to Dr. Dellis Filbert, will f/u with recommendations once surgery request received.    Per review of Epic, OV 12/27/20 -hysterectomy discussed.  Patient is scheduled for PUS 06/20/21.   Dr. Dellis Filbert -please send surgery request.   Cc: Hayley Carder

## 2021-03-04 ENCOUNTER — Other Ambulatory Visit: Payer: Self-pay | Admitting: *Deleted

## 2021-03-04 NOTE — Telephone Encounter (Signed)
Patient called requesting refill on megace 40 mg tablet. Patient said bleeding is better, reports only having spotting now.     Per previous conversation with Sharee Pimple, RN on 02/25/21  Patient request to proceed with surgery end of June, reviewed dates, would like to plan on 07/09/21.  Advised I have sent request to Dr. Dellis Filbert, will f/u with recommendations once surgery request received.   Per review of Epic, OV 12/27/20 -hysterectomy discussed.  Patient is scheduled for PUS 06/20/21.

## 2021-03-06 MED ORDER — MEGESTROL ACETATE 40 MG PO TABS: 40.0000 mg | ORAL_TABLET | Freq: Two times a day (BID) | ORAL | 2 refills | Status: DC

## 2021-03-11 NOTE — Telephone Encounter (Signed)
Spoke with patient. Patient calling to f/u on surgery request. Patient states she os aware robot/laparoscopic surgery was not an option for hysterectomy. Asking if abdominal is her only option? Advised I have sent to Dr. Dellis Filbert to review and advise, awaiting response. Patient agreeable.   Dr. Dellis Filbert -please provide surgery information for scheduling.

## 2021-03-12 ENCOUNTER — Telehealth: Payer: Self-pay

## 2021-03-12 NOTE — Telephone Encounter (Signed)
I called patient because I received a fax PA form from [facility] for Lupron. She had informed me awhile back that she could not afford to proceed with Lupron. Patient said her plans have not changed and she has surgery tentatively scheduled. She does not want Lupron and I will fax this form back and inform [facility].

## 2021-03-18 NOTE — Telephone Encounter (Signed)
Kristina Bruins, MD  You 2 days ago  ? ?Yes, only option is an abdominal approach as her uterus with fibroids is very large.   ? ? ?Spoke with patient. Advised per Dr. Dellis Filbert.  ?Patient will proceed with f/u PUS on 06/20/21 as scheduled. I will f/u with her for surgery scheduling after PUS completed. Will tentatively plan for surgery on 07/09/21. Patient verbalizes understanding and is agreeable.  ? ?Routing to provider for final review. Patient is agreeable to disposition. Will close encounter. ? ?Cc: Hayley Carder ?

## 2021-04-02 ENCOUNTER — Encounter: Payer: Self-pay | Admitting: Emergency Medicine

## 2021-04-02 ENCOUNTER — Ambulatory Visit (INDEPENDENT_AMBULATORY_CARE_PROVIDER_SITE_OTHER): Payer: Medicare Other | Admitting: Emergency Medicine

## 2021-04-02 ENCOUNTER — Other Ambulatory Visit: Payer: Self-pay

## 2021-04-02 VITALS — BP 128/80 | HR 93 | Ht 65.0 in | Wt 187.0 lb

## 2021-04-02 DIAGNOSIS — Z130 Encounter for screening for diseases of the blood and blood-forming organs and certain disorders involving the immune mechanism: Secondary | ICD-10-CM

## 2021-04-02 DIAGNOSIS — Z1211 Encounter for screening for malignant neoplasm of colon: Secondary | ICD-10-CM | POA: Diagnosis not present

## 2021-04-02 DIAGNOSIS — D219 Benign neoplasm of connective and other soft tissue, unspecified: Secondary | ICD-10-CM | POA: Diagnosis not present

## 2021-04-02 DIAGNOSIS — Z1329 Encounter for screening for other suspected endocrine disorder: Secondary | ICD-10-CM

## 2021-04-02 DIAGNOSIS — I10 Essential (primary) hypertension: Secondary | ICD-10-CM

## 2021-04-02 DIAGNOSIS — Z13220 Encounter for screening for lipoid disorders: Secondary | ICD-10-CM

## 2021-04-02 DIAGNOSIS — Z1159 Encounter for screening for other viral diseases: Secondary | ICD-10-CM | POA: Diagnosis not present

## 2021-04-02 DIAGNOSIS — Z0001 Encounter for general adult medical examination with abnormal findings: Secondary | ICD-10-CM

## 2021-04-02 DIAGNOSIS — Z13228 Encounter for screening for other metabolic disorders: Secondary | ICD-10-CM | POA: Diagnosis not present

## 2021-04-02 LAB — CBC WITH DIFFERENTIAL/PLATELET
Basophils Absolute: 0 10*3/uL (ref 0.0–0.1)
Basophils Relative: 1 % (ref 0.0–3.0)
Eosinophils Absolute: 0.1 10*3/uL (ref 0.0–0.7)
Eosinophils Relative: 1.9 % (ref 0.0–5.0)
HCT: 41.9 % (ref 36.0–46.0)
Hemoglobin: 14.2 g/dL (ref 12.0–15.0)
Lymphocytes Relative: 36.3 % (ref 12.0–46.0)
Lymphs Abs: 1.5 10*3/uL (ref 0.7–4.0)
MCHC: 33.9 g/dL (ref 30.0–36.0)
MCV: 92.8 fl (ref 78.0–100.0)
Monocytes Absolute: 0.4 10*3/uL (ref 0.1–1.0)
Monocytes Relative: 10 % (ref 3.0–12.0)
Neutro Abs: 2.2 10*3/uL (ref 1.4–7.7)
Neutrophils Relative %: 50.8 % (ref 43.0–77.0)
Platelets: 231 10*3/uL (ref 150.0–400.0)
RBC: 4.51 Mil/uL (ref 3.87–5.11)
RDW: 13.5 % (ref 11.5–15.5)
WBC: 4.2 10*3/uL (ref 4.0–10.5)

## 2021-04-02 LAB — COMPREHENSIVE METABOLIC PANEL
ALT: 19 U/L (ref 0–35)
AST: 18 U/L (ref 0–37)
Albumin: 4.6 g/dL (ref 3.5–5.2)
Alkaline Phosphatase: 41 U/L (ref 39–117)
BUN: 11 mg/dL (ref 6–23)
CO2: 25 mEq/L (ref 19–32)
Calcium: 9.5 mg/dL (ref 8.4–10.5)
Chloride: 107 mEq/L (ref 96–112)
Creatinine, Ser: 0.83 mg/dL (ref 0.40–1.20)
GFR: 81.09 mL/min (ref >60.00)
Glucose, Bld: 75 mg/dL (ref 70–99)
Potassium: 4.1 mEq/L (ref 3.5–5.1)
Sodium: 141 mEq/L (ref 135–145)
Total Bilirubin: 0.5 mg/dL (ref 0.2–1.2)
Total Protein: 7.6 g/dL (ref 6.0–8.3)

## 2021-04-02 LAB — LIPID PANEL
Cholesterol: 148 mg/dL (ref 0–200)
HDL: 43.9 mg/dL (ref >39.00)
LDL Cholesterol: 86 mg/dL (ref 0–99)
NonHDL: 104.37
Total CHOL/HDL Ratio: 3
Triglycerides: 93 mg/dL (ref 0.0–149.0)
VLDL: 18.6 mg/dL (ref 0.0–40.0)

## 2021-04-02 MED ORDER — AMLODIPINE BESYLATE 10 MG PO TABS: 10.0000 mg | ORAL_TABLET | Freq: Every day | ORAL | 3 refills | Status: DC

## 2021-04-02 MED ORDER — FERROUS SULFATE 220 (44 FE) MG/5ML PO ELIX: 220.0000 mg | ORAL_SOLUTION | Freq: Two times a day (BID) | ORAL | 3 refills | Status: DC

## 2021-04-02 NOTE — Patient Instructions (Signed)

## 2021-04-02 NOTE — Progress Notes (Signed)
Margaux Engen ?51 y.o. ? ? ?Chief Complaint  ?Patient presents with  ? Annual Exam  ?  Med refill of Amodipine and ferous sulfate  ? ? ?HISTORY OF PRESENT ILLNESS: ?This is a 51 y.o. female here for annual exam. ?History of hypertension on amlodipine ?History of fibroid tumor, scheduled for surgery next June. ?No other complaints or medical concerns today. ? ?HPI ? ? ?Prior to Admission medications   ?Medication Sig Start Date End Date Taking? Authorizing Provider  ?amLODipine (NORVASC) 10 MG tablet Take 1 tablet (10 mg total) by mouth daily. 07/05/20  Yes Gizella Belleville, Ines Bloomer, MD  ?Ascorbic Acid (VITAMIN C PO) Take by mouth daily.   Yes [provider]  ?calcium-vitamin D (OSCAL WITH D) 250-125 MG-UNIT tablet Take 1 tablet by mouth daily.   Yes [provider]  ?Cholecalciferol (VITAMIN D3 PO) Take by mouth daily.   Yes [provider]  ?Collagen-Vitamin C-Biotin (COLLAGEN 1500/C PO) Take by mouth.   Yes [provider]  ?ferrous sulfate 220 (44 Fe) MG/5ML solution Take 5 mLs (220 mg total) by mouth 2 (two) times daily. 12/27/20  Yes Agapito Hanway, Ines Bloomer, MD  ?haloperidol (HALDOL) 2 MG/ML solution Take 2 mg by mouth daily.  08/25/17  Yes [provider]  ?lithium carbonate (LITHOBID) 300 MG CR tablet Take 600 mg by mouth at bedtime. 09/25/20  Yes [provider]  ?loratadine (CLARITIN) 5 MG/5ML syrup Take by mouth daily.   Yes [provider]  ?magnesium 30 MG tablet Take 30 mg by mouth 2 (two) times daily.   Yes [provider]  ?megestrol (MEGACE) 40 MG tablet Take 1 tablet (40 mg total) by mouth 2 (two) times daily. 03/06/21  Yes [name], MD  ?Multiple Vitamin (MULTIVITAMIN PO) Take by mouth.   Yes [provider]  ?Multiple Vitamins-Minerals (ZINC PO) Take by mouth daily.   Yes [provider]  ?Omega-3 Fatty Acids (FISH OIL PO) Take by mouth daily.   Yes [provider]  ?Potassium (POTASSIMIN PO) Take  by mouth daily.   Yes [provider]  ?triamcinolone (KENALOG) 0.1 % APPLY TO SKIN TWICE A DAY FOR NO MORE THAN 2 WEEKS 11/29/19  Yes Harutyun Monteverde, Ines Bloomer, MD  ?Valproate Sodium (DEPAKENE) 250 MG/5ML SOLN solution Take 2 mLs by mouth 2 (two) times daily.    Yes [provider]  ?vitamin B-12 (CYANOCOBALAMIN) 250 MCG tablet Take 250 mcg by mouth daily.   Yes [provider]  ?vitamin k 100 MCG tablet Take 100 mcg by mouth daily.   Yes [provider]  ? ? ?No Known Allergies ? ?Patient Active Problem List  ? Diagnosis Date Noted  ? Fibroid tumor 04/23/2020  ? Essential hypertension 06/06/2017  ? Bipolar disorder ([place]) 04/08/2017  ? Depression 03/17/2011  ? ? ?Past Medical History:  ?Diagnosis Date  ? Anxiety   ? Depression   ? Paranoid schizophrenia ([place])   ? ? ?No past surgical history on file. ? ?Social History  ? ?Socioeconomic History  ? Marital status: Divorced  ?  Spouse name: Not on file  ? Number of children: Not on file  ? Years of education: Not on file  ? Highest education level: Not on file  ?Occupational History  ? Not on file  ?Tobacco Use  ? Smoking status: Never  ? Smokeless tobacco: Never  ?Vaping Use  ? Vaping Use: Never used  ?Substance and Sexual Activity  ? Alcohol use: No  ? Drug  use: No  ? Sexual activity: Not Currently  ?  Partners: Male  ?  Birth control/protection: Pill  ?  Comment: 1st intercourse-19, partners- 51, divorced  ?Other Topics Concern  ? Not on file  ?Social History Narrative  ? Not on file  ? ?Social Determinants of Health  ? ?Financial Resource Strain: Not on file  ?Food Insecurity: Not on file  ?Transportation Needs: Not on file  ?Physical Activity: Not on file  ?Stress: Not on file  ?Social Connections: Not on file  ?Intimate Partner Violence: Not on file  ? ? ?Family History  ?Problem Relation Age of Onset  ? Heart disease Mother   ? Hyperlipidemia Mother   ? Mental illness Mother   ? Diabetes Father   ? Hypertension Sister   ?  Hyperlipidemia Brother   ? Hypertension Brother   ? Cancer Maternal Grandmother   ? Cancer Maternal Grandfather   ? Hyperlipidemia Maternal Grandfather   ? Hypertension Maternal Grandfather   ? Breast cancer Maternal Grandfather   ? Cancer Paternal Grandmother 21  ?     breast cancer  ? Hyperlipidemia Paternal Grandfather   ? Hypertension Paternal Grandfather   ? Cancer Maternal Aunt 63  ?     breast cancer  ? Breast cancer Maternal Aunt   ? Breast cancer Cousin   ? ? ? ?Review of Systems  ?Constitutional: Negative.  Negative for chills and fever.  ?HENT: Negative.  Negative for congestion and sore throat.   ?Respiratory: Negative.  Negative for cough and shortness of breath.   ?Cardiovascular: Negative.  Negative for chest pain and palpitations.  ?Gastrointestinal:  Negative for abdominal pain, diarrhea, nausea and vomiting.  ?Genitourinary: Negative.  Negative for dysuria and hematuria.  ?Skin: Negative.  Negative for rash.  ?Neurological: Negative.  Negative for dizziness and headaches.  ?All other systems reviewed and are negative. ? ?Today's Vitals  ? 04/02/21 0946  ?BP: 128/80  ?Pulse: 93  ?SpO2: 94%  ?Weight: 187 lb (84.8 kg)  ?Height: '5\' 5"'$  (1.651 m)  ? ?Body mass index is 31.12 kg/m?. ? ?Physical Exam ?Vitals reviewed.  ?Constitutional:   ?   Appearance: Normal appearance.  ?HENT:  ?   Head: Normocephalic.  ?   Right Ear: Tympanic membrane, ear canal and external ear normal.  ?   Left Ear: Tympanic membrane, ear canal and external ear normal.  ?   Mouth/Throat:  ?   Mouth: Mucous membranes are moist.  ?   Pharynx: Oropharynx is clear.  ?Eyes:  ?   Extraocular Movements: Extraocular movements intact.  ?   Pupils: Pupils are equal, round, and reactive to light.  ?Neck:  ?   Vascular: No carotid bruit.  ?Cardiovascular:  ?   Rate and Rhythm: Normal rate and regular rhythm.  ?   Pulses: Normal pulses.  ?   Heart sounds: Normal heart sounds.  ?Pulmonary:  ?   Effort: Pulmonary effort is normal.  ?   Breath  sounds: Normal breath sounds.  ?Abdominal:  ?   General: There is no distension.  ?   Palpations: Abdomen is soft. There is mass (Fibroid tumor).  ?   Tenderness: There is no abdominal tenderness.  ?Musculoskeletal:  ?   Cervical back: Neck supple. No tenderness.  ?Lymphadenopathy:  ?   Cervical: No cervical adenopathy.  ?Skin: ?   General: Skin is warm and dry.  ?   Capillary Refill: Capillary refill takes less than 2 seconds.  ?Neurological:  ?  General: No focal deficit present.  ?   Mental Status: She is alert and oriented to person, place, and time.  ?Psychiatric:     ?   Mood and Affect: Mood normal.     ?   Behavior: Behavior normal.  ? ? ? ?ASSESSMENT & PLAN: ?Problem List Items Addressed This Visit   ? ?  ? Cardiovascular and Mediastinum  ? Essential hypertension  ? Relevant Medications  ? amLODipine (NORVASC) 10 MG tablet  ? Other Relevant Orders  ? CBC with Differential  ? Comprehensive metabolic panel  ?  ? Other  ? Fibroid tumor  ? Relevant Orders  ? CBC with Differential  ? ?Other Visit Diagnoses   ? ? Encounter for general adult medical examination with abnormal findings    -  Primary  ? Need for hepatitis C screening test      ? Relevant Orders  ? Hepatitis C antibody screen  ? Colon cancer screening      ? Relevant Orders  ? Ambulatory referral to Gastroenterology  ? Screening for deficiency anemia      ? Relevant Orders  ? CBC with Differential  ? Screening for lipoid disorders      ? Relevant Orders  ? Lipid panel  ? Screening for endocrine, metabolic and immunity disorder      ? Relevant Orders  ? Comprehensive metabolic panel  ? ?  ? ?Modifiable risk factors discussed with patient. ?Anticipatory guidance according to age provided. ?The following topics were also discussed: ?Social Determinants of Health ?Smoking.  Non-smoker ?Diet and nutrition ?Benefits of exercise ?Cancer screening and need for mammogram and colonoscopy.  States she will get them done after surgery in June. ?Vaccinations  recommendations ?Cardiovascular risk assessment ?Mental health including depression and anxiety ?Fall and accident prevention ? ?Patient Instructions  ?Health Maintenance, Female ?Adopting a healthy lifestyle and gett

## 2021-04-03 LAB — HEPATITIS C ANTIBODY
Hepatitis C Ab: NONREACTIVE
SIGNAL TO CUT-OFF: 0.03 (ref <1.00)

## 2021-04-09 ENCOUNTER — Telehealth: Payer: Self-pay | Admitting: Emergency Medicine

## 2021-04-09 NOTE — Telephone Encounter (Signed)
Pt called back to give updated fax number.  ? ?Fax #- [phone] ?

## 2021-04-09 NOTE — Telephone Encounter (Signed)
Pt requesting a copy of recent lab reports faxed to her physiatrist Dr. Toy Care  ? ?Pt states she signed a medical release form "a couple of months ago"  giving permission to send medical information to Dr. Toy Care ? ?Pt states "she thinks the fax number is [phone]" ?

## 2021-04-15 NOTE — Telephone Encounter (Signed)
Pt requesting a medical records release form mailed to her address on file ? ?Pt states she made the request last week, I was unable to locate the request ?

## 2021-04-15 NOTE — Telephone Encounter (Signed)
Medical release form mail again to patient address on file  ?

## 2021-04-17 MED ORDER — FERROUS SULFATE 220 (44 FE) MG/5ML PO ELIX: 220.0000 mg | ORAL_SOLUTION | Freq: Two times a day (BID) | ORAL | 3 refills | Status: DC

## 2021-04-17 NOTE — Telephone Encounter (Signed)
Pt checking status of request, informed pt form was mailed again on 4-3 ? ?Pt inquiring if she can get a "bigger bottle" of ferrous sulfate 220 (44 Fe) MG/5ML solution ? ? ?Please advise  ?

## 2021-04-17 NOTE — Telephone Encounter (Signed)
Okay for new 30-day prescription.  Thanks.

## 2021-05-08 ENCOUNTER — Telehealth: Payer: Self-pay

## 2021-05-08 NOTE — Telephone Encounter (Signed)
Please call pt to schedule or reschedule AWV. Thanks  

## 2021-05-31 ENCOUNTER — Telehealth: Payer: Self-pay | Admitting: Emergency Medicine

## 2021-06-04 ENCOUNTER — Other Ambulatory Visit: Payer: Self-pay | Admitting: *Deleted

## 2021-06-04 NOTE — Telephone Encounter (Signed)
Patient called to follow up on surgery plan and request refill of Megace 40 mg tab.   Rx pended for Megace. Will route to Dr. Dellis Filbert to review request.   2. Patient requesting benefits for surgery. Patient is currently scheduled for PUS on 6/8 and tentatively surgery on 07/09/21. Advised surgical plan will be confirmed at PUS visit and can then schedule surgery. Advised benefits may not be estimated until surgery plan confirmed. Patient requesting return call from business office to discuss. Advised I will forward for return call. Patient appreciative.    Dr. Dellis Filbert -please review refill request.   Cc: Hayley and [facility]ex[facility].

## 2021-06-05 ENCOUNTER — Other Ambulatory Visit: Payer: Self-pay | Admitting: Obstetrics & Gynecology

## 2021-06-05 MED ORDER — MEGESTROL ACETATE 40 MG PO TABS: 40.0000 mg | ORAL_TABLET | Freq: Two times a day (BID) | ORAL | 1 refills | Status: DC

## 2021-06-05 NOTE — Telephone Encounter (Signed)
Spoke with patient regarding ultrasound benefits. Patient acknowledges understand of information presented.   Spoke with patient regarding surgery benefits. Patient acknowledges understanding of information presented. Patient is aware that benefits presented are professional benefits only. Patient is aware the hospital will call with facility benefits. See account note.  Routing to Glorianne Manchester, Therapist, sports.

## 2021-06-13 ENCOUNTER — Ambulatory Visit (INDEPENDENT_AMBULATORY_CARE_PROVIDER_SITE_OTHER): Payer: Medicare Other | Admitting: *Deleted

## 2021-06-13 DIAGNOSIS — Z0000 Encounter for general adult medical examination without abnormal findings: Secondary | ICD-10-CM

## 2021-06-13 NOTE — Progress Notes (Signed)
Subjective:   Kristina Johnston is a 51 y.o. female who presents for Medicare Annual (Subsequent) preventive examination.  I connected with  Genelle Bal on 06/13/21 by a telephone enabled telemedicine application and verified that I am speaking with the correct person using two identifiers.   I discussed the limitations of evaluation and management by telemedicine. The patient expressed understanding and agreed to proceed.  Patient location: home  Provider location:  Tele-Health-Home    Review of Systems     Cardiac Risk Factors include: advanced age (>63mn, >>18women);hypertension;obesity (BMI >30kg/m2);family history of premature cardiovascular disease     Objective:    Today's Vitals   There is no height or weight on file to calculate BMI.     06/13/2021    9:42 AM 08/31/2020    7:14 AM 02/11/2019    2:07 PM 09/12/2017    3:24 PM  Advanced Directives  Does Patient Have a Medical Advance Directive? Yes Yes Yes No  Type of Advance Directive Living [place]  Does patient want to make changes to medical advance directive?   Yes (Inpatient - patient defers changing a medical advance directive and declines information at this time)   Copy of [place] Chart?   No - copy requested   Would patient like information on creating a medical advance directive?    No - Patient declined    Current Medications (verified) Outpatient Encounter Medications as of 06/13/2021  Medication Sig   amLODipine (NORVASC) 10 MG tablet Take 1 tablet (10 mg total) by mouth daily.   Ascorbic Acid (VITAMIN C PO) Take by mouth daily.   calcium-vitamin D (OSCAL WITH D) 250-125 MG-UNIT tablet Take 1 tablet by mouth daily.   ferrous sulfate 220 (44 Fe) MG/5ML solution Take 5 mLs (220 mg total) by mouth 2 (two) times daily.   haloperidol (HALDOL) 2 MG/ML solution Take 2 mg by mouth daily.    lithium carbonate (LITHOBID)  300 MG CR tablet Take 600 mg by mouth at bedtime.   loratadine (CLARITIN) 5 MG/5ML syrup Take by mouth daily.   magnesium 30 MG tablet Take 30 mg by mouth 2 (two) times daily.   megestrol (MEGACE) 40 MG tablet Take 1 tablet (40 mg total) by mouth 2 (two) times daily.   Multiple Vitamin (MULTIVITAMIN PO) Take by mouth.   Multiple Vitamins-Minerals (ZINC PO) Take by mouth daily.   Omega-3 Fatty Acids (FISH OIL PO) Take by mouth daily.   Potassium (POTASSIMIN PO) Take by mouth daily.   triamcinolone (KENALOG) 0.1 % APPLY TO SKIN TWICE A DAY FOR NO MORE THAN 2 WEEKS   Valproate Sodium (DEPAKENE) 250 MG/5ML SOLN solution Take 2 mLs by mouth 2 (two) times daily.    vitamin B-12 (CYANOCOBALAMIN) 250 MCG tablet Take 250 mcg by mouth daily.   vitamin k 100 MCG tablet Take 100 mcg by mouth daily.   Cholecalciferol (VITAMIN D3 PO) Take by mouth daily. (Patient not taking: Reported on 06/13/2021)   Collagen-Vitamin C-Biotin (COLLAGEN 1500/C PO) Take by mouth.   No facility-administered encounter medications on file as of 06/13/2021.    Allergies (verified) Patient has no known allergies.   History: Past Medical History:  Diagnosis Date   Anxiety    Depression    Paranoid schizophrenia ([place]    No past surgical history on file. Family History  Problem Relation Age of Onset   Heart disease Mother  Hyperlipidemia Mother    Mental illness Mother    Diabetes Father    Hypertension Sister    Hyperlipidemia Brother    Hypertension Brother    Cancer Maternal Grandmother    Cancer Maternal Grandfather    Hyperlipidemia Maternal Grandfather    Hypertension Maternal Grandfather    Breast cancer Maternal Grandfather    Cancer Paternal Grandmother 64       breast cancer   Hyperlipidemia Paternal Grandfather    Hypertension Paternal Grandfather    Cancer Maternal Aunt 40       breast cancer   Breast cancer Maternal Aunt    Breast cancer Cousin    Social History   Socioeconomic History    Marital status: Divorced    Spouse name: Not on file   Number of children: Not on file   Years of education: Not on file   Highest education level: Not on file  Occupational History   Not on file  Tobacco Use   Smoking status: Never   Smokeless tobacco: Never  Vaping Use   Vaping Use: Never used  Substance and Sexual Activity   Alcohol use: No   Drug use: No   Sexual activity: Not Currently    Partners: Male    Birth control/protection: Pill    Comment: 1st intercourse-19, partners- 48, divorced  Other Topics Concern   Not on file  Social History Narrative   Not on file   Social Determinants of Health   Financial Resource Strain: Not on file  Food Insecurity: No Food Insecurity   Worried About Charity fundraiser in the Last Year: Never true   [place] in the Last Year: Never true  Transportation Needs: No Transportation Needs   Lack of Transportation (Medical): No   Lack of Transportation (Non-Medical): No  Physical Activity: Insufficiently Active   Days of Exercise per Week: 2 days   Minutes of Exercise per Session: 20 min  Stress: Stress Concern Present   Feeling of Stress : To some extent  Social Connections: Moderately Isolated   Frequency of Communication with Friends and Family: Three times a week   Frequency of Social Gatherings with Friends and Family: Three times a week   Attends Religious Services: More than 4 times per year   Active Member of Clubs or Organizations: No   Attends Archivist Meetings: Never   Marital Status: Divorced    Tobacco Counseling Counseling given: Not Answered   Clinical Intake:  Pre-visit preparation completed: Yes  Pain : 0-10     Nutritional Risks: None Diabetes: No  How often do you need to have someone help you when you read instructions, pamphlets, or other written materials from your doctor or pharmacy?: 1 - Never  Diabetic?  no  Interpreter Needed?: No  Information entered by :: Leroy Kennedy  LPN   Activities of Daily Living    06/13/2021    9:46 AM 06/09/2021   12:35 PM  In your present state of health, do you have any difficulty performing the following activities:  Hearing? 0 0  Vision? 1 1  Difficulty concentrating or making decisions? 0 1  Walking or climbing stairs? 1 1  Dressing or bathing? 0 0  Doing errands, shopping? 0 0  Preparing Food and eating ? N N  Using the Toilet? N N  In the past six months, have you accidently leaked urine? N N  Do you have problems with loss of bowel control?  N N  Managing your Medications? N N  Managing your Finances? N N  Housekeeping or managing your Housekeeping? N N    Patient Care Team: Horald Pollen, MD as PCP - General (Internal Medicine) Chucky May, MD as Consulting Physician (Psychiatry)  Indicate any recent Medical Services you may have received from other than [facility] providers in the past year (date may be approximate).     Assessment:   This is a routine wellness examination for [place].  Hearing/Vision screen Hearing Screening - Comments:: No trouble hearing Vision Screening - Comments:: Up to date [facility]  Dietary issues and exercise activities discussed: Current Exercise Habits: Home exercise routine, Type of exercise: walking, Time (Minutes): 25, Frequency (Times/Week): 2, Weekly Exercise (Minutes/Week): 50, Intensity: Mild   Goals Addressed             This Visit's Progress    Weight (lb) < 200 lb (90.7 kg)         Depression Screen    06/13/2021    9:57 AM 06/13/2021    9:55 AM 04/02/2021    9:45 AM 04/23/2020    2:41 PM 04/23/2020    1:23 PM 11/29/2019   10:02 AM 05/24/2019    9:24 AM  PHQ 2/9 Scores  PHQ - 2 Score 0 0 0 '6 4 3 3  '$ PHQ- 9 Score    '27 11 14 14    '$ Fall Risk    06/13/2021    9:43 AM 06/09/2021   12:35 PM 04/02/2021    9:45 AM 04/23/2020   11:20 AM 11/29/2019   10:02 AM  [place] in the past year? 0 0 0 0 0  Number falls in past yr: 0  0 0   Injury  with Fall? 0 0 0    Risk for fall due to :    No Fall Risks   Follow up Falls evaluation completed;Education provided;Falls prevention discussed   Falls evaluation completed Falls evaluation completed    FALL RISK PREVENTION PERTAINING TO THE HOME:  Any stairs in or around the home? No  If so, are there any without handrails? No  Home free of loose throw rugs in walkways, pet beds, electrical cords, etc? Yes  Adequate lighting in your home to reduce risk of falls? Yes   ASSISTIVE DEVICES UTILIZED TO PREVENT FALLS:  Life alert? No  Use of a cane, walker or w/c? No  Grab bars in the bathroom? No  Shower chair or bench in shower? No  Elevated toilet seat or a handicapped toilet? No   TIMED UP AND GO:  Was the test performed? No .    Cognitive Function:        06/13/2021    9:44 AM 02/11/2019    2:07 PM  6CIT Screen  What Year? 0 points 0 points  What month? 0 points 0 points  What time? 0 points 0 points  Count back from 20 0 points 0 points  Months in reverse 0 points 0 points  Repeat phrase 0 points 0 points  Total Score 0 points 0 points    Immunizations Immunization History  Administered Date(s) Administered   Influenza Inj Mdck Quad Pf 11/18/2017   Influenza-Unspecified 11/18/2017, 10/24/2019   PFIZER(Purple Top)SARS-COV-2 Vaccination 03/31/2019, 04/21/2019    TDAP status: Due, Education has been provided regarding the importance of this vaccine. Advised may receive this vaccine at local pharmacy or Health Dept. Aware to provide a copy of the  vaccination record if obtained from local pharmacy or Health Dept. Verbalized acceptance and understanding.  Influenza   Education provided    Covid-19 vaccine status: Information provided on how to obtain vaccines.   Qualifies for Shingles Vaccine? Yes   Zostavax completed No   Shingrix Completed?: No.    Education has been provided regarding the importance of this vaccine. Patient has been advised to call insurance  company to determine out of pocket expense if they have not yet received this vaccine. Advised may also receive vaccine at local pharmacy or Health Dept. Verbalized acceptance and understanding.  Screening Tests Health Maintenance  Topic Date Due   TETANUS/TDAP  Never done   PAP SMEAR-Modifier  04/09/2019   COVID-19 Vaccine (3 - Booster for Pfizer series) 06/29/2021 (Originally 06/16/2019)   Zoster Vaccines- Shingrix (1 of 2) 09/13/2021 (Originally 11/27/2018)   COLONOSCOPY (Pts 45-30yr Insurance coverage will need to be confirmed)  06/14/2022 (Originally 11/26/2013)   INFLUENZA VACCINE  08/13/2021   MAMMOGRAM  10/11/2021   Hepatitis C Screening  Completed   HIV Screening  Completed   HPV VACCINES  Aged Out    Health Maintenance  Health Maintenance Due  Topic Date Due   TETANUS/TDAP  Never done   PAP SMEAR-Modifier  04/09/2019    Colonoscopy  patient will schedule after her up coming surgery order placed  Mammogram status: Ordered  . Pt provided with contact info and advised to call to schedule appt.     Lung Cancer Screening: (Low Dose CT Chest recommended if Age 53-80years, 30 pack-year currently smoking OR have quit w/in 15years.) does not qualify.   Lung Cancer Screening Referral:   Additional Screening:  Hepatitis C Screening: does not qualify; Completed 2023  Vision Screening: Recommended annual ophthalmology exams for early detection of glaucoma and other disorders of the eye. Is the patient up to date with their annual eye exam?  Yes  Who is the provider or what is the name of the office in which the patient attends annual [facility] exams[facility] provider, would they like to be referred to a provider to establish care? No .   Dental Screening: Recommended annual dental exams for proper oral hygiene  Community Resource Referral / Chronic Care Management: CRR required this visit?  No   CCM required this visit?  No      Plan:      I have personally reviewed and noted the following in the patient's chart:   Medical and social history Use of alcohol, tobacco or illicit drugs  Current medications and supplements including opioid prescriptions.  Functional ability and status Nutritional status Physical activity Advanced directives List of other physicians Hospitalizations, surgeries, and ER visits in previous 12 months Vitals Screenings to include cognitive, depression, and falls Referrals and appointments  In addition, I have reviewed and discussed with patient certain preventive protocols, quality metrics, and best practice recommendations. A written personalized care plan for preventive services as well as general preventive health recommendations were provided to patient.     JLeroy Kennedy LPN   [id number]  Nurse Notes:

## 2021-06-13 NOTE — Patient Instructions (Signed)
Ms. Kristina Johnston , Thank you for taking time to come for your Medicare Wellness Visit. I appreciate your ongoing commitment to your health goals. Please review the following plan we discussed and let me know if I can assist you in the future.   Screening recommendations/referrals: Colonoscopy: Education provided Mammogram: Education provided Bone Density:  Recommended yearly ophthalmology/optometry visit for glaucoma screening and checkup Recommended yearly dental visit for hygiene and checkup  Vaccinations: Influenza vaccine: Education provided  Tdap vaccine: Education provided Shingles vaccine: Education provided    Advanced directives: not on file Living Will  Conditions/risks identified:   Next appointment: 11-04-2021 @ 10:00 [place] 40-64 Years and Older, Female Preventive care refers to lifestyle choices and visits with your health care provider that can promote health and wellness. What does preventive care include? A yearly physical exam. This is also called an annual well check. Dental exams once or twice a year. Routine eye exams. Ask your health care provider how often you should have your eyes checked. Personal lifestyle choices, including: Daily care of your teeth and gums. Regular physical activity. Eating a healthy diet. Avoiding tobacco and drug use. Limiting alcohol use. Practicing safe sex. Taking low-dose aspirin every day. Taking vitamin and mineral supplements as recommended by your health care provider. What happens during an annual well check? The services and screenings done by your health care provider during your annual well check will depend on your age, overall health, lifestyle risk factors, and family history of disease. Counseling  Your health care provider may ask you questions about your: Alcohol use. Tobacco use. Drug use. Emotional well-being. Home and relationship well-being. Sexual activity. Eating habits. History of  falls. Memory and ability to understand (cognition). Work and work Statistician. Reproductive health. Screening  You may have the following tests or measurements: Height, weight, and BMI. Blood pressure. Lipid and cholesterol levels. These may be checked every 5 years, or more frequently if you are over 25 years old. Skin check. Lung cancer screening. You may have this screening every year starting at age 28 if you have a 30-pack-year history of smoking and currently smoke or have quit within the past 15 years. Fecal occult blood test (FOBT) of the stool. You may have this test every year starting at age 61. Flexible sigmoidoscopy or colonoscopy. You may have a sigmoidoscopy every 5 years or a colonoscopy every 10 years starting at age 25. Hepatitis C blood test. Hepatitis B blood test. Sexually transmitted disease (STD) testing. Diabetes screening. This is done by checking your blood sugar (glucose) after you have not eaten for a while (fasting). You may have this done every 1-3 years. Bone density scan. This is done to screen for osteoporosis. You may have this done starting at age 65. Mammogram. This may be done every 1-2 years. Talk to your health care provider about how often you should have regular mammograms. Talk with your health care provider about your test results, treatment options, and if necessary, the need for more tests. Vaccines  Your health care provider may recommend certain vaccines, such as: Influenza vaccine. This is recommended every year. Tetanus, diphtheria, and acellular pertussis (Tdap, Td) vaccine. You may need a Td booster every 10 years. Zoster vaccine. You may need this after age 98. Pneumococcal 13-valent conjugate (PCV13) vaccine. One dose is recommended after age 66. Pneumococcal polysaccharide (PPSV23) vaccine. One dose is recommended after age 20. Talk to your health care provider about which screenings and vaccines you need  and how often you need  them. This information is not intended to replace advice given to you by your health care provider. Make sure you discuss any questions you have with your health care provider. Document Released: 01/26/2015 Document Revised: 09/19/2015 Document Reviewed: 10/31/2014 Elsevier Interactive Patient Education  2017 [place] Prevention in the Home Falls can cause injuries. They can happen to people of all ages. There are many things you can do to make your home safe and to help prevent falls. What can I do on the outside of my home? Regularly fix the edges of walkways and driveways and fix any cracks. Remove anything that might make you trip as you walk through a door, such as a raised step or threshold. Trim any bushes or trees on the path to your home. Use bright outdoor lighting. Clear any walking paths of anything that might make someone trip, such as rocks or tools. Regularly check to see if handrails are loose or broken. Make sure that both sides of any steps have handrails. Any raised decks and porches should have guardrails on the edges. Have any leaves, snow, or ice cleared regularly. Use sand or salt on walking paths during winter. Clean up any spills in your garage right away. This includes oil or grease spills. What can I do in the bathroom? Use night lights. Install grab bars by the toilet and in the tub and shower. Do not use towel bars as grab bars. Use non-skid mats or decals in the tub or shower. If you need to sit down in the shower, use a plastic, non-slip stool. Keep the floor dry. Clean up any water that spills on the floor as soon as it happens. Remove soap buildup in the tub or shower regularly. Attach bath mats securely with double-sided non-slip rug tape. Do not have throw rugs and other things on the floor that can make you trip. What can I do in the bedroom? Use night lights. Make sure that you have a light by your bed that is easy to reach. Do not use  any sheets or blankets that are too big for your bed. They should not hang down onto the floor. Have a firm chair that has side arms. You can use this for support while you get dressed. Do not have throw rugs and other things on the floor that can make you trip. What can I do in the kitchen? Clean up any spills right away. Avoid walking on wet floors. Keep items that you use a lot in easy-to-reach places. If you need to reach something above you, use a strong step stool that has a grab bar. Keep electrical cords out of the way. Do not use floor polish or wax that makes floors slippery. If you must use wax, use non-skid floor wax. Do not have throw rugs and other things on the floor that can make you trip. What can I do with my stairs? Do not leave any items on the stairs. Make sure that there are handrails on both sides of the stairs and use them. Fix handrails that are broken or loose. Make sure that handrails are as long as the stairways. Check any carpeting to make sure that it is firmly attached to the stairs. Fix any carpet that is loose or worn. Avoid having throw rugs at the top or bottom of the stairs. If you do have throw rugs, attach them to the floor with carpet tape. Make sure that you  have a light switch at the top of the stairs and the bottom of the stairs. If you do not have them, ask someone to add them for you. What else can I do to help prevent falls? Wear shoes that: Do not have high heels. Have rubber bottoms. Are comfortable and fit you well. Are closed at the toe. Do not wear sandals. If you use a stepladder: Make sure that it is fully opened. Do not climb a closed stepladder. Make sure that both sides of the stepladder are locked into place. Ask someone to hold it for you, if possible. Clearly mark and make sure that you can see: Any grab bars or handrails. First and last steps. Where the edge of each step is. Use tools that help you move around (mobility aids)  if they are needed. These include: Canes. Walkers. Scooters. Crutches. Turn on the lights when you go into a dark area. Replace any light bulbs as soon as they burn out. Set up your furniture so you have a clear path. Avoid moving your furniture around. If any of your floors are uneven, fix them. If there are any pets around you, be aware of where they are. Review your medicines with your doctor. Some medicines can make you feel dizzy. This can increase your chance of falling. Ask your doctor what other things that you can do to help prevent falls. This information is not intended to replace advice given to you by your health care provider. Make sure you discuss any questions you have with your health care provider. Document Released: 10/26/2008 Document Revised: 06/07/2015 Document Reviewed: 02/03/2014 Elsevier Interactive Patient Education  2017 [employer].

## 2021-06-15 ENCOUNTER — Other Ambulatory Visit: Payer: Self-pay | Admitting: Emergency Medicine

## 2021-06-15 DIAGNOSIS — I10 Essential (primary) hypertension: Secondary | ICD-10-CM

## 2021-06-20 ENCOUNTER — Ambulatory Visit (INDEPENDENT_AMBULATORY_CARE_PROVIDER_SITE_OTHER): Payer: Medicare Other

## 2021-06-20 ENCOUNTER — Encounter: Payer: Self-pay | Admitting: Obstetrics & Gynecology

## 2021-06-20 ENCOUNTER — Telehealth: Payer: Self-pay | Admitting: *Deleted

## 2021-06-20 ENCOUNTER — Other Ambulatory Visit: Payer: Self-pay | Admitting: Obstetrics & Gynecology

## 2021-06-20 ENCOUNTER — Ambulatory Visit (INDEPENDENT_AMBULATORY_CARE_PROVIDER_SITE_OTHER): Payer: Medicare Other | Admitting: Obstetrics & Gynecology

## 2021-06-20 VITALS — BP 118/74 | HR 86 | Resp 16 | Ht 64.75 in | Wt 186.0 lb

## 2021-06-20 DIAGNOSIS — R42 Dizziness and giddiness: Secondary | ICD-10-CM | POA: Diagnosis not present

## 2021-06-20 DIAGNOSIS — D219 Benign neoplasm of connective and other soft tissue, unspecified: Secondary | ICD-10-CM

## 2021-06-20 DIAGNOSIS — N852 Hypertrophy of uterus: Secondary | ICD-10-CM

## 2021-06-20 DIAGNOSIS — N926 Irregular menstruation, unspecified: Secondary | ICD-10-CM | POA: Diagnosis not present

## 2021-06-20 DIAGNOSIS — Z3041 Encounter for surveillance of contraceptive pills: Secondary | ICD-10-CM

## 2021-06-20 LAB — CBC
HCT: 44.4 % (ref 35.0–45.0)
Hemoglobin: 14.9 g/dL (ref 11.7–15.5)
MCH: 30.8 pg (ref 27.0–33.0)
MCHC: 33.6 g/dL (ref 32.0–36.0)
MCV: 91.7 fL (ref 80.0–100.0)
MPV: 10.3 fL (ref 7.5–12.5)
Platelets: 290 10*3/uL (ref 140–400)
RBC: 4.84 10*6/uL (ref 3.80–5.10)
RDW: 13.3 % (ref 11.0–15.0)
WBC: 4.2 10*3/uL (ref 3.8–10.8)

## 2021-06-20 NOTE — Telephone Encounter (Signed)
Per Dr. Dellis Filbert -[facility] with Bilateral salpingectomy. Will need 2hrs. Vertical incision. Outpatient with overnight stay.     Spoke with patient while in office regarding surgery planning. Reviewed surgery dates, patient agreeable to proceed with 6/27 or 7/25. Advised patient I will return call once surgery is scheduled and confirmed. Patient agreeable.    Unable to post case at [facility].  Call placed to [employer], spoke with Delilah Shan. Was advise case required to be scheduled at [facility]. Case posted.

## 2021-06-20 NOTE — Progress Notes (Signed)
    Kristina Johnston [birth year] [id number]        51 y.o.  G1P1   RP: Very large symptomatic Uterine Fibroids for Pelvic US  HPI: DepoLupron was not covered, so patient didn't receive it.   OB History  Gravida Para Term Preterm AB Living  '1 1       1  '$ SAB IAB Ectopic Multiple Live Births               # Outcome Date GA Lbr Len/2nd Weight Sex Delivery Anes PTL Lv  1 Para             Past medical history,surgical history, problem list, medications, allergies, family history and social history were all reviewed and documented in the EPIC chart.   Directed ROS with pertinent positives and negatives documented in the history of present illness/assessment and plan.  Exam:  Vitals:   06/20/21 0927  BP: 118/74  Pulse: 86  Resp: 16  SpO2: 97%  Weight: 186 lb (84.4 kg)  Height: 5' 4.75" (1.645 m)   General appearance:  Normal  Abdomen: Very large uterus reaching the lower aspect of the rib cage.  Pelvic US today:   Assessment/Plan:  51 y.o. G1P1   1. Fibroids Decision to proceed with TAH/BS through a vertical midline incision.  Preop done.  Will take her Amlodipine with a tiny zip of water on the morning of surgery. - CBC  2. Encounter for surveillance of contraceptive pills  3. Dizzy spells - CBC  4. Irregular menses - CBC  Other orders - loratadine (CLARITIN) 10 MG tablet; Take 10 mg by mouth daily.                         Patient was counseled as to the risk of surgery to include the following:  1. Infection (prohylactic antibiotics will be administered)  2. DVT/Pulmonary Embolism (prophylactic pneumo compression stockings will be used)  3.Trauma to internal organs requiring additional surgical procedure to repair any injury to internal organs requiring perhaps additional hospitalization days.  4.Hemmorhage requiring transfusion and blood products which carry risks such as anaphylactic reaction, hepatitis and AIDS  Patient had received literature  information on the procedure scheduled and all her questions were answered and fully accepts all risk.   [name] MD, 9:58 AM 06/20/2021

## 2021-06-21 NOTE — Telephone Encounter (Signed)
Call to patient to provide update. Patient agreeable to [facility] on 08/06/21.   Patient is requesting benefits.  Routing to [employer].

## 2021-06-23 ENCOUNTER — Encounter: Payer: Self-pay | Admitting: Obstetrics & Gynecology

## 2021-06-24 NOTE — Telephone Encounter (Signed)
Patient left message on surgery line. Call returned to patient.   Patient reports episodes of feeling lightheaded and dizzy when walking and occasionally after eating. States this has been occurring over the last 4 years, increasing over the last few months. Patient reports dizziness after eating a peach earlier today. Denies vaginal bleeding at this time or any other GYN concerns. Denies any symptoms at this time, symptoms have resolved. I asked if patient has discussed with PCP, patient advised "no". Recommended patient f/u with PCP for further evaluation. If symptoms worsen or new symptoms develop, ER for further evaluation. Advised I will update Dr. Dellis Filbert and f/u if any additional recommendations. Patient verbalizes understanding and is agreeable.   Dr. Dellis Filbert -please review.

## 2021-06-25 ENCOUNTER — Telehealth: Payer: Self-pay | Admitting: Emergency Medicine

## 2021-06-25 NOTE — Telephone Encounter (Signed)
Spoke with patient regarding surgery benefits. Patient acknowledges understanding of information presented. Patient is aware that benefits presented are professional benefits only. Patient is aware the hospital will call with facility benefits. See account note.  Routing to Jill Hamm, RN.  

## 2021-06-25 NOTE — Telephone Encounter (Signed)
Pt states she has been feeling dizzy and "she knows her body."   She states at last lab visit, she had some labs and is freaking out because she thinks she may have cancer.    Requesting call back from assistant.

## 2021-06-25 NOTE — Telephone Encounter (Signed)
Called patient in reference to her message. Patient stats she has been dizzy. Patient was concern about her labs. I informed patient her labs was normal. Patient is experiencing heavy bleeding due to fibroids.  I made patient aware that she can make an appt with her PCP for the dizziness or with her GYN since the dizziness could be coming from her heavy bleeding. Patient states she was going to call her GYN provider to schedule an appt.

## 2021-06-26 NOTE — Telephone Encounter (Signed)
Spoke with patient.  Patient reports she is feeling much better today, denies any symptoms. States that she feels better after she eats. States she has been reducing sodium in her diet. Denies vaginal bleeding at this time. Denies lightheadedness, dizziness, weakness, or SOB.   Spoke with patient. Surgery date request confirmed.  Advised surgery is scheduled for [facility], 08/06/21 at 0830.  Surgery instruction sheet and hospital brochure reviewed, printed copy will be mailed.  Patient verbalizes understanding, read back instructions and is agreeable.    Routing to provider for final review.   Cc: [facility]

## 2021-07-02 ENCOUNTER — Other Ambulatory Visit: Payer: Medicare Other | Admitting: Obstetrics & Gynecology

## 2021-07-02 ENCOUNTER — Other Ambulatory Visit: Payer: Medicare Other

## 2021-07-05 ENCOUNTER — Other Ambulatory Visit: Payer: Self-pay | Admitting: Obstetrics & Gynecology

## 2021-07-09 ENCOUNTER — Ambulatory Visit (HOSPITAL_BASED_OUTPATIENT_CLINIC_OR_DEPARTMENT_OTHER): Admit: 2021-07-09 | Payer: Medicare Other | Admitting: Obstetrics & Gynecology

## 2021-07-09 ENCOUNTER — Encounter (HOSPITAL_BASED_OUTPATIENT_CLINIC_OR_DEPARTMENT_OTHER): Payer: Self-pay

## 2021-07-09 SURGERY — HYSTERECTOMY, ABDOMINAL
Anesthesia: Choice

## 2021-07-18 ENCOUNTER — Encounter: Payer: Self-pay | Admitting: Obstetrics & Gynecology

## 2021-07-18 ENCOUNTER — Ambulatory Visit (INDEPENDENT_AMBULATORY_CARE_PROVIDER_SITE_OTHER): Payer: Medicare Other | Admitting: Obstetrics & Gynecology

## 2021-07-18 VITALS — BP 110/72 | HR 86 | Ht 64.75 in | Wt 184.0 lb

## 2021-07-18 DIAGNOSIS — N921 Excessive and frequent menstruation with irregular cycle: Secondary | ICD-10-CM

## 2021-07-18 DIAGNOSIS — D219 Benign neoplasm of connective and other soft tissue, unspecified: Secondary | ICD-10-CM | POA: Diagnosis not present

## 2021-07-18 NOTE — Progress Notes (Signed)
Kristina Johnston [birth year] [id number]        51 y.o.  G1P1   RP: Preop TAH/BS on 08/06/21 for large symptomatic Fibroids  HPI:  DepoLupron was not covered, so patient didn't receive it.  Irregular and heavy menstrual period since December 2022.  Currently controlling the heavy flow with Megace.  Feels very full with pressure.  Can feel her uterus with fibroids throughout her abdomen.   OB History  Gravida Para Term Preterm AB Living  '1 1       1  '$ SAB IAB Ectopic Multiple Live Births               # Outcome Date GA Lbr Len/2nd Weight Sex Delivery Anes PTL Lv  1 Para             Past medical history,surgical history, problem list, medications, allergies, family history and social history were all reviewed and documented in the EPIC chart.   Directed ROS with pertinent positives and negatives documented in the history of present illness/assessment and plan.  Exam:  Vitals:   07/18/21 0838  BP: 110/72  Pulse: 86  SpO2: 99%  Weight: 184 lb (83.5 kg)  Height: 5' 4.75" (1.645 m)   General appearance:  Normal  Pelvic US 06/20/21: Comparison is made with previous scan December 2022.  Transabdominal images.  Grossly enlarged uterus with multiple fibroids.  The size and appearance of the uterus and the largest fibroids are unchanged since the previous scan.  The overall uterine size is measured at 24.88 x 18.2 x 15.88 cm.  The largest fibroid is measured at 13.13 x 12.10 cm.  The endometrial lining shows a small amount of fluid within the cavity and the combined wall thickness is measured at 4.2 mm with no obvious endometrial mass.  Both ovaries are normal in size with no visible follicles.  No adnexal mass.  No free fluid in the pelvis.   Hb 06/20/21 at 14.9   Assessment/Plan:  51 y.o. G1P1   1. Fibroids DepoLupron was not covered, so patient didn't receive it.  Irregular and heavy menstrual period since December 2022.  Currently controlling the heavy flow with Megace.  Feels very  full with pressure.  Can feel her uterus with fibroids throughout her abdomen.  Pelvic US on 06/20/21 confirmed very large Fibroids with an overall uterine size at 24.88 x 18.2 x 15.88 cm.  The endometrial line was thin and both ovaries were normal.  Decision to proceed with a TAH/BS through a large Pfannenstiel or a midline vertical incision.  Preop preparation, surgery and risks, as well as postop expectations and precautions reviewed.  Med management preop and on the day of surgery reviewed.  Patient voice understanding and agreement.  2. Menometrorrhagia  Will continue on Megace until surgery.  Hb was good at 14.9 on 06/20/21.                         Patient was counseled as to the risk of surgery to include the following:  1. Infection (prohylactic antibiotics will be administered)  2. DVT/Pulmonary Embolism (prophylactic pneumo compression stockings will be used)  3.Trauma to internal organs requiring additional surgical procedure to repair any injury to internal organs requiring perhaps additional hospitalization days.  4.Hemmorhage requiring transfusion and blood products which carry risks such as anaphylactic reaction, hepatitis and AIDS  Patient had received literature information on the procedure scheduled and all her questions were answered and  fully accepts all risk.    [name] MD, 9:45 AM 07/18/2021

## 2021-07-19 ENCOUNTER — Telehealth: Payer: Self-pay

## 2021-07-19 NOTE — Telephone Encounter (Signed)
Pt calling to inquire if she should invest in a recliner for her to rest in during her recovery after the hysterectomy on 08/06/21 or if the way a recliner sits would put her more at risk for a blood clot vs resting in a bed. Please advise.

## 2021-07-23 NOTE — Telephone Encounter (Signed)
Spoke with patient and informed her. °

## 2021-07-23 NOTE — Telephone Encounter (Signed)
Kristina Bruins, MD  Judy Pimple D, CMA 4 hours ago (9:18 AM)   I would sleep in a bed and stay active, moving around during the day as she recovers.  No need to invest in a recliner.      Left message for patient to call.

## 2021-07-25 ENCOUNTER — Telehealth: Payer: Self-pay | Admitting: Emergency Medicine

## 2021-07-25 NOTE — Telephone Encounter (Signed)
Pt is requesting a callback. She stated she is having a hysterectomy soon and her gynecologist told her that she is prediabetic. Pt stated her lab results did not show that she is prediabetic. Pt would like to know if she is prediabetic.    Please advise. CB: [phone]  Pt stated it is ok to leave a message with advice if she does not answer due to her being at a dentist appointment this morning.

## 2021-07-26 NOTE — Telephone Encounter (Signed)
Called patient and informed patient that after looking through her office notes,  labs, and diagnosis I didn't see any indication that she is prediabetic.

## 2021-07-29 NOTE — Telephone Encounter (Signed)
[name], MD  You 55 minutes ago (10:04 AM)    Agree.

## 2021-07-29 NOTE — Telephone Encounter (Addendum)
Pt would like provider to approve home health aid to assist her after surgery. Patient would like recommendation on a home health aid service center.I explained to patient she would have to call [employer] to see who is in network .  I will route to Glorianne Manchester for recommendations   [facility] [phone]

## 2021-07-29 NOTE — Telephone Encounter (Signed)
Routing to Dr. Dellis Filbert to review patient request for home health aid.

## 2021-07-30 NOTE — Progress Notes (Signed)
Surgical Instructions    Your procedure is scheduled on Tuesday July 25.  Report to [facility] Main Entrance "A" at 6:30 A.M., then check in with the Admitting office.  Call this number if you have problems the morning of surgery:  [phone]   If you have any questions prior to your surgery date call [phone]: Open Monday-Friday 8am-4pm    Remember:  Do not eat or drink anything after midnight the night before your surgery    Take these medicines the morning of surgery with A SIP OF WATER:  amLODipine (NORVASC) loratadine (CLARITIN)  megestrol (MEGACE) Potassium   As of today, STOP taking any Aspirin (unless otherwise instructed by your surgeon) Aleve, Naproxen, Ibuprofen, Motrin, Advil, Goody's, BC's, all herbal medications, fish oil, and all vitamins.           Do not wear jewelry or makeup Do not wear lotions, powders, perfumes/colognes, or deodorant. Do not shave 48 hours prior to surgery.  Men may shave face and neck. Do not bring valuables to the hospital. Do not wear nail polish, gel polish, artificial nails, or any other type of covering on natural nails (fingers and toes) If you have artificial nails or gel coating that need to be removed by a nail salon, please have this removed prior to surgery. Artificial nails or gel coating may interfere with anesthesia's ability to adequately monitor your vital signs.  [facility] is not responsible for any belongings or valuables. .   Do NOT Smoke (Tobacco/Vaping)  24 hours prior to your procedure  If you use a CPAP at night, you may bring your mask for your overnight stay.   Contacts, glasses, hearing aids, dentures or partials may not be worn into surgery, please bring cases for these belongings   For patients admitted to the hospital, discharge time will be determined by your treatment team.   Patients discharged the day of surgery will not be allowed to drive home, and someone needs to stay with them for 24  hours.   SURGICAL WAITING ROOM VISITATION Patients having surgery or a procedure may have no more than 2 support people in the waiting area - these visitors may rotate.   Children under the age of 79 must have an adult with them who is not the patient. If the patient needs to stay at the hospital during part of their recovery, the visitor guidelines for inpatient rooms apply. Pre-op nurse will coordinate an appropriate time for 1 support person to accompany patient in pre-op.  This support person may not rotate.   Please refer to the [facility] website for the visitor guidelines for Inpatients (after your surgery is over and you are in a regular room).    Special instructions:    Oral Hygiene is also important to reduce your risk of infection.  Remember - BRUSH YOUR TEETH THE MORNING OF SURGERY WITH YOUR REGULAR TOOTHPASTE   - Preparing For Surgery  Before surgery, you can play an important role. Because skin is not sterile, your skin needs to be as free of germs as possible. You can reduce the number of germs on your skin by washing with CHG (chlorahexidine gluconate) Soap before surgery.  CHG is an antiseptic cleaner which kills germs and bonds with the skin to continue killing germs even after washing.     Please do not use if you have an allergy to CHG or antibacterial soaps. If your skin becomes reddened/irritated stop using the CHG.  Do not  shave (including legs and underarms) for at least 48 hours prior to first CHG shower. It is OK to shave your face.  Please follow these instructions carefully.     Shower the NIGHT BEFORE SURGERY and the MORNING OF SURGERY with CHG Soap.   If you chose to wash your hair, wash your hair first as usual with your normal shampoo. After you shampoo, rinse your hair and body thoroughly to remove the shampoo.  Then [employer] and genitals (private parts) with your normal soap and rinse thoroughly to remove soap.  After that Use CHG Soap as  you would any other liquid soap. You can apply CHG directly to the skin and wash gently with a scrungie or a clean washcloth.   Apply the CHG Soap to your body ONLY FROM THE NECK DOWN.  Do not use on open wounds or open sores. Avoid contact with your eyes, ears, mouth and genitals (private parts). Wash Face and genitals (private parts)  with your normal soap.   Wash thoroughly, paying special attention to the area where your surgery will be performed.  Thoroughly rinse your body with warm water from the neck down.  DO NOT shower/wash with your normal soap after using and rinsing off the CHG Soap.  Pat yourself dry with a CLEAN TOWEL.  Wear CLEAN PAJAMAS to bed the night before surgery  Place CLEAN SHEETS on your bed the night before your surgery  DO NOT SLEEP WITH PETS.   Day of Surgery:  Take a shower with CHG soap. Wear Clean/Comfortable clothing the morning of surgery Do not apply any deodorants/lotions.   Remember to brush your teeth WITH YOUR REGULAR TOOTHPASTE.    If you received a COVID test during your pre-op visit, it is requested that you wear a mask when out in public, stay away from anyone that may not be feeling well, and notify your surgeon if you develop symptoms. If you have been in contact with anyone that has tested positive in the last 10 days, please notify your surgeon.    Please read over the following fact sheets that you were given.

## 2021-07-31 ENCOUNTER — Encounter (HOSPITAL_COMMUNITY): Payer: Self-pay

## 2021-07-31 ENCOUNTER — Other Ambulatory Visit: Payer: Self-pay

## 2021-07-31 ENCOUNTER — Encounter (HOSPITAL_COMMUNITY)
Admission: RE | Admit: 2021-07-31 | Discharge: 2021-07-31 | Disposition: A | Discharge status: Home / Self Care | Payer: Medicare Other | Source: Ambulatory Visit | Attending: Obstetrics & Gynecology | Admitting: Obstetrics & Gynecology

## 2021-07-31 VITALS — BP 141/90 | HR 83 | Temp 98.6°F | Resp 18 | Ht 65.0 in | Wt 183.5 lb

## 2021-07-31 DIAGNOSIS — I10 Essential (primary) hypertension: Secondary | ICD-10-CM | POA: Insufficient documentation

## 2021-07-31 DIAGNOSIS — Z01818 Encounter for other preprocedural examination: Secondary | ICD-10-CM | POA: Insufficient documentation

## 2021-07-31 DIAGNOSIS — E669 Obesity, unspecified: Secondary | ICD-10-CM | POA: Diagnosis not present

## 2021-07-31 HISTORY — DX: Anemia, unspecified: D64.9

## 2021-07-31 HISTORY — DX: Bipolar disorder, unspecified: F31.9

## 2021-07-31 HISTORY — DX: Essential (primary) hypertension: I10

## 2021-07-31 LAB — BASIC METABOLIC PANEL
Anion gap: 13 (ref 5–15)
BUN: 11 mg/dL (ref 6–20)
CO2: 24 mmol/L (ref 22–32)
Calcium: 10 mg/dL (ref 8.9–10.3)
Chloride: 107 mmol/L (ref 98–111)
Creatinine, Ser: 0.94 mg/dL (ref 0.44–1.00)
GFR, Estimated: >60 mL/min (ref >60)
Glucose, Bld: 95 mg/dL (ref 70–99)
Potassium: 3.8 mmol/L (ref 3.5–5.1)
Sodium: 144 mmol/L (ref 135–145)

## 2021-07-31 LAB — GLUCOSE, CAPILLARY: Glucose-Capillary: 92 mg/dL (ref 70–99)

## 2021-07-31 LAB — CBC
HCT: 44.5 % (ref 36.0–46.0)
Hemoglobin: 14.9 g/dL (ref 12.0–15.0)
MCH: 30.3 pg (ref 26.0–34.0)
MCHC: 33.5 g/dL (ref 30.0–36.0)
MCV: 90.4 fL (ref 80.0–100.0)
Platelets: 253 10*3/uL (ref 150–400)
RBC: 4.92 MIL/uL (ref 3.87–5.11)
RDW: 13.1 % (ref 11.5–15.5)
WBC: 4.8 10*3/uL (ref 4.0–10.5)
nRBC: 0 % (ref 0.0–0.2)

## 2021-07-31 NOTE — Progress Notes (Signed)
T&S resulted with antibodies. Will need to recollect DOS.

## 2021-07-31 NOTE — Progress Notes (Signed)
PCP - Dr. Agustina Caroli Cardiologist - denies  PPM/ICD - denies   Chest x-ray - 09/12/17 EKG - 07/31/21 Stress Test - denies ECHO - denies Cardiac Cath - denies  Sleep Study - denies   DM- denies  ASA/Blood Thinner Instructions: n/a   ERAS Protcol - no, NPO   COVID TEST- n/a   Anesthesia review: no  Patient denies shortness of breath, fever, cough and chest pain at PAT appointment   All instructions explained to the patient, with a verbal understanding of the material. Patient agrees to go over the instructions while at home for a better understanding. The opportunity to ask questions was provided.

## 2021-08-05 ENCOUNTER — Other Ambulatory Visit: Payer: Self-pay | Admitting: Obstetrics & Gynecology

## 2021-08-05 NOTE — Telephone Encounter (Signed)
Patient scheduled for surgery on 08/05/21 note attached to Rx " requesting 90 day supply:"

## 2021-08-05 NOTE — Telephone Encounter (Signed)
Kristina Johnston,   Did patient follow up on this request?

## 2021-08-05 NOTE — Telephone Encounter (Signed)
Yes. Patient would like to use  [facility] [phone] Second week after surgery.   Patient also would like to know if she can have any solid foods now or tonight. Patient states she cannot sleep with just liquid foods.  I will route to Glorianne Manchester for further assistance.     Patient aware she can eat solid food before Midnight per Dr. Dellis Filbert request. Pt understands to contact home health to see if the service requesting can be approved.

## 2021-08-06 ENCOUNTER — Inpatient Hospital Stay (HOSPITAL_COMMUNITY)
Admission: RE | Admit: 2021-08-06 | Discharge: 2021-08-07 | DRG: 743 | Disposition: A | Discharge status: Home / Self Care | Payer: Medicare Other | Source: Ambulatory Visit | Attending: Obstetrics & Gynecology | Admitting: Obstetrics & Gynecology

## 2021-08-06 ENCOUNTER — Encounter (HOSPITAL_COMMUNITY): Payer: Self-pay | Admitting: Obstetrics & Gynecology

## 2021-08-06 ENCOUNTER — Observation Stay (HOSPITAL_COMMUNITY): Payer: Medicare Other | Admitting: Certified Registered Nurse Anesthetist

## 2021-08-06 ENCOUNTER — Other Ambulatory Visit: Payer: Self-pay

## 2021-08-06 ENCOUNTER — Observation Stay (HOSPITAL_BASED_OUTPATIENT_CLINIC_OR_DEPARTMENT_OTHER): Payer: Medicare Other | Admitting: Certified Registered Nurse Anesthetist

## 2021-08-06 ENCOUNTER — Encounter (HOSPITAL_COMMUNITY)
Admission: RE | Disposition: A | Discharge status: Home / Self Care | Payer: Self-pay | Source: Ambulatory Visit | Attending: Obstetrics & Gynecology

## 2021-08-06 DIAGNOSIS — N921 Excessive and frequent menstruation with irregular cycle: Secondary | ICD-10-CM | POA: Diagnosis present

## 2021-08-06 DIAGNOSIS — D259 Leiomyoma of uterus, unspecified: Principal | ICD-10-CM | POA: Diagnosis present

## 2021-08-06 DIAGNOSIS — D219 Benign neoplasm of connective and other soft tissue, unspecified: Secondary | ICD-10-CM | POA: Diagnosis not present

## 2021-08-06 DIAGNOSIS — N858 Other specified noninflammatory disorders of uterus: Secondary | ICD-10-CM | POA: Diagnosis not present

## 2021-08-06 DIAGNOSIS — D252 Subserosal leiomyoma of uterus: Secondary | ICD-10-CM | POA: Diagnosis not present

## 2021-08-06 DIAGNOSIS — Z01818 Encounter for other preprocedural examination: Principal | ICD-10-CM

## 2021-08-06 DIAGNOSIS — Z9889 Other specified postprocedural states: Secondary | ICD-10-CM

## 2021-08-06 HISTORY — PX: HYSTERECTOMY ABDOMINAL WITH SALPINGECTOMY: SHX6725

## 2021-08-06 LAB — ABO/RH: ABO/RH(D): AB POS

## 2021-08-06 LAB — POCT PREGNANCY, URINE: Preg Test, Ur: NEGATIVE

## 2021-08-06 SURGERY — HYSTERECTOMY, TOTAL, ABDOMINAL, WITH SALPINGECTOMY
Anesthesia: General | Site: Abdomen | Laterality: Bilateral

## 2021-08-06 MED ORDER — SUGAMMADEX SODIUM 200 MG/2ML IV SOLN
INTRAVENOUS | Status: DC | PRN
  Administered 2021-08-06: 200 mg via INTRAVENOUS

## 2021-08-06 MED ORDER — DEXAMETHASONE SODIUM PHOSPHATE 10 MG/ML IJ SOLN
INTRAMUSCULAR | Status: DC | PRN
  Administered 2021-08-06: 10 mg via INTRAVENOUS

## 2021-08-06 MED ORDER — ACETAMINOPHEN 325 MG PO TABS: 650.0000 mg | ORAL_TABLET | ORAL | Status: DC | PRN

## 2021-08-06 MED ORDER — BUPIVACAINE LIPOSOME 1.3 % IJ SUSP
INTRAMUSCULAR | Status: AC
  Filled 2021-08-06: qty 20

## 2021-08-06 MED ORDER — OXYCODONE-ACETAMINOPHEN 5-325 MG PO TABS
2.0000 | ORAL_TABLET | ORAL | Status: DC | PRN
  Administered 2021-08-06 – 2021-08-07 (×3): 2 via ORAL
  Filled 2021-08-06 (×3): qty 2

## 2021-08-06 MED ORDER — VASOPRESSIN 20 UNIT/ML IV SOLN
INTRAVENOUS | Status: AC
  Filled 2021-08-06: qty 1

## 2021-08-06 MED ORDER — 0.9 % SODIUM CHLORIDE (POUR BTL) OPTIME
TOPICAL | Status: DC | PRN
  Administered 2021-08-06: 2000 mL

## 2021-08-06 MED ORDER — ROCURONIUM BROMIDE 10 MG/ML (PF) SYRINGE
PREFILLED_SYRINGE | INTRAVENOUS | Status: DC | PRN
  Administered 2021-08-06 (×2): 10 mg via INTRAVENOUS
  Administered 2021-08-06: 60 mg via INTRAVENOUS
  Administered 2021-08-06: 20 mg via INTRAVENOUS

## 2021-08-06 MED ORDER — HEMOSTATIC AGENTS (NO CHARGE) OPTIME
TOPICAL | Status: DC | PRN
  Administered 2021-08-06: 1 via TOPICAL

## 2021-08-06 MED ORDER — PHENYLEPHRINE HCL (PRESSORS) 10 MG/ML IV SOLN
INTRAVENOUS | Status: AC
  Filled 2021-08-06: qty 1

## 2021-08-06 MED ORDER — PROPOFOL 10 MG/ML IV BOLUS
INTRAVENOUS | Status: DC | PRN
  Administered 2021-08-06: 150 mg via INTRAVENOUS

## 2021-08-06 MED ORDER — HYDROMORPHONE HCL 1 MG/ML IJ SOLN: 0.5000 mg | INTRAMUSCULAR | Status: DC | PRN

## 2021-08-06 MED ORDER — BSS IO SOLN
INTRAOCULAR | Status: AC
  Filled 2021-08-06: qty 15

## 2021-08-06 MED ORDER — LACTATED RINGERS IV SOLN: INTRAVENOUS | Status: DC

## 2021-08-06 MED ORDER — MEPERIDINE HCL 25 MG/ML IJ SOLN: 6.2500 mg | INTRAMUSCULAR | Status: DC | PRN

## 2021-08-06 MED ORDER — MIDAZOLAM HCL 2 MG/2ML IJ SOLN
INTRAMUSCULAR | Status: DC | PRN
  Administered 2021-08-06 (×2): 1 mg via INTRAVENOUS

## 2021-08-06 MED ORDER — CEFAZOLIN SODIUM-DEXTROSE 2-4 GM/100ML-% IV SOLN
2.0000 g | INTRAVENOUS | Status: AC
  Administered 2021-08-06: 2 g via INTRAVENOUS
  Filled 2021-08-06: qty 100

## 2021-08-06 MED ORDER — MIDAZOLAM HCL 2 MG/2ML IJ SOLN: 0.5000 mg | Freq: Once | INTRAMUSCULAR | Status: DC | PRN

## 2021-08-06 MED ORDER — ROCURONIUM BROMIDE 10 MG/ML (PF) SYRINGE
PREFILLED_SYRINGE | INTRAVENOUS | Status: AC
  Filled 2021-08-06: qty 30

## 2021-08-06 MED ORDER — LIDOCAINE 2% (20 MG/ML) 5 ML SYRINGE
INTRAMUSCULAR | Status: DC | PRN
  Administered 2021-08-06: 40 mg via INTRAVENOUS

## 2021-08-06 MED ORDER — PHENYLEPHRINE 80 MCG/ML (10ML) SYRINGE FOR IV PUSH (FOR BLOOD PRESSURE SUPPORT)
PREFILLED_SYRINGE | INTRAVENOUS | Status: DC | PRN
  Administered 2021-08-06 (×2): 80 ug via INTRAVENOUS

## 2021-08-06 MED ORDER — OXYCODONE HCL 5 MG/5ML PO SOLN: 5.0000 mg | Freq: Once | ORAL | Status: DC | PRN

## 2021-08-06 MED ORDER — SCOPOLAMINE 1 MG/3DAYS TD PT72
1.0000 | MEDICATED_PATCH | TRANSDERMAL | Status: DC
  Administered 2021-08-06: 1.5 mg via TRANSDERMAL
  Filled 2021-08-06: qty 1

## 2021-08-06 MED ORDER — FENTANYL CITRATE (PF) 250 MCG/5ML IJ SOLN
INTRAMUSCULAR | Status: DC | PRN
  Administered 2021-08-06 (×2): 50 ug via INTRAVENOUS
  Administered 2021-08-06: 100 ug via INTRAVENOUS
  Administered 2021-08-06 (×2): 50 ug via INTRAVENOUS

## 2021-08-06 MED ORDER — FENTANYL CITRATE (PF) 250 MCG/5ML IJ SOLN
INTRAMUSCULAR | Status: AC
  Filled 2021-08-06: qty 5

## 2021-08-06 MED ORDER — ACETAMINOPHEN 500 MG PO TABS
1000.0000 mg | ORAL_TABLET | Freq: Once | ORAL | Status: AC
  Administered 2021-08-06: 1000 mg via ORAL
  Filled 2021-08-06: qty 2

## 2021-08-06 MED ORDER — LACTATED RINGERS IV SOLN
INTRAVENOUS | Status: DC
Start: 2021-08-06 — End: 2021-08-07

## 2021-08-06 MED ORDER — PROPOFOL 10 MG/ML IV BOLUS
INTRAVENOUS | Status: AC
  Filled 2021-08-06: qty 20

## 2021-08-06 MED ORDER — SODIUM CHLORIDE 0.9 % IV SOLN
INTRAVENOUS | Status: DC | PRN
  Administered 2021-08-06: 10 mL

## 2021-08-06 MED ORDER — OXYCODONE HCL 5 MG PO TABS: 5.0000 mg | ORAL_TABLET | Freq: Once | ORAL | Status: DC | PRN

## 2021-08-06 MED ORDER — HYDROCODONE-ACETAMINOPHEN 5-325 MG PO TABS: 1.0000 | ORAL_TABLET | ORAL | Status: DC | PRN

## 2021-08-06 MED ORDER — PROMETHAZINE HCL 25 MG/ML IJ SOLN: 6.2500 mg | INTRAMUSCULAR | Status: DC | PRN

## 2021-08-06 MED ORDER — PHENYLEPHRINE HCL (PRESSORS) 10 MG/ML IV SOLN
INTRAVENOUS | Status: AC
Start: 2021-08-06 — End: ?
  Filled 2021-08-06: qty 1

## 2021-08-06 MED ORDER — CHLORHEXIDINE GLUCONATE 0.12 % MT SOLN
15.0000 mL | Freq: Once | OROMUCOSAL | Status: AC
  Administered 2021-08-06: 15 mL via OROMUCOSAL
  Filled 2021-08-06: qty 15

## 2021-08-06 MED ORDER — SODIUM CHLORIDE (PF) 0.9 % IJ SOLN
INTRAMUSCULAR | Status: AC
  Filled 2021-08-06: qty 100

## 2021-08-06 MED ORDER — MIDAZOLAM HCL 2 MG/2ML IJ SOLN
INTRAMUSCULAR | Status: AC
  Filled 2021-08-06: qty 2

## 2021-08-06 MED ORDER — ORAL CARE MOUTH RINSE: 15.0000 mL | Freq: Once | OROMUCOSAL | Status: AC

## 2021-08-06 MED ORDER — PHENYLEPHRINE HCL-NACL 20-0.9 MG/250ML-% IV SOLN
INTRAVENOUS | Status: DC | PRN
  Administered 2021-08-06: 20 ug/min via INTRAVENOUS

## 2021-08-06 MED ORDER — HYDROMORPHONE HCL 1 MG/ML IJ SOLN: 0.2500 mg | INTRAMUSCULAR | Status: DC | PRN

## 2021-08-06 MED ORDER — ONDANSETRON HCL 4 MG/2ML IJ SOLN
INTRAMUSCULAR | Status: DC | PRN
  Administered 2021-08-06: 4 mg via INTRAVENOUS

## 2021-08-06 MED ORDER — POVIDONE-IODINE 10 % EX SWAB: 2.0000 | Freq: Once | CUTANEOUS | Status: DC

## 2021-08-06 MED ORDER — EPHEDRINE SULFATE-NACL 50-0.9 MG/10ML-% IV SOSY
PREFILLED_SYRINGE | INTRAVENOUS | Status: DC | PRN
  Administered 2021-08-06: 2.5 mg via INTRAVENOUS

## 2021-08-06 MED ORDER — GLYCOPYRROLATE PF 0.2 MG/ML IJ SOSY
PREFILLED_SYRINGE | INTRAMUSCULAR | Status: DC | PRN
  Administered 2021-08-06: .1 mg via INTRAVENOUS

## 2021-08-06 SURGICAL SUPPLY — 41 items
BAG COUNTER SPONGE SURGICOUNT (BAG) ×3 IMPLANT
CANISTER SUCT 3000ML PPV (MISCELLANEOUS) ×2 IMPLANT
DERMABOND ADVANCED (GAUZE/BANDAGES/DRESSINGS)
DERMABOND ADVANCED .7 DNX12 (GAUZE/BANDAGES/DRESSINGS) IMPLANT
DRAPE CESAREAN BIRTH W POUCH (DRAPES) ×2 IMPLANT
DRAPE WARM FLUID 44X44 (DRAPES) IMPLANT
DRSG OPSITE POSTOP 4X10 (GAUZE/BANDAGES/DRESSINGS) ×2 IMPLANT
DURAPREP 26ML APPLICATOR (WOUND CARE) ×2 IMPLANT
GAUZE 4X4 16PLY ~~LOC~~+RFID DBL (SPONGE) ×2 IMPLANT
GLOVE BIO SURGEON STRL SZ 6.5 (GLOVE) ×2 IMPLANT
GLOVE BIO SURGEON STRL SZ7.5 (GLOVE) ×2 IMPLANT
GLOVE SURG UNDER POLY LF SZ7 (GLOVE) ×6 IMPLANT
GOWN STRL REUS W/ TWL LRG LVL3 (GOWN DISPOSABLE) ×3 IMPLANT
GOWN STRL REUS W/TWL LRG LVL3 (GOWN DISPOSABLE) ×3
HEMOSTAT ARISTA ABSORB 3G PWDR (HEMOSTASIS) ×1 IMPLANT
HIBICLENS CHG 4% 4OZ BTL (MISCELLANEOUS) ×3 IMPLANT
KIT TURNOVER KIT B (KITS) ×2 IMPLANT
LIGASURE IMPACT 36 18CM CVD LR (INSTRUMENTS) ×1 IMPLANT
NEEDLE HYPO 22GX1.5 SAFETY (NEEDLE) ×5 IMPLANT
PACK ABDOMINAL GYN (CUSTOM PROCEDURE TRAY) ×2 IMPLANT
PAD ARMBOARD 7.5X6 YLW CONV (MISCELLANEOUS) ×2 IMPLANT
PAD OB MATERNITY 4.3X12.25 (PERSONAL CARE ITEMS) ×2 IMPLANT
POWDER SURGICEL 3.0 GRAM (HEMOSTASIS) ×1 IMPLANT
RTRCTR C-SECT PINK 25CM LRG (MISCELLANEOUS) ×1 IMPLANT
SPIKE FLUID TRANSFER (MISCELLANEOUS) IMPLANT
SPONGE T-LAP 18X18 ~~LOC~~+RFID (SPONGE) ×4 IMPLANT
SUT PLAIN 2 0 XLH (SUTURE) ×1 IMPLANT
SUT PLAIN 3 0 CT 1 27 (SUTURE) IMPLANT
SUT PROLENE 3 0 SH DA (SUTURE) IMPLANT
SUT VIC AB 0 CT1 18XCR BRD8 (SUTURE) ×2 IMPLANT
SUT VIC AB 0 CT1 27 (SUTURE) ×3
SUT VIC AB 0 CT1 27XBRD ANBCTR (SUTURE) ×4 IMPLANT
SUT VIC AB 0 CT1 8-18 (SUTURE) ×3
SUT VIC AB 3-0 SH 27 (SUTURE) ×2
SUT VIC AB 3-0 SH 27X BRD (SUTURE) IMPLANT
SUT VIC AB 3-0 SH 27XBRD (SUTURE) IMPLANT
SUT VIC AB 4-0 PS2 27 (SUTURE) ×2 IMPLANT
SUT VICRYL 0 TIES 12 18 (SUTURE) ×2 IMPLANT
SYR CONTROL 10ML LL (SYRINGE) ×5 IMPLANT
TOWEL GREEN STERILE FF (TOWEL DISPOSABLE) ×4 IMPLANT
TRAY FOLEY W/BAG SLVR 14FR (SET/KITS/TRAYS/PACK) ×2 IMPLANT

## 2021-08-06 NOTE — Anesthesia Procedure Notes (Signed)
Procedure Name: Intubation Date/Time: 08/06/2021 9:12 AM  Performed by: Leonor Liv, CRNAPre-anesthesia Checklist: Patient identified, Emergency Drugs available, Suction available and Patient being monitored Patient Re-evaluated:Patient Re-evaluated prior to induction Oxygen Delivery Method: Circle System Utilized Preoxygenation: Pre-oxygenation with 100% oxygen Induction Type: IV induction Ventilation: Mask ventilation without difficulty Laryngoscope Size: Mac and 3 Grade View: Grade I Tube type: Oral Tube size: 7.5 mm Number of attempts: 1 Airway Equipment and Method: Stylet and Oral airway Placement Confirmation: ETT inserted through vocal cords under direct vision, positive ETCO2 and breath sounds checked- equal and bilateral Secured at: 21 cm Tube secured with: Tape Dental Injury: Teeth and Oropharynx as per pre-operative assessment

## 2021-08-06 NOTE — Anesthesia Postprocedure Evaluation (Signed)
Anesthesia Post Note  Patient: Kristina Johnston  Procedure(s) Performed: HYSTERECTOMY ABDOMINAL WITH BILATERAL SALPINGECTOMY (Bilateral: Abdomen)     Patient location during evaluation: PACU Anesthesia Type: General Level of consciousness: awake and alert, patient cooperative and oriented Pain management: pain level controlled Vital Signs Assessment: post-procedure vital signs reviewed and stable Respiratory status: spontaneous breathing, nonlabored ventilation and respiratory function stable Cardiovascular status: blood pressure returned to baseline and stable Postop Assessment: no apparent nausea or vomiting Anesthetic complications: no   No notable events documented.  Last Vitals:  Vitals:   08/06/21 1304 08/06/21 1334  BP: 125/63 131/76  Pulse: 89 (!) 101  Resp: 16   Temp: 36.8 C   SpO2: 96%     Last Pain:  Vitals:   08/06/21 1304  TempSrc: Oral  PainSc:                  Matthe Sloane,E. Rosaly Labarbera

## 2021-08-06 NOTE — H&P (Addendum)
Kristina Johnston is an 51 y.o. female.G1P1L1 Divorced   RP: TAH/BS on 08/06/21 for large symptomatic Fibroids   HPI:  No change x last visit 07/18/21.  Not currently bleeding. DepoLupron was not covered, so patient didn't receive it.  Irregular and heavy menstrual period since December 2022.  Currently controlling the heavy flow with Megace.  Feels very full with pressure.  Can feel her uterus with fibroids throughout her abdomen.   Pertinent Gynecological History: Menses:  Menometrorrhagia Contraception:  Abstinent, condoms as needed Blood transfusions: none Sexually transmitted diseases: No past history Last mammogram: normal  Last pap: normal     Menstrual History: Patient's last menstrual period was 01/02/2021 (exact date).    Past Medical History:  Diagnosis Date   Anemia    hx of   Anxiety    Bipolar disorder ([place])    Depression    Hypertension    Paranoid schizophrenia ([place])     Past Surgical History:  Procedure Laterality Date   DILATION AND CURETTAGE OF UTERUS  1995   pt had abortion and believes she had to have a D&E or D&C   WISDOM TOOTH EXTRACTION      Family History  Problem Relation Age of Onset   Heart disease Mother    Hyperlipidemia Mother    Mental illness Mother    Diabetes Father    Hypertension Sister    Hyperlipidemia Brother    Hypertension Brother    Cancer Maternal Grandmother    Cancer Maternal Grandfather    Hyperlipidemia Maternal Grandfather    Hypertension Maternal Grandfather    Breast cancer Maternal Grandfather    Cancer Paternal Grandmother 39       breast cancer   Hyperlipidemia Paternal Grandfather    Hypertension Paternal Grandfather    Cancer Maternal Aunt 1       breast cancer   Breast cancer Maternal Aunt    Breast cancer Cousin     Social History:  reports that she has never smoked. She has never used smokeless tobacco. She reports that she does not drink alcohol and does not use drugs.  Allergies: No Known  Allergies  Medications Prior to Admission  Medication Sig Dispense Refill Last Dose   amLODipine (NORVASC) 10 MG tablet TAKE 1 TABLET(10 MG) BY MOUTH DAILY 90 tablet 3 08/06/2021   Ascorbic Acid (VITAMIN C PO) Take 1,000 mg by mouth daily.   Past Week   ferrous sulfate 220 (44 Fe) MG/5ML solution Take 5 mLs (220 mg total) by mouth 2 (two) times daily. 300 mL 3 Past Week   Flaxseed, Linseed, (FLAXSEED OIL) 1000 MG CAPS Take 1,000 mg by mouth in the morning and at bedtime.   Past Week   haloperidol (HALDOL) 2 MG/ML solution Take 4 mg by mouth at bedtime.  12 08/05/2021   loratadine (CLARITIN) 10 MG tablet Take 10 mg by mouth daily.   08/06/2021   megestrol (MEGACE) 40 MG tablet TAKE 1 TABLET(40 MG) BY MOUTH TWICE DAILY 60 tablet 1 08/06/2021   Multiple Vitamin (MULTIVITAMIN PO) Take 1 tablet by mouth daily at 6 (six) AM.   Past Week   Potassium 99 MG TABS Take 99 mg by mouth daily.   Past Week   triamcinolone (KENALOG) 0.1 % APPLY TO SKIN TWICE A DAY FOR NO MORE THAN 2 WEEKS (Patient taking differently: Apply 1 Application topically 2 (two) times daily. APPLY TO SKIN TWICE A DAY FOR NO MORE THAN 2 WEEKS) 30 g 0 Past Week  Valproate Sodium (DEPAKENE) 250 MG/5ML SOLN solution Take 1,000 mg by mouth at bedtime.   08/05/2021    REVIEW OF SYSTEMS: A ROS was performed and pertinent positives and negatives are included in the history.  GENERAL: No fevers or chills. HEENT: No change in vision, no earache, sore throat or sinus congestion. NECK: No pain or stiffness. CARDIOVASCULAR: No chest pain or pressure. No palpitations. PULMONARY: No shortness of breath, cough or wheeze. GASTROINTESTINAL: No abdominal pain, nausea, vomiting or diarrhea, melena or bright red blood per rectum. GENITOURINARY: No urinary frequency, urgency, hesitancy or dysuria. MUSCULOSKELETAL: No joint or muscle pain, no back pain, no recent trauma. DERMATOLOGIC: No rash, no itching, no lesions. ENDOCRINE: No polyuria, polydipsia, no heat or  cold intolerance. No recent change in weight. HEMATOLOGICAL: No anemia or easy bruising or bleeding. NEUROLOGIC: No headache, seizures, numbness, tingling or weakness. PSYCHIATRIC: No depression, no loss of interest in normal activity or change in sleep pattern.   Physical Exam:  Blood pressure 139/90, pulse (!) 112, temperature 98.8 F (37.1 C), resp. rate 17, height '5\' 5"'$  (1.651 m), weight 81.2 kg, last menstrual period 01/02/2021, SpO2 98 %.  Lungs clear RCR  Results for orders placed or performed during the hospital encounter of 08/06/21 (from the past 24 hour(s))  Pregnancy, urine POC     Status: None   Collection Time: 08/06/21  6:44 AM  Result Value Ref Range   Preg Test, Ur NEGATIVE NEGATIVE   Pelvic US 06/20/21: Comparison is made with previous scan December 2022.  Transabdominal images.  Grossly enlarged uterus with multiple fibroids.  The size and appearance of the uterus and the largest fibroids are unchanged since the previous scan.  The overall uterine size is measured at 24.88 x 18.2 x 15.88 cm.  The largest fibroid is measured at 13.13 x 12.10 cm.  The endometrial lining shows a small amount of fluid within the cavity and the combined wall thickness is measured at 4.2 mm with no obvious endometrial mass.  Both ovaries are normal in size with no visible follicles.  No adnexal mass.  No free fluid in the pelvis.   Hb 06/20/21 at 14.9     Assessment/Plan:  51 y.o. G1P1    1. Fibroids DepoLupron was not covered, so patient didn't receive it.  Irregular and heavy menstrual period since December 2022.  Currently controlling the heavy flow with Megace.  Feels very full with pressure.  Can feel her uterus with fibroids throughout her abdomen.  Pelvic US on 06/20/21 confirmed very large Fibroids with an overall uterine size at 24.88 x 18.2 x 15.88 cm.  The endometrial line was thin and both ovaries were normal.  Decision to proceed with a TAH/BS through a large Pfannenstiel or a midline  vertical incision. Preop preparation, surgery and risks, as well as postop expectations and precautions reviewed.  Med management preop and on the day of surgery reviewed.  Patient voice understanding and agreement.   2. Menometrorrhagia  Will continue on Megace until surgery.  Hb was good at 14.9 on 06/20/21.                           Patient was counseled as to the risk of surgery to include the following:   1. Infection (prohylactic antibiotics will be administered)   2. DVT/Pulmonary Embolism (prophylactic pneumo compression stockings will be used)   3.Trauma to internal organs requiring additional surgical procedure to repair any injury  to internal organs requiring perhaps additional hospitalization days.   4.Hemmorhage requiring transfusion and blood products which carry risks such as anaphylactic reaction, hepatitis and AIDS   Patient had received literature information on the procedure scheduled and all her questions were answered and fully accepts all risk.    Marie-Lyne Jasmane Brockway 08/06/2021, 7:09 AM

## 2021-08-06 NOTE — Op Note (Signed)
Operative Note  08/06/2021  12:59 PM  PATIENT:  Kristina Johnston  51 y.o. female  PRE-OPERATIVE DIAGNOSIS:  Large multiple symptomatic Uterine fibroids  POST-OPERATIVE DIAGNOSIS:  Large multiple symptomatic Uterine fibroids  PROCEDURE:  Procedure(s): HYSTERECTOMY ABDOMINAL WITH BILATERAL SALPINGECTOMY  SURGEON:  Surgeon(s): [name], MD Assistant:  Olivia Mackie RNFA  ANESTHESIA:   general  FINDINGS: Extremely large fibroid uterus with normal tubes and cervix.  Bilateral ovaries normal.  Appendix normal.  DESCRIPTION OF OPERATION: Under general anesthesia with endotracheal intubation, the patient is in lithotomy position.  She is prepped with DuraPrep on the abdomen and with Betadine on the suprapubic vulvar and vaginal areas.  The Foley catheter is inserted in the bladder.  Timeout is done.  Patient has an extremely voluminous nodular uterus reaching the lower coastal margin bilaterally.  The uterus is very large but mobile.  The decision is made therefore to proceed with a wide lower abdominal horizontal incision.  The skin is marked with the surgical pen.  Experal is infiltrated at the incision site. Wide pfannenstiel incision with the scalpel.  The adipose tissue is opened with the Bovie cutting and coag modes.  The aponeurosis is opened transversely with the Bovie and Mayo scissors.  The aponeurosis is separated from the recti muscles on the midline superiorly and inferiorly.  The parietal peritoneum is open with Metzenbaums scissors longitudinally.  We then successfully exteriorized the very large fibroid uterus after extending the incision a few centimeters on each side.  Both tubes are normal in size and appearance.  Both ovaries are normal in size and appearance.  The cervix is long and narrow.  Pictures are taken of the uterus with fibroids.  The right tube is grasped with a Babcock and cauterized with the LigaSure and then sectioned.  We cauterized and sectioned the right round  ligament with the LigaSure and attached a Vicryl 0 on a hemostat on the pedicle.  We then cauterized and section the right utero-ovarian ligament with the LigaSure.  We started going down on the broad ligament close to the uterus.  We open the visceral peritoneum with Metzenbaums scissors to the midline and start lowering the bladder on the cervix.  We proceeded exactly the same way on the left side and further lowered the bladder all the way to the exocervix.  We then cauterized the backflow of the uterine artery high up at the lateral right and left side of the uterus.  We then clamped the right uterine artery with a straight Haney clamp, we section with Mayo scissors and suture with a Vicryl 0.  We proceeded the same way on the left side.  We then used the scalpel to section at the junction of the cervix and uterus above the sutured uterine arteries to completely detached the uterus.  The cervix is grasped with Kocher clamps anteriorly and posteriorly.  The very large uterus with fibroids and tubes are put in the pathology jar.  The Alexis retractor is put in place.  3 laps are used to keep the bowels out of the surgical field.  We then used straight Heaney's to clamp, then section with the scalpel and suture with Heaney stitch of Vicryl 0 until we reach the angle of the vagina on each side.  We used a curved Heaney on each angle of the vagina we section with Mayo scissors and suture with a Heaney stitch of Vicryl 0 on each side.  We keep the angles on curved hemostats.  We complete  the excision of the cervix by sectioning the upper vagina just below the cervix anteriorly and posteriorly with the large scissors.  The cervix is sent to pathology together with the uterus and tubes.  We then closed the vagina with figure-of-eight's with Vicryl 0.  The angle of the vagina is attached to the uterosacral ligament on its respective side.  We then verify hemostasis and completely with the Bovie coag mode where necessary.   The Alexis retractor is removed as well as the 3 laps.  We irrigate and suction the pelvic cavity.  Hemostasis is adequate at all levels.  Surgicel powder is added.  We let it sit for 1 to 2 minutes and then irrigate again and suction to remove the product.  Good hemostasis is confirmed at all levels.  The ureters were in normal anatomic position with good peristalsis.  Urine is clear.  We completed hemostasis with the Bovie coag mode at the recti muscles and the aponeurosis.  We closed the parietal peritoneum with a running suture of Vicryl 2-0.  We then closed the aponeurosis with 2 half running sutures of Vicryl 0.  The adipose tissue is reapproximated with separate stitches of plain 0.  The skin is closed with a subcuticular suture of Vicryl 4-0.  Dermabond is added.  The patient is brought to recovery room in good and stable status.  ESTIMATED BLOOD LOSS: 350 mL   Intake/Output Summary (Last 24 hours) at 08/06/2021 1259 Last data filed at 08/06/2021 1200 Gross per 24 hour  Intake 2000 ml  Output 1250 ml  Net 750 ml     BLOOD ADMINISTERED:none   LOCAL MEDICATIONS USED:  Exparel  SPECIMEN:  Source of Specimen:  Uterus with large fibroids, cervix and bilateral tubes  DISPOSITION OF SPECIMEN:  PATHOLOGY  COUNTS:  YES  PLAN OF CARE: Transfer to PACU  Marie-Lyne LavoieMD12:59 PM

## 2021-08-06 NOTE — Transfer of Care (Signed)
Immediate Anesthesia Transfer of Care Note  Patient: Kristina Johnston  Procedure(s) Performed: HYSTERECTOMY ABDOMINAL WITH BILATERAL SALPINGECTOMY (Bilateral: Abdomen)  Patient Location: PACU  Anesthesia Type:General  Level of Consciousness: drowsy  Airway & Oxygen Therapy: Patient Spontanous Breathing and Patient connected to nasal cannula oxygen  Post-op Assessment: Report given to RN and Post -op Vital signs reviewed and stable  Post vital signs: Reviewed and stable  Last Vitals:  Vitals Value Taken Time  BP 111/73 08/06/21 1203  Temp    Pulse 87 08/06/21 1207  Resp 18 08/06/21 1207  SpO2 96 % 08/06/21 1207  Vitals shown include unvalidated device data.  Last Pain:  Vitals:   08/06/21 0655  PainSc: 0-No pain      Patients Stated Pain Goal: 0 (11/07/46 6282)  Complications: No notable events documented.

## 2021-08-06 NOTE — Anesthesia Preprocedure Evaluation (Addendum)
Anesthesia Evaluation  Patient identified by MRN, date of birth, ID band Patient awake    Reviewed: Allergy & Precautions, NPO status , Patient's Chart, lab work & pertinent test results  History of Anesthesia Complications Negative for: history of anesthetic complications  Airway Mallampati: I  TM Distance: >3 FB Neck ROM: Full    Dental  (+) Dental Advisory Given, Teeth Intact   Pulmonary neg pulmonary ROS,    breath sounds clear to auscultation       Cardiovascular hypertension, Pt. on medications (-) angina Rhythm:Regular Rate:Normal     Neuro/Psych Anxiety Depression Bipolar Disorder negative neurological ROS     GI/Hepatic negative GI ROS, Neg liver ROS,   Endo/Other  negative endocrine ROS  Renal/GU negative Renal ROS     Musculoskeletal   Abdominal   Peds  Hematology negative hematology ROS (+)   Anesthesia Other Findings   Reproductive/Obstetrics                            Anesthesia Physical Anesthesia Plan  ASA: 2  Anesthesia Plan: General   Post-op Pain Management: Tylenol PO (pre-op)*   Induction: Intravenous  PONV Risk Score and Plan: 3 and Ondansetron, Dexamethasone and Scopolamine patch - Pre-op  Airway Management Planned: Oral ETT  Additional Equipment: None  Intra-op Plan:   Post-operative Plan: Extubation in OR  Informed Consent: I have reviewed the patients History and Physical, chart, labs and discussed the procedure including the risks, benefits and alternatives for the proposed anesthesia with the patient or authorized representative who has indicated his/her understanding and acceptance.     Dental advisory given  Plan Discussed with: CRNA and Surgeon  Anesthesia Plan Comments:        Anesthesia Quick Evaluation

## 2021-08-07 ENCOUNTER — Encounter (HOSPITAL_COMMUNITY): Payer: Self-pay | Admitting: Obstetrics & Gynecology

## 2021-08-07 LAB — CBC
HCT: 36.1 % (ref 36.0–46.0)
Hemoglobin: 12.7 g/dL (ref 12.0–15.0)
MCH: 31.1 pg (ref 26.0–34.0)
MCHC: 35.2 g/dL (ref 30.0–36.0)
MCV: 88.5 fL (ref 80.0–100.0)
Platelets: 207 10*3/uL (ref 150–400)
RBC: 4.08 MIL/uL (ref 3.87–5.11)
RDW: 13.2 % (ref 11.5–15.5)
WBC: 6.5 10*3/uL (ref 4.0–10.5)
nRBC: 0 % (ref 0.0–0.2)

## 2021-08-07 LAB — SURGICAL PATHOLOGY

## 2021-08-07 MED ORDER — OXYCODONE-ACETAMINOPHEN 7.5-325 MG PO TABS: 1.0000 | ORAL_TABLET | Freq: Four times a day (QID) | ORAL | 0 refills | Status: DC | PRN

## 2021-08-07 NOTE — Plan of Care (Signed)

## 2021-08-07 NOTE — Progress Notes (Signed)
TAH/BS POD#1  Subjective: Patient reports tolerating PO, + flatus, and no problems voiding.    Objective: I have reviewed patient's vital signs.  vital signs, intake and output, medications, and labs.  Vitals:   08/07/21 0311 08/07/21 0755  BP: 122/63 121/74  Pulse: 86 82  Resp: 18 18  Temp: 98.6 F (37 C) 98.3 F (36.8 C)  SpO2: 94% 97%   I/O last 3 completed shifts: In: 2360 [P.O.:360; I.V.:2000] Out: 2950 [Urine:2600; Blood:350] Total I/O In: 480 [P.O.:480] Out: 0   Results for orders placed or performed during the hospital encounter of 08/06/21 (from the past 24 hour(s))  CBC     Status: None   Collection Time: 08/07/21  4:25 AM  Result Value Ref Range   WBC 6.5 4.0 - 10.5 K/uL   RBC 4.08 3.87 - 5.11 MIL/uL   Hemoglobin 12.7 12.0 - 15.0 g/dL   HCT 36.1 36.0 - 46.0 %   MCV 88.5 80.0 - 100.0 fL   MCH 31.1 26.0 - 34.0 pg   MCHC 35.2 30.0 - 36.0 g/dL   RDW 13.2 11.5 - 15.5 %   Platelets 207 150 - 400 K/uL   nRBC 0.0 0.0 - 0.2 %    EXAM General: alert, cooperative, and appears stated age Resp: clear to auscultation bilaterally Cardio: regular rate and rhythm GI: normal findings: BS present, no distention.  Incision intact and dry dressing. Extremities: Homans sign is negative, no sign of DVT Vaginal Bleeding: none  Assessment: s/p Procedure(s): HYSTERECTOMY ABDOMINAL WITH BILATERAL SALPINGECTOMY: stable, progressing well, and tolerating diet  Plan: Advance diet Encourage ambulation Discontinue IV fluids Discharge home  LOS: 1 day    [name], MD 08/07/2021 8:34 AM

## 2021-08-08 ENCOUNTER — Other Ambulatory Visit: Payer: Self-pay | Admitting: *Deleted

## 2021-08-08 NOTE — Patient Outreach (Signed)
  Care Coordination [facility] Note Transition Care Management Follow-up Telephone Call Date of discharge and from where: [phone] [facility] How have you been since you were released from the hospital? Fine just some gas Any questions or concerns? N  Items Reviewed: Did the pt receive and understand the discharge instructions provided? Yes  Medications obtained and verified? Yes  Other? No  Any new allergies since your discharge? No  Dietary orders reviewed? No Do you have support at home? Yes   Home Care and Equipment/Supplies: Were home health services ordered? no If so, what is the name of the agency? N   Has the agency set up a time to come to the patient's home? not applicable Were any new equipment or medical supplies ordered?  No What is the name of the medical supply agency? n Were you able to get the supplies/equipment? not applicable Do you have any questions related to the use of the equipment or supplies? No  Functional Questionnaire: (I = Independent and D = Dependent) ADLs: I  Bathing/Dressing- I  Meal Prep- I  Eating- I  Maintaining continence- I  Transferring/Ambulation- I  Managing Meds- I  Follow up appointments reviewed:  PCP Hospital f/u appt confirmed? No   Specialist Hospital f/u appt confirmed? Yes  Scheduled to see Dr Chalmers Cater August 4, 8:30  Are transportation arrangements needed? N If their condition worsens, is the pt aware to call PCP or go to the Emergency Dept.? Yes Was the patient provided with contact information for the PCP's office or ED? Yes Was to pt encouraged to call back with questions or concerns? Yes  SDOH assessments and interventions completed:   Yes  Care Coordination Interventions Activated:  Yes Care Coordination Interventions:   N/a  Encounter Outcome:  Pt. Visit Completed

## 2021-08-08 NOTE — Patient Outreach (Signed)
  Care Coordination [facility] Note Transition Care Management Unsuccessful Follow-up Telephone Call  Date of discharge and from where:  [phone]  Attempts:  1st Attempt  Reason for unsuccessful TCM follow-up call:  Left voice message   [place] Care Management [phone]

## 2021-08-09 NOTE — Discharge Summary (Signed)
Physician Discharge Summary  Patient ID: Kristina Johnston MRN: [id number] DOB/AGE: [birth year] 51 y.o.  Admit date: 08/06/2021 Discharge date: 08/09/2021  Admission Diagnoses: Postoperative state [Z98.890] Uterine fibroid [D25.9]   Discharge Diagnoses:  Principal Problem:   Postoperative state Active Problems:   Uterine fibroid   Discharged Condition: good  Consults:None  Significant Diagnostic Studies: labs: Postop Hb 12.7, WBC 6.5.  Treatments:surgery: Total Abdominal Hysterectomy with Bilateral Salpingectomy  Vitals:   08/07/21 0311 08/07/21 0755  BP: 122/63 121/74  Pulse: 86 82  Resp: 18 18  Temp: 98.6 F (37 C) 98.3 F (36.8 C)  SpO2: 94% 97%     No intake/output data recorded.   Hospital Course: Good.  Discharge Exam: Normal Postop  Disposition: Discharge disposition: 01-Home or Self Care       Discharge Instructions     Discharge patient home if stable and all samples have been received and logged by the lab   Complete by: As directed         Allergies as of 08/07/2021   No Known Allergies      Medication List     STOP taking these medications    megestrol 40 MG tablet Commonly known as: MEGACE       TAKE these medications    amLODipine 10 MG tablet Commonly known as: NORVASC TAKE 1 TABLET(10 MG) BY MOUTH DAILY   ferrous sulfate 220 (44 Fe) MG/5ML solution Take 5 mLs (220 mg total) by mouth 2 (two) times daily.   Flaxseed Oil 1000 MG Caps Take 1,000 mg by mouth in the morning and at bedtime.   haloperidol 2 MG/ML solution Commonly known as: HALDOL Take 4 mg by mouth at bedtime.   loratadine 10 MG tablet Commonly known as: CLARITIN Take 10 mg by mouth daily.   MULTIVITAMIN PO Take 1 tablet by mouth daily at 6 (six) AM.   oxyCODONE-acetaminophen 7.5-325 MG tablet Commonly known as: Percocet Take 1 tablet by mouth every 6 (six) hours as needed for severe pain.   Potassium 99 MG Tabs Take 99 mg by mouth daily.    triamcinolone cream 0.1 % Commonly known as: KENALOG APPLY TO SKIN TWICE A DAY FOR NO MORE THAN 2 WEEKS What changed:  how much to take how to take this when to take this   Valproate Sodium 250 MG/5ML Soln solution Commonly known as: DEPAKENE Take 1,000 mg by mouth at bedtime.   VITAMIN C PO Take 1,000 mg by mouth daily.          Follow-up Information     [name], MD Follow up in 2 week(s).   Specialty: Obstetrics and Gynecology Contact information: [place] [address] [phone]                  Signed: [name] 08/09/2021, 5:08 PM

## 2021-08-11 LAB — TYPE AND SCREEN
ABO/RH(D): AB POS
Antibody Screen: POSITIVE
Unit division: 0
Unit division: 0

## 2021-08-11 LAB — BPAM RBC
Blood Product Expiration Date: 202308102359
Blood Product Expiration Date: 202308102359
Unit Type and Rh: 6200
Unit Type and Rh: 6200

## 2021-08-12 NOTE — Telephone Encounter (Signed)
Call placed to f/u with patient post-op.  Patient reports she is doing well. Some challenges getting in and out of bed, but overall doing well. States she is ambulating frequently  and eating well.   States she reviewed home health option with insurance provider, not covered by plan, has decided she does not need this at this time. States she has family support helping and checking in. Will see Dr. Dellis Filbert on 8/4. Patient is aware to call if any additional assistance needed or if any additional questions.   Routing to Dr. Ileene Musa.   Encounter closed.

## 2021-08-12 NOTE — Telephone Encounter (Signed)
Left message to call Shalonda Sachse, RN at [facility], [phone], OPT 5.  

## 2021-08-13 ENCOUNTER — Other Ambulatory Visit: Payer: Self-pay

## 2021-08-13 ENCOUNTER — Telehealth: Payer: Self-pay | Admitting: *Deleted

## 2021-08-13 NOTE — Telephone Encounter (Signed)
Dr.Lavoie replied  "Agree with Tylenol liquid 650 mg PO every 6 hours as needed"   Dr.Lavoie I can't find tylenol liquid 650 mg dose in epic.

## 2021-08-13 NOTE — Telephone Encounter (Signed)
Patient call post TAH/BS on 08/06/21, called today stating she took her last percocet this am. She is doing well,has post op scheduled on 08/16/21. She called asking if you would prescribed liquid tylenol? She reports chewing pills,but can do this for small pills. Reports pain is not bad, but would like to have have liquid tylenol for pain now percocet is gone. Rx can be sent to [facility] listed in chart.  Please advise.

## 2021-08-14 NOTE — Telephone Encounter (Signed)
Dr.Lavoie replied " Can send Ibuprofen 600 mg PO every 6 hours.  #30, refill x 2     I informed patient. Patient said she will try OTC Childrens tylenol.

## 2021-08-16 ENCOUNTER — Encounter: Payer: Self-pay | Admitting: Obstetrics & Gynecology

## 2021-08-16 ENCOUNTER — Ambulatory Visit (INDEPENDENT_AMBULATORY_CARE_PROVIDER_SITE_OTHER): Payer: Medicare Other | Admitting: Obstetrics & Gynecology

## 2021-08-16 VITALS — BP 110/72 | HR 95

## 2021-08-16 DIAGNOSIS — Z09 Encounter for follow-up examination after completed treatment for conditions other than malignant neoplasm: Secondary | ICD-10-CM

## 2021-08-16 NOTE — Progress Notes (Signed)
    Kristina Johnston [birth year] [id number]        51 y.o.  G1P1001   RP: Postop TAH/BS on 08/06/21  HPI: Doing very well with no need for any pain medication at this time.  Incision well closed, dry and not painful.  No vaginal bleeding or discharge.  Urine/BMs normal.  No fever.   OB History  Gravida Para Term Preterm AB Living  '1 1 1     1  '$ SAB IAB Ectopic Multiple Live Births               # Outcome Date GA Lbr Len/2nd Weight Sex Delivery Anes PTL Lv  1 Term             Past medical history,surgical history, problem list, medications, allergies, family history and social history were all reviewed and documented in the EPIC chart.   Directed ROS with pertinent positives and negatives documented in the history of present illness/assessment and plan.  Exam:  Vitals:   08/16/21 0829  BP: 110/72  Pulse: 95  SpO2: 97%   General appearance:  Normal  Abdomen: Incision well closed, no erythema, no induration, no discharge.  Soft, NT, not distended.  Gynecologic exam: Vulva normal.  Speculum:  Vaginal vault well closed.  No discharge, no blood.   Assessment/Plan:  51 y.o. G1P1001   1. Status post gynecological surgery, follow-up exam  Doing very well with no need for any pain medication at this time.  Incision well closed, dry and not painful.  No vaginal bleeding or discharge.  Urine/BMs normal.  No fever.  Normal postop exam.  Excellent postop healing with no complication.  Precautions reviewed, no intercourse until f/u.  F/U to check vaginal vault in 4 weeks.  [name] MD, 9:05 AM 08/16/2021

## 2021-09-13 ENCOUNTER — Encounter: Payer: Self-pay | Admitting: Obstetrics & Gynecology

## 2021-09-13 ENCOUNTER — Ambulatory Visit (INDEPENDENT_AMBULATORY_CARE_PROVIDER_SITE_OTHER): Payer: Medicare Other | Admitting: Obstetrics & Gynecology

## 2021-09-13 VITALS — BP 120/76 | HR 74

## 2021-09-13 DIAGNOSIS — Z09 Encounter for follow-up examination after completed treatment for conditions other than malignant neoplasm: Secondary | ICD-10-CM

## 2021-09-13 NOTE — Progress Notes (Signed)
    Jaelle Campanile [birth year] [id number]        51 y.o. G1P1L1    RP: Postop TAH/BS on 08/06/21   HPI: Doing very well with no need for any pain medication at this time, no abdominopelvic pain.  Incision well closed, dry and not painful.  No vaginal bleeding or discharge.  Urine/BMs normal.  No fever.   OB History  Gravida Para Term Preterm AB Living  '1 1 1     1  '$ SAB IAB Ectopic Multiple Live Births               # Outcome Date GA Lbr Len/2nd Weight Sex Delivery Anes PTL Lv  1 Term             Past medical history,surgical history, problem list, medications, allergies, family history and social history were all reviewed and documented in the EPIC chart.   Directed ROS with pertinent positives and negatives documented in the history of present illness/assessment and plan.  Exam:  Vitals:   09/13/21 0859  BP: 120/76  Pulse: 74   General appearance:  Normal  Abdomen: Incision completely healed, well closed.  No erythema or induration, no discharge.  Gynecologic exam:  Vulva normal.  Bimanual exam:  Vaginal vault well closed.  No induration.  No pelvic mass/cyst.  Non-tender.  No blood or discharge.                  Patho 08/06/21: FINAL MICROSCOPIC DIAGNOSIS:   A. UTERUS AND CERVIX, WITH BILATERAL FALLOPIAN TUBES, HYSTERECTOMY:  - Cervix      Nabothian cysts.      Negative for dysplasia.  - Endometrium      Inactive.      Negative for atypia or malignancy.  - Myometrium      Leiomyomata with degenerative changes.      Negative for malignancy.  - Bilateral fallopian tubes      Unremarkable.      Negative for endometriosis or malignancy.    Assessment/Plan:  51 y.o. G1P1001   1. Status post gynecological surgery, follow-up exam  Doing very well with no need for any pain medication at this time, no abdominopelvic pain.  Incision well closed, dry and not painful.  No vaginal bleeding or discharge.  Urine/BMs normal.  No fever.  Completely healed, no Cx.  Patho  benign.  Patient reassured.  Can resume all physical and sexual activities, no restriction.  F/U Annual Gyn exam in a year.  [name] MD, 9:10 AM 09/13/2021

## 2021-11-04 ENCOUNTER — Ambulatory Visit (INDEPENDENT_AMBULATORY_CARE_PROVIDER_SITE_OTHER): Payer: Medicare Other | Admitting: Emergency Medicine

## 2021-11-04 ENCOUNTER — Encounter: Payer: Self-pay | Admitting: Emergency Medicine

## 2021-11-04 VITALS — BP 118/82 | HR 81 | Temp 98.4°F | Ht 65.0 in | Wt 187.0 lb

## 2021-11-04 DIAGNOSIS — Z1211 Encounter for screening for malignant neoplasm of colon: Secondary | ICD-10-CM

## 2021-11-04 DIAGNOSIS — Z1231 Encounter for screening mammogram for malignant neoplasm of breast: Secondary | ICD-10-CM | POA: Diagnosis not present

## 2021-11-04 DIAGNOSIS — F3176 Bipolar disorder, in full remission, most recent episode depressed: Secondary | ICD-10-CM

## 2021-11-04 DIAGNOSIS — D259 Leiomyoma of uterus, unspecified: Secondary | ICD-10-CM

## 2021-11-04 DIAGNOSIS — I10 Essential (primary) hypertension: Secondary | ICD-10-CM

## 2021-11-04 LAB — LIPID PANEL
Cholesterol: 200 mg/dL (ref 0–200)
HDL: 67.6 mg/dL (ref >39.00)
LDL Cholesterol: 106 mg/dL — ABNORMAL HIGH (ref 0–99)
NonHDL: 132.16
Total CHOL/HDL Ratio: 3
Triglycerides: 129 mg/dL (ref 0.0–149.0)
VLDL: 25.8 mg/dL (ref 0.0–40.0)

## 2021-11-04 LAB — COMPREHENSIVE METABOLIC PANEL
ALT: 19 U/L (ref 0–35)
AST: 19 U/L (ref 0–37)
Albumin: 4.7 g/dL (ref 3.5–5.2)
Alkaline Phosphatase: 72 U/L (ref 39–117)
BUN: 18 mg/dL (ref 6–23)
CO2: 29 mEq/L (ref 19–32)
Calcium: 10.3 mg/dL (ref 8.4–10.5)
Chloride: 104 mEq/L (ref 96–112)
Creatinine, Ser: 0.77 mg/dL (ref 0.40–1.20)
GFR: 88.37 mL/min (ref >60.00)
Glucose, Bld: 95 mg/dL (ref 70–99)
Potassium: 3.8 mEq/L (ref 3.5–5.1)
Sodium: 142 mEq/L (ref 135–145)
Total Bilirubin: 0.4 mg/dL (ref 0.2–1.2)
Total Protein: 8.1 g/dL (ref 6.0–8.3)

## 2021-11-04 LAB — CBC WITH DIFFERENTIAL/PLATELET
Basophils Absolute: 0 10*3/uL (ref 0.0–0.1)
Basophils Relative: 1 % (ref 0.0–3.0)
Eosinophils Absolute: 0.2 10*3/uL (ref 0.0–0.7)
Eosinophils Relative: 3 % (ref 0.0–5.0)
HCT: 42.1 % (ref 36.0–46.0)
Hemoglobin: 13.9 g/dL (ref 12.0–15.0)
Lymphocytes Relative: 37.2 % (ref 12.0–46.0)
Lymphs Abs: 1.9 10*3/uL (ref 0.7–4.0)
MCHC: 32.9 g/dL (ref 30.0–36.0)
MCV: 90.1 fl (ref 78.0–100.0)
Monocytes Absolute: 0.3 10*3/uL (ref 0.1–1.0)
Monocytes Relative: 6.7 % (ref 3.0–12.0)
Neutro Abs: 2.7 10*3/uL (ref 1.4–7.7)
Neutrophils Relative %: 52.1 % (ref 43.0–77.0)
Platelets: 272 10*3/uL (ref 150.0–400.0)
RBC: 4.68 Mil/uL (ref 3.87–5.11)
RDW: 13.7 % (ref 11.5–15.5)
WBC: 5.1 10*3/uL (ref 4.0–10.5)

## 2021-11-04 NOTE — Patient Instructions (Signed)

## 2021-11-04 NOTE — Progress Notes (Signed)
Kristina Johnston 51 y.o.   Chief Complaint  Patient presents with   Follow-up    87mth f/u appt, no concerns    HISTORY OF PRESENT ILLNESS: This is a 51y.o. female here for 67-monthollow-up. History of hypertension Had fibroid surgery done earlier this year with success. Has no complaints or medical concerns today.  HPI   Prior to Admission medications   Medication Sig Start Date End Date Taking? Authorizing Provider  amLODipine (NORVASC) 10 MG tablet TAKE 1 TABLET(10 MG) BY MOUTH DAILY 06/15/21  Yes Murray Durrell, MiInes BloomerMD  Ascorbic Acid (VITAMIN C PO) Take 1,000 mg by mouth daily.   Yes [provider]  ferrous sulfate 220 (44 Fe) MG/5ML solution Take 5 mLs (220 mg total) by mouth 2 (two) times daily. 04/17/21  Yes Holbert Caples, MiInes BloomerMD  Flaxseed, Linseed, (FLAXSEED OIL) 1000 MG CAPS Take 1,000 mg by mouth in the morning and at bedtime.   Yes [provider]  haloperidol (HALDOL) 2 MG/ML solution Take 4 mg by mouth at bedtime. 08/25/17  Yes [provider]  loratadine (CLARITIN) 10 MG tablet Take 10 mg by mouth daily.   Yes [provider]  Multiple Vitamin (MULTIVITAMIN PO) Take 1 tablet by mouth daily at 6 (six) AM.   Yes [provider]  Potassium 99 MG TABS Take 99 mg by mouth daily.   Yes [provider]  triamcinolone (KENALOG) 0.1 % APPLY TO SKIN TWICE A DAY FOR NO MORE THAN 2 WEEKS Patient taking differently: Apply 1 Application topically 2 (two) times daily. APPLY TO SKIN TWICE A DAY FOR NO MORE THAN 2 WEEKS 11/29/19  Yes Jamecia Lerman, MiInes BloomerMD  Valproate Sodium (DEPAKENE) 250 MG/5ML SOLN solution Take 1,000 mg by mouth at bedtime.   Yes [provider]    No Known Allergies  Patient Active Problem List   Diagnosis Date Noted   Postoperative state 08/06/2021   Uterine fibroid 08/06/2021   Fibroid tumor 04/23/2020   Essential hypertension 06/06/2017   Bipolar disorder ([facility]03/27/2019   Depression  03/17/2011    Past Medical History:  Diagnosis Date   Anemia    hx of   Anxiety    Bipolar disorder ([facility]   Depression    Hypertension    Paranoid schizophrenia ([facility]    Past Surgical History:  Procedure Laterality Date   DILATION AND CURETTAGE OF UTERUS  1995   pt had abortion and believes she had to have a D&E or D&C   HYSTERECTOMY ABDOMINAL WITH SALPINGECTOMY Bilateral 08/06/2021   Procedure: HYSTERECTOMY ABDOMINAL WITH BILATERAL SALPINGECTOMY;  Surgeon: LaPrincess BruinsMD;  [place] [facility];  Laterality: Bilateral;   WISDOM TOOTH EXTRACTION      Social History   Socioeconomic History   Marital status: Divorced    Spouse name: Not on file   Number of children: 1   Years of education: Not on file   Highest education level: Not on file  Occupational History   Not on file  Tobacco Use   Smoking status: Never   Smokeless tobacco: Never  Vaping Use   Vaping Use: Never used  Substance and Sexual Activity   Alcohol use: No   Drug use: No   Sexual activity: Not Currently    Partners: Male    Birth control/protection: Surgical    Comment: 1st intercourse-19, partners- 4,64hysterectomy  Other Topics Concern   Not on file  Social History Narrative  Not on file   Social Determinants of Health   Financial Resource Strain: Low Risk  (04/08/2017)   Overall Financial Resource Strain (CARDIA)    Difficulty of Paying Living Expenses: Not hard at all  Food Insecurity: No Food Insecurity (06/13/2021)   Hunger Vital Sign    Worried About Running Out of Food in the Last Year: Never true    Ran Out of Food in the Last Year: Never true  Transportation Needs: No Transportation Needs (06/13/2021)   PRAPARE - Hydrologist (Medical): No    Lack of Transportation (Non-Medical): No  Physical Activity: Insufficiently Active (06/13/2021)   Exercise Vital Sign    Days of Exercise per Week: 2 days    Minutes of Exercise per Session: 20  min  Stress: Stress Concern Present (06/13/2021)   [place]    Feeling of Stress : To some extent  Social Connections: Moderately Isolated (06/13/2021)   Social Connection and Isolation Panel [NHANES]    Frequency of Communication with Friends and Family: Three times a week    Frequency of Social Gatherings with Friends and Family: Three times a week    Attends Religious Services: More than 4 times per year    Active Member of Clubs or Organizations: No    Attends Archivist Meetings: Never    Marital Status: Divorced  Human resources officer Violence: Not At Risk (06/13/2021)   Humiliation, Afraid, Rape, and Kick questionnaire    Fear of Current or Ex-Partner: No    Emotionally Abused: No    Physically Abused: No    Sexually Abused: No    Family History  Problem Relation Age of Onset   Heart disease Mother    Hyperlipidemia Mother    Mental illness Mother    Diabetes Father    Hypertension Sister    Hyperlipidemia Brother    Hypertension Brother    Cancer Maternal Grandmother    Cancer Maternal Grandfather    Hyperlipidemia Maternal Grandfather    Hypertension Maternal Grandfather    Breast cancer Maternal Grandfather    Cancer Paternal Grandmother 36       breast cancer   Hyperlipidemia Paternal Grandfather    Hypertension Paternal Grandfather    Cancer Maternal Aunt 79       breast cancer   Breast cancer Maternal Aunt    Breast cancer Cousin      Review of Systems  Constitutional: Negative.  Negative for chills and fever.  HENT: Negative.  Negative for congestion and sore throat.   Respiratory: Negative.  Negative for cough and shortness of breath.   Cardiovascular: Negative.  Negative for chest pain and palpitations.  Gastrointestinal:  Negative for abdominal pain, diarrhea, nausea and vomiting.  Genitourinary: Negative.  Negative for dysuria and hematuria.  Skin: Negative.  Negative for rash.   Neurological: Negative.  Negative for dizziness and headaches.  All other systems reviewed and are negative.   Today's Vitals   11/04/21 0958  BP: 118/82  Pulse: 81  Temp: 98.4 F (36.9 C)  TempSrc: Oral  SpO2: 97%  Weight: 187 lb (84.8 kg)  Height: '5\' 5"'$  (1.651 m)   Body mass index is 31.12 kg/m. Wt Readings from Last 3 Encounters:  11/04/21 187 lb (84.8 kg)  08/06/21 179 lb (81.2 kg)  07/31/21 183 lb 8 oz (83.2 kg)    Physical Exam Vitals reviewed.  Constitutional:      Appearance:  Normal appearance.  HENT:     Head: Normocephalic.  Eyes:     Extraocular Movements: Extraocular movements intact.     Pupils: Pupils are equal, round, and reactive to light.  Cardiovascular:     Rate and Rhythm: Normal rate and regular rhythm.     Pulses: Normal pulses.     Heart sounds: Normal heart sounds.  Pulmonary:     Effort: Pulmonary effort is normal.     Breath sounds: Normal breath sounds.  Musculoskeletal:     Right lower leg: No edema.     Left lower leg: No edema.  Skin:    General: Skin is warm and dry.  Neurological:     General: No focal deficit present.     Mental Status: She is alert and oriented to person, place, and time.  Psychiatric:        Mood and Affect: Mood normal.        Behavior: Behavior normal.      ASSESSMENT & PLAN: A total of 44 minutes was spent with the patient and counseling/coordination of care regarding preparing for this visit, review of most recent office visit notes, review of multiple chronic medical problems under management, review of all medications, review of most recent blood work results, cardiovascular risks associated with hypertension, education on nutrition, prognosis, documentation and need for follow-up.  Problem List Items Addressed This Visit       Cardiovascular and Mediastinum   Essential hypertension - Primary    Well-controlled hypertension. Continue amlodipine 10 mg daily. BP Readings from Last 3 Encounters:   11/04/21 118/82  09/13/21 120/76  08/16/21 110/72         Relevant Orders   CBC with Differential/Platelet   Comprehensive metabolic panel   Lipid panel     Genitourinary   Uterine fibroid    Successful surgery.  No complications.        Other   Bipolar disorder ([place])    Stable.  Sees psychiatrist on a regular basis.      Other Visit Diagnoses     Colon cancer screening       Relevant Orders   Ambulatory referral to Gastroenterology   Breast cancer screening by mammogram       Relevant Orders   MM Digital Screening      Patient Instructions  Health Maintenance, Female Adopting a healthy lifestyle and getting preventive care are important in promoting health and wellness. Ask your health care provider about: The right schedule for you to have regular tests and exams. Things you can do on your own to prevent diseases and keep yourself healthy. What should I know about diet, weight, and exercise? Eat a healthy diet  Eat a diet that includes plenty of vegetables, fruits, low-fat dairy products, and lean protein. Do not eat a lot of foods that are high in solid fats, added sugars, or sodium. Maintain a healthy weight Body mass index (BMI) is used to identify weight problems. It estimates body fat based on height and weight. Your health care provider can help determine your BMI and help you achieve or maintain a healthy weight. Get regular exercise Get regular exercise. This is one of the most important things you can do for your health. Most adults should: Exercise for at least 150 minutes each week. The exercise should increase your heart rate and make you sweat (moderate-intensity exercise). Do strengthening exercises at least twice a week. This is in addition to the moderate-intensity exercise. Spend  less time sitting. Even light physical activity can be beneficial. Watch cholesterol and blood lipids Have your blood tested for lipids and cholesterol at 51 years of  age, then have this test every 5 years. Have your cholesterol levels checked more often if: Your lipid or cholesterol levels are high. You are older than 53 years of age. You are at high risk for heart disease. What should I know about cancer screening? Depending on your health history and family history, you may need to have cancer screening at various ages. This may include screening for: Breast cancer. Cervical cancer. Colorectal cancer. Skin cancer. Lung cancer. What should I know about heart disease, diabetes, and high blood pressure? Blood pressure and heart disease High blood pressure causes heart disease and increases the risk of stroke. This is more likely to develop in people who have high blood pressure readings or are overweight. Have your blood pressure checked: Every 3-5 years if you are 71-21 years of age. Every year if you are 73 years old or older. Diabetes Have regular diabetes screenings. This checks your fasting blood sugar level. Have the screening done: Once every three years after age 15 if you are at a normal weight and have a low risk for diabetes. More often and at a younger age if you are overweight or have a high risk for diabetes. What should I know about preventing infection? Hepatitis B If you have a higher risk for hepatitis B, you should be screened for this virus. Talk with your health care provider to find out if you are at risk for hepatitis B infection. Hepatitis C Testing is recommended for: Everyone born from [birth year] through 1967. Anyone with known risk factors for hepatitis C. Sexually transmitted infections (STIs) Get screened for STIs, including gonorrhea and chlamydia, if: You are sexually active and are younger than 53 years of age. You are older than 53 years of age and your health care provider tells you that you are at risk for this type of infection. Your sexual activity has changed since you were last screened, and you are at increased  risk for chlamydia or gonorrhea. Ask your health care provider if you are at risk. Ask your health care provider about whether you are at high risk for HIV. Your health care provider may recommend a prescription medicine to help prevent HIV infection. If you choose to take medicine to prevent HIV, you should first get tested for HIV. You should then be tested every 3 months for as long as you are taking the medicine. Pregnancy If you are about to stop having your period (premenopausal) and you may become pregnant, seek counseling before you get pregnant. Take 400 to 800 micrograms (mcg) of folic acid every day if you become pregnant. Ask for birth control (contraception) if you want to prevent pregnancy. Osteoporosis and menopause Osteoporosis is a disease in which the bones lose minerals and strength with aging. This can result in bone fractures. If you are 5 years old or older, or if you are at risk for osteoporosis and fractures, ask your health care provider if you should: Be screened for bone loss. Take a calcium or vitamin D supplement to lower your risk of fractures. Be given hormone replacement therapy (HRT) to treat symptoms of menopause. Follow these instructions at home: Alcohol use Do not drink alcohol if: Your health care provider tells you not to drink. You are pregnant, may be pregnant, or are planning to become pregnant. If you drink  alcohol: Limit how much you have to: 0-1 drink a day. Know how much alcohol is in your drink. In the [place] one drink equals one 12 oz bottle of beer (355 mL), one 5 oz glass of wine (148 mL), or one 1 oz glass of hard liquor (44 mL). Lifestyle Do not use any products that contain nicotine or tobacco. These products include cigarettes, chewing tobacco, and vaping devices, such as e-cigarettes. If you need help quitting, ask your health care provider. Do not use street drugs. Do not share needles. Ask your health care provider for help if you need  support or information about quitting drugs. General instructions Schedule regular health, dental, and eye exams. Stay current with your vaccines. Tell your health care provider if: You often feel depressed. You have ever been abused or do not feel safe at home. Summary Adopting a healthy lifestyle and getting preventive care are important in promoting health and wellness. Follow your health care provider's instructions about healthy diet, exercising, and getting tested or screened for diseases. Follow your health care provider's instructions on monitoring your cholesterol and blood pressure. This information is not intended to replace advice given to you by your health care provider. Make sure you discuss any questions you have with your health care provider. Document Revised: 05/21/2020 Document Reviewed: 05/21/2020 Elsevier Patient Education  [place], MD [facility]

## 2021-11-04 NOTE — Assessment & Plan Note (Signed)
Stable.  Sees psychiatrist on a regular basis. 

## 2021-11-04 NOTE — Assessment & Plan Note (Signed)
Successful surgery.  No complications.

## 2021-11-04 NOTE — Assessment & Plan Note (Signed)
Well-controlled hypertension. Continue amlodipine 10 mg daily. BP Readings from Last 3 Encounters:  11/04/21 118/82  09/13/21 120/76  08/16/21 110/72

## 2021-12-28 IMAGING — CT CT ABD-PELV W/ CM
2 of 5 series · 16 of 46 positions shown, 18 images · IV contrast (OMNIPAQUE 300)
Comparison: None.

CLINICAL DATA: Abdominal pain and known history of uterine
fibroids.

EXAM:
CT ABDOMEN AND PELVIS WITH CONTRAST
TECHNIQUE: Multidetector CT imaging of the abdomen and pelvis was performed
using the standard protocol following bolus administration of
intravenous contrast.
CONTRAST:  100mL OMNIPAQUE IOHEXOL 350 MG/ML SOLN

[Series 2: axial st · axial · 0.76mm/px · z∈[+1115,+1515]mm · 13 of 94 slices shown, 15 images]
[im 7/94  soft-tissue]
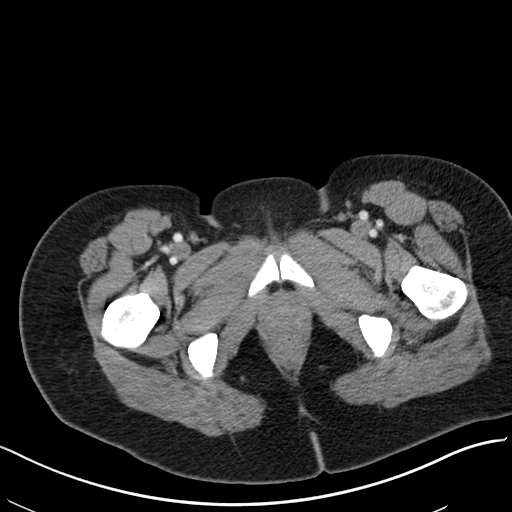
[im 7/94  bone]
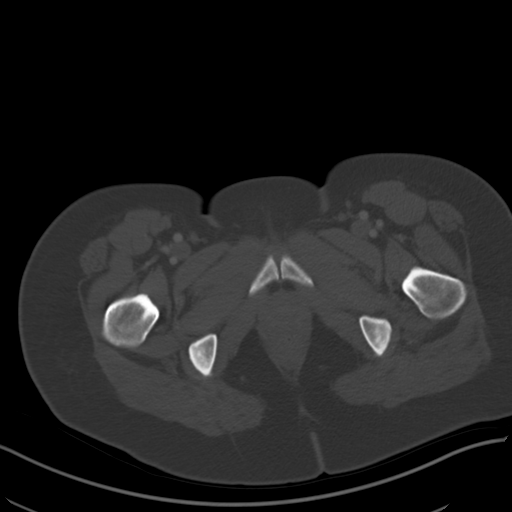
[im 14/94  soft-tissue]
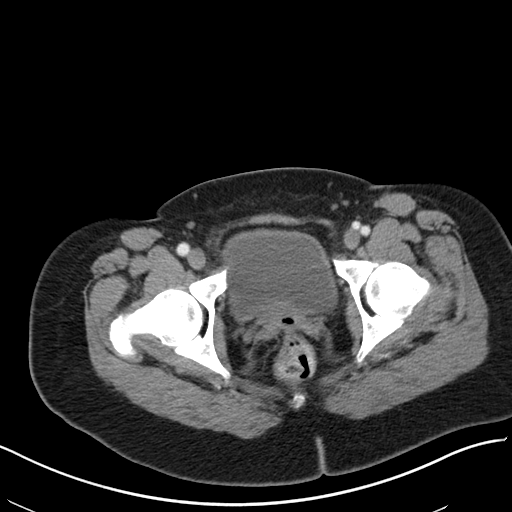
[im 20/94  soft-tissue]
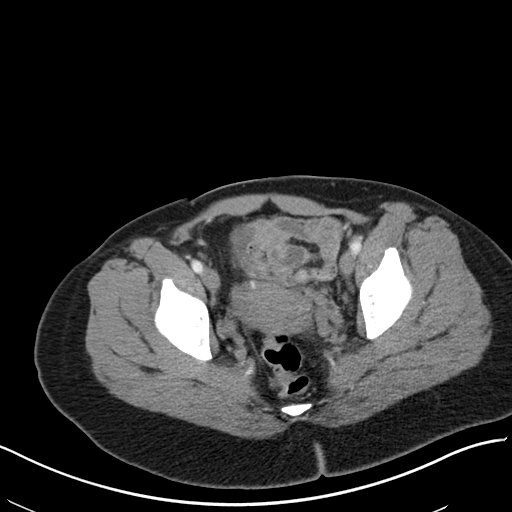
[im 27/94  soft-tissue]
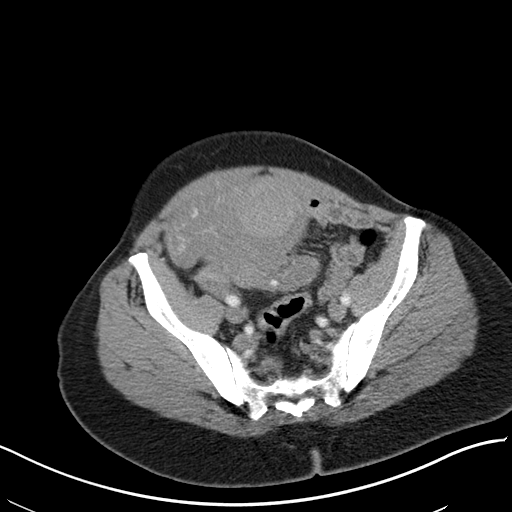
[im 34/94  soft-tissue]
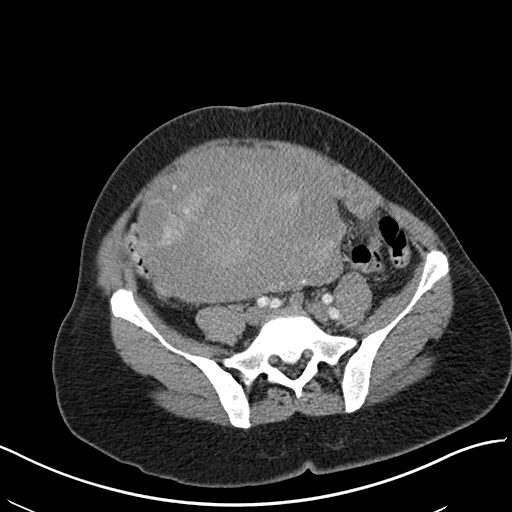
[im 40/94  soft-tissue]
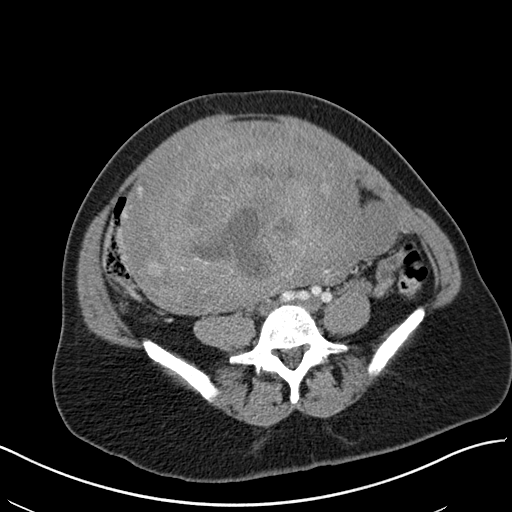
[im 47/94  soft-tissue]
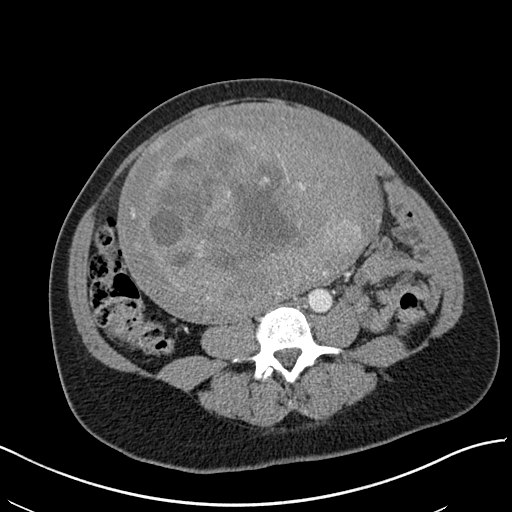
[im 54/94  soft-tissue]
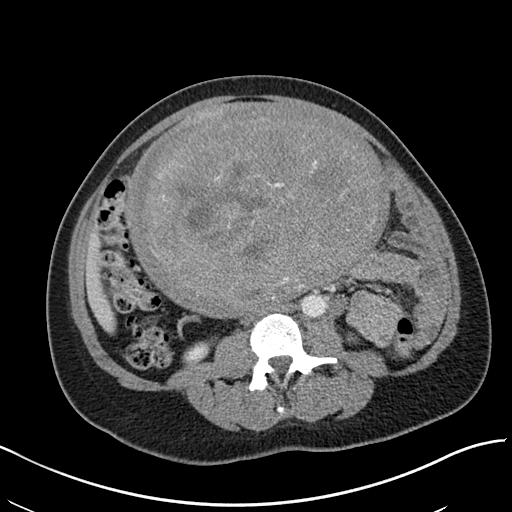
[im 60/94  soft-tissue]
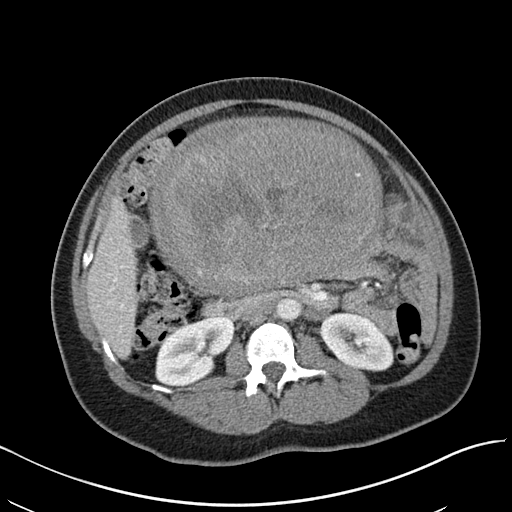
[im 60/94  bone]
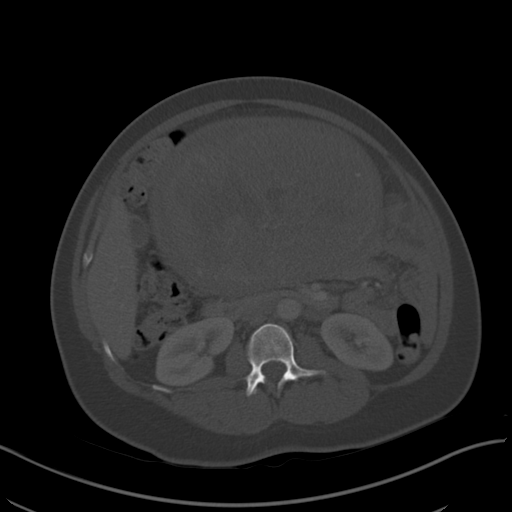
[im 67/94  soft-tissue]
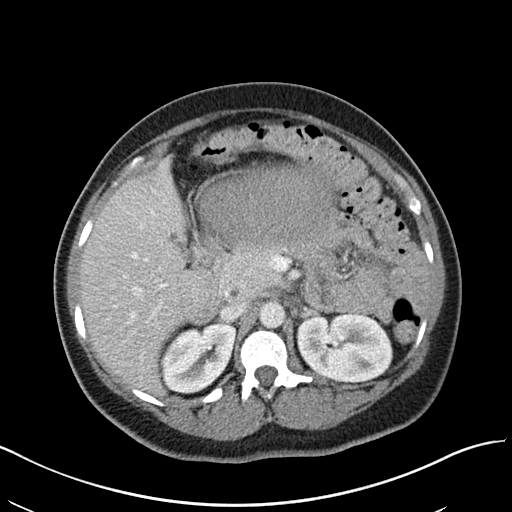
[im 74/94  soft-tissue]
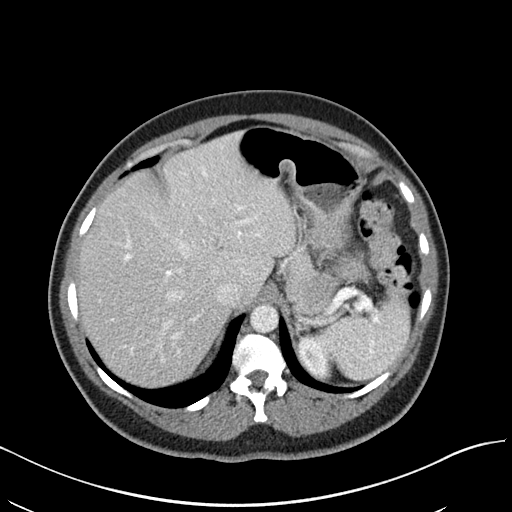
[im 80/94  soft-tissue]
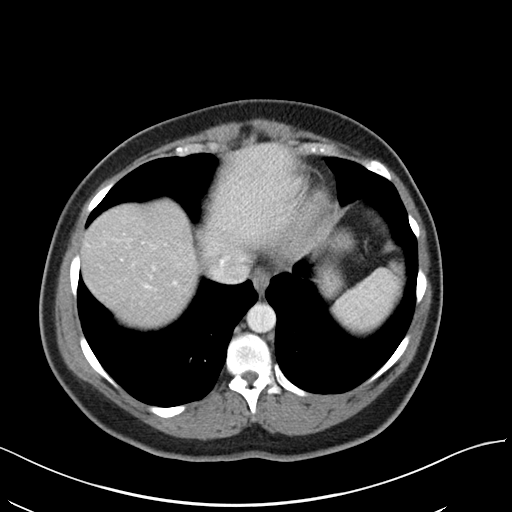
[im 87/94  soft-tissue]
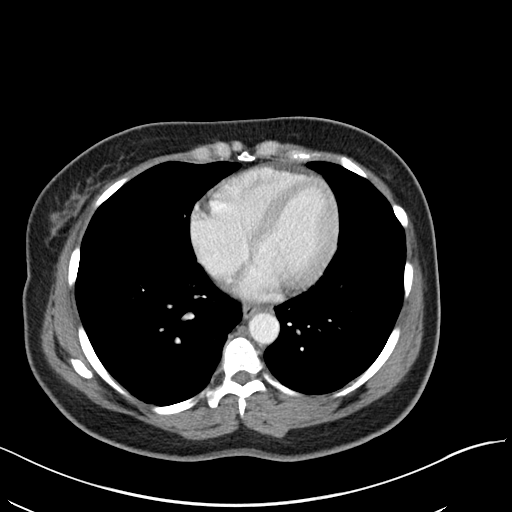

[Series 5: coronal st · coronal · 0.87mm/px · 3 of 137 slices shown]
[im 46/137  soft-tissue]
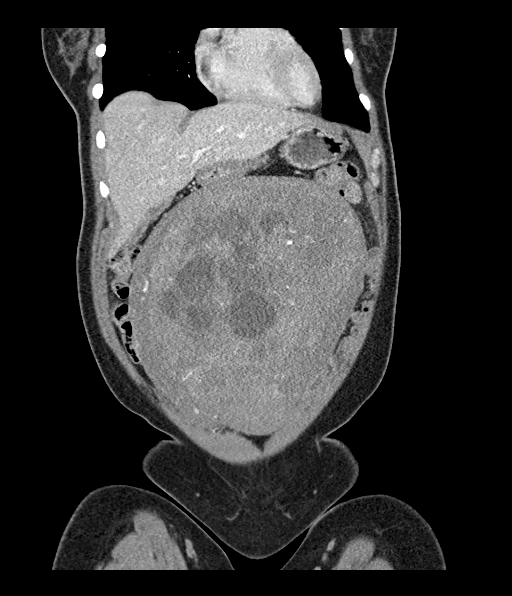
[im 61/137  soft-tissue]
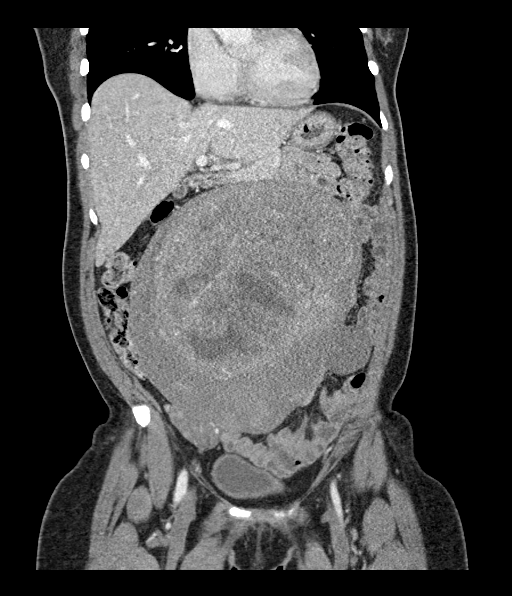
[im 76/137  soft-tissue]
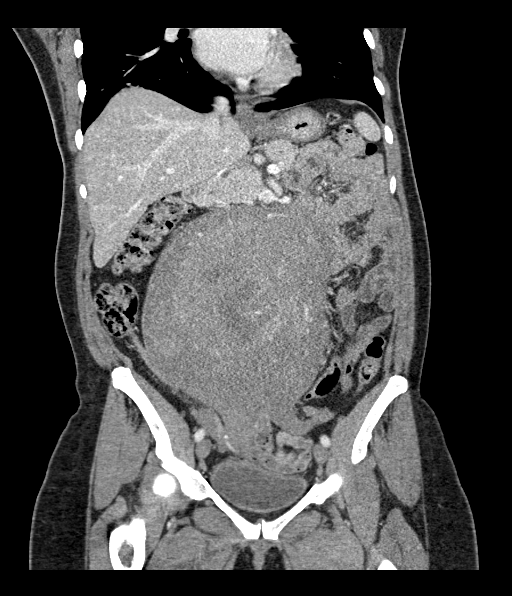

[16 of 46 positions shown; findings below may reference images not displayed]

FINDINGS: Lower chest: Scarring in the medial aspect of the right lower lobe
and at the right lung base.

Hepatobiliary: No focal liver abnormality is seen. No gallstones,
gallbladder wall thickening, or biliary dilatation.

Pancreas: Unremarkable. No pancreatic ductal dilatation or
surrounding inflammatory changes.

Spleen: Normal in size without focal abnormality.

Adrenals/Urinary Tract: Adrenal glands are unremarkable. Kidneys are
normal, without renal calculi, focal lesion, or hydronephrosis.
Bladder is unremarkable.

Stomach/Bowel: Bowel shows no evidence of obstruction, ileus,
inflammation or lesion. No free intraperitoneal air identified.

Vascular/Lymphatic: No significant vascular findings are present. No
enlarged abdominal or pelvic lymph nodes.

Reproductive: The uterus is massively enlarged and measures
approximately 20.4 x 15.9 x 27 cm. Estimated uterine volume is 4,554
mL. Dominant central fundal fibroid occupies much of the enlarged
uterus and measures up to approximately 17.5 cm in estimated maximal
diameter. There are multiple other intramural and subserosal
fibroids present. The ovaries are not well visualized and likely
compressed by the enlarged uterus.

Other: No abdominal wall hernia or abnormality. No abdominopelvic
ascites.

Musculoskeletal: No acute or significant osseous findings.
IMPRESSION: Massive uterine enlargement with estimated uterine volume of
approximately 6200 mL and multiple uterine fibroids with a dominant
partially degenerated central fundal fibroid measuring nearly 18 cm
in estimated maximal diameter. No evidence of free fluid or
hemorrhage in the pelvis or peritoneal cavity.

## 2022-01-31 ENCOUNTER — Telehealth: Payer: Self-pay | Admitting: *Deleted

## 2022-01-31 NOTE — Telephone Encounter (Signed)
Patient called asking can she have a massage. Pt had surgery 08/06/2021. Per DR. Dellis Filbert last note :Can resume all physical and sexual activities, no restriction. F/U Annual Gyn exam in a year.

## 2022-02-01 ENCOUNTER — Encounter: Payer: Self-pay | Admitting: Obstetrics & Gynecology

## 2022-02-03 NOTE — Telephone Encounter (Signed)
MyChart message to patient.   OV scheduled 1/29 at 1:30pm.   Routing to provider for final review. Will close encounter.

## 2022-02-10 ENCOUNTER — Ambulatory Visit (INDEPENDENT_AMBULATORY_CARE_PROVIDER_SITE_OTHER): Payer: Medicare Other | Admitting: Obstetrics & Gynecology

## 2022-02-10 ENCOUNTER — Encounter: Payer: Self-pay | Admitting: Obstetrics & Gynecology

## 2022-02-10 VITALS — BP 124/82 | HR 93 | Wt 187.0 lb

## 2022-02-10 DIAGNOSIS — N951 Menopausal and female climacteric states: Secondary | ICD-10-CM

## 2022-02-10 DIAGNOSIS — Z90710 Acquired absence of both cervix and uterus: Secondary | ICD-10-CM | POA: Diagnosis not present

## 2022-02-10 MED ORDER — ESTRADIOL 0.1 MG/24HR TD PTWK: 0.1000 mg | MEDICATED_PATCH | TRANSDERMAL | 4 refills | Status: DC

## 2022-02-10 NOTE — Progress Notes (Signed)
    Kristina Johnston [birth year] [id number]        52 y.o.  G1P1001   RP: Severe hot flushes and night sweats x 09/2021  HPI: Severe hot flushes and night sweats preventing good sleep x post op TAH BS in 09/2021.  No personal or fam h/o Breast Ca.  No h/o DVT/PE or Stroke.   OB History  Gravida Para Term Preterm AB Living  '1 1 1     1  '$ SAB IAB Ectopic Multiple Live Births               # Outcome Date GA Lbr Len/2nd Weight Sex Delivery Anes PTL Lv  1 Term             Past medical history,surgical history, problem list, medications, allergies, family history and social history were all reviewed and documented in the EPIC chart.   Directed ROS with pertinent positives and negatives documented in the history of present illness/assessment and plan.  Exam:  Vitals:   02/10/22 0847  BP: 124/82  Pulse: 93  SpO2: 99%  Weight: 187 lb (84.8 kg)   General appearance:  Normal  Gynecologic exam: Deferred   Assessment/Plan:  52 y.o. G1P1001   1. Menopausal syndrome (hot flushes) Severe hot flushes and night sweats preventing good sleep x post op TAH BS in 09/2021.  No personal or fam h/o Breast Ca.  No h/o DVT/PE or Stroke.  Benefits of HRT outweigh the risks.  No CI.  Counseling done on Estradiol replacement therapy.  S/P TAH/BS.  Will start on Estradiol 0.1 patch weekly.  Usage reviewed and prescription sent to pharmacy.  2. S/P TAH (total abdominal hysterectomy)  Other orders - estradiol (CLIMARA - DOSED IN MG/24 HR) 0.1 mg/24hr patch; Place 1 patch (0.1 mg total) onto the skin once a week.   [name] MD, 9:01 AM 02/10/2022

## 2022-03-25 NOTE — Progress Notes (Signed)
This encounter was created in error - please disregard.

## 2022-05-05 ENCOUNTER — Ambulatory Visit (INDEPENDENT_AMBULATORY_CARE_PROVIDER_SITE_OTHER): Payer: Medicare Other | Admitting: Emergency Medicine

## 2022-05-05 ENCOUNTER — Other Ambulatory Visit: Payer: Self-pay | Admitting: *Deleted

## 2022-05-05 ENCOUNTER — Encounter: Payer: Self-pay | Admitting: Emergency Medicine

## 2022-05-05 ENCOUNTER — Telehealth: Payer: Self-pay | Admitting: Emergency Medicine

## 2022-05-05 VITALS — BP 128/72 | HR 85 | Temp 98.2°F | Ht 65.0 in | Wt 198.5 lb

## 2022-05-05 DIAGNOSIS — I10 Essential (primary) hypertension: Secondary | ICD-10-CM

## 2022-05-05 DIAGNOSIS — F3176 Bipolar disorder, in full remission, most recent episode depressed: Secondary | ICD-10-CM

## 2022-05-05 DIAGNOSIS — E6609 Other obesity due to excess calories: Secondary | ICD-10-CM | POA: Diagnosis not present

## 2022-05-05 DIAGNOSIS — F5104 Psychophysiologic insomnia: Secondary | ICD-10-CM | POA: Diagnosis not present

## 2022-05-05 DIAGNOSIS — Z6833 Body mass index (BMI) 33.0-33.9, adult: Secondary | ICD-10-CM

## 2022-05-05 DIAGNOSIS — F32A Depression, unspecified: Secondary | ICD-10-CM

## 2022-05-05 LAB — CBC WITH DIFFERENTIAL/PLATELET
Basophils Absolute: 0 10*3/uL (ref 0.0–0.1)
Basophils Relative: 0.9 % (ref 0.0–3.0)
Eosinophils Absolute: 0.1 10*3/uL (ref 0.0–0.7)
Eosinophils Relative: 1.3 % (ref 0.0–5.0)
HCT: 39.9 % (ref 36.0–46.0)
Hemoglobin: 13.4 g/dL (ref 12.0–15.0)
Lymphocytes Relative: 42.3 % (ref 12.0–46.0)
Lymphs Abs: 2.1 10*3/uL (ref 0.7–4.0)
MCHC: 33.5 g/dL (ref 30.0–36.0)
MCV: 89.3 fl (ref 78.0–100.0)
Monocytes Absolute: 0.3 10*3/uL (ref 0.1–1.0)
Monocytes Relative: 6.4 % (ref 3.0–12.0)
Neutro Abs: 2.5 10*3/uL (ref 1.4–7.7)
Neutrophils Relative %: 49.1 % (ref 43.0–77.0)
Platelets: 261 10*3/uL (ref 150.0–400.0)
RBC: 4.46 Mil/uL (ref 3.87–5.11)
RDW: 14.5 % (ref 11.5–15.5)
WBC: 5 10*3/uL (ref 4.0–10.5)

## 2022-05-05 LAB — COMPREHENSIVE METABOLIC PANEL
ALT: 20 U/L (ref 0–35)
AST: 19 U/L (ref 0–37)
Albumin: 4.5 g/dL (ref 3.5–5.2)
Alkaline Phosphatase: 67 U/L (ref 39–117)
BUN: 13 mg/dL (ref 6–23)
CO2: 24 mEq/L (ref 19–32)
Calcium: 9.4 mg/dL (ref 8.4–10.5)
Chloride: 103 mEq/L (ref 96–112)
Creatinine, Ser: 0.86 mg/dL (ref 0.40–1.20)
GFR: 77.12 mL/min (ref >60.00)
Glucose, Bld: 117 mg/dL — ABNORMAL HIGH (ref 70–99)
Potassium: 3.6 mEq/L (ref 3.5–5.1)
Sodium: 138 mEq/L (ref 135–145)
Total Bilirubin: 0.4 mg/dL (ref 0.2–1.2)
Total Protein: 7.4 g/dL (ref 6.0–8.3)

## 2022-05-05 NOTE — Telephone Encounter (Signed)
CBC, and CMP

## 2022-05-05 NOTE — Patient Instructions (Signed)
Hypertension, Adult High blood pressure (hypertension) is when the force of blood pumping through the arteries is too strong. The arteries are the blood vessels that carry blood from the heart throughout the body. Hypertension forces the heart to work harder to pump blood and may cause arteries to become narrow or stiff. Untreated or uncontrolled hypertension can lead to a heart attack, heart failure, a stroke, kidney disease, and other problems. A blood pressure reading consists of a higher number over a lower number. Ideally, your blood pressure should be below 120/80. The first ("top") number is called the systolic pressure. It is a measure of the pressure in your arteries as your heart beats. The second ("bottom") number is called the diastolic pressure. It is a measure of the pressure in your arteries as the heart relaxes. What are the causes? The exact cause of this condition is not known. There are some conditions that result in high blood pressure. What increases the risk? Certain factors may make you more likely to develop high blood pressure. Some of these risk factors are under your control, including: Smoking. Not getting enough exercise or physical activity. Being overweight. Having too much fat, sugar, calories, or salt (sodium) in your diet. Drinking too much alcohol. Other risk factors include: Having a personal history of heart disease, diabetes, high cholesterol, or kidney disease. Stress. Having a family history of high blood pressure and high cholesterol. Having obstructive sleep apnea. Age. The risk increases with age. What are the signs or symptoms? High blood pressure may not cause symptoms. Very high blood pressure (hypertensive crisis) may cause: Headache. Fast or irregular heartbeats (palpitations). Shortness of breath. Nosebleed. Nausea and vomiting. Vision changes. Severe chest pain, dizziness, and seizures. How is this diagnosed? This condition is diagnosed by  measuring your blood pressure while you are seated, with your arm resting on a flat surface, your legs uncrossed, and your feet flat on the floor. The cuff of the blood pressure monitor will be placed directly against the skin of your upper arm at the level of your heart. Blood pressure should be measured at least twice using the same arm. Certain conditions can cause a difference in blood pressure between your right and left arms. If you have a high blood pressure reading during one visit or you have normal blood pressure with other risk factors, you may be asked to: Return on a different day to have your blood pressure checked again. Monitor your blood pressure at home for 1 week or longer. If you are diagnosed with hypertension, you may have other blood or imaging tests to help your health care provider understand your overall risk for other conditions. How is this treated? This condition is treated by making healthy lifestyle changes, such as eating healthy foods, exercising more, and reducing your alcohol intake. You may be referred for counseling on a healthy diet and physical activity. Your health care provider may prescribe medicine if lifestyle changes are not enough to get your blood pressure under control and if: Your systolic blood pressure is above 130. Your diastolic blood pressure is above 80. Your personal target blood pressure may vary depending on your medical conditions, your age, and other factors. Follow these instructions at home: Eating and drinking  Eat a diet that is high in fiber and potassium, and low in sodium, added sugar, and fat. An example of this eating plan is called the DASH diet. DASH stands for Dietary Approaches to Stop Hypertension. To eat this way: Eat   plenty of fresh fruits and vegetables. Try to fill one half of your plate at each meal with fruits and vegetables. Eat whole grains, such as whole-wheat pasta, brown rice, or whole-grain bread. Fill about one  fourth of your plate with whole grains. Eat or drink low-fat dairy products, such as skim milk or low-fat yogurt. Avoid fatty cuts of meat, processed or cured meats, and poultry with skin. Fill about one fourth of your plate with lean proteins, such as fish, chicken without skin, beans, eggs, or tofu. Avoid pre-made and processed foods. These tend to be higher in sodium, added sugar, and fat. Reduce your daily sodium intake. Many people with hypertension should eat less than 1,500 mg of sodium a day. Do not drink alcohol if: Your health care provider tells you not to drink. You are pregnant, may be pregnant, or are planning to become pregnant. If you drink alcohol: Limit how much you have to: 0-1 drink a day for women. 0-2 drinks a day for men. Know how much alcohol is in your drink. In the [place] one drink equals one 12 oz bottle of beer (355 mL), one 5 oz glass of wine (148 mL), or one 1 oz glass of hard liquor (44 mL). Lifestyle  Work with your health care provider to maintain a healthy body weight or to lose weight. Ask what an ideal weight is for you. Get at least 30 minutes of exercise that causes your heart to beat faster (aerobic exercise) most days of the week. Activities may include walking, swimming, or biking. Include exercise to strengthen your muscles (resistance exercise), such as Pilates or lifting weights, as part of your weekly exercise routine. Try to do these types of exercises for 30 minutes at least 3 days a week. Do not use any products that contain nicotine or tobacco. These products include cigarettes, chewing tobacco, and vaping devices, such as e-cigarettes. If you need help quitting, ask your health care provider. Monitor your blood pressure at home as told by your health care provider. Keep all follow-up visits. This is important. Medicines Take over-the-counter and prescription medicines only as told by your health care provider. Follow directions carefully. Blood  pressure medicines must be taken as prescribed. Do not skip doses of blood pressure medicine. Doing this puts you at risk for problems and can make the medicine less effective. Ask your health care provider about side effects or reactions to medicines that you should watch for. Contact a health care provider if you: Think you are having a reaction to a medicine you are taking. Have headaches that keep coming back (recurring). Feel dizzy. Have swelling in your ankles. Have trouble with your vision. Get help right away if you: Develop a severe headache or confusion. Have unusual weakness or numbness. Feel faint. Have severe pain in your chest or abdomen. Vomit repeatedly. Have trouble breathing. These symptoms may be an emergency. Get help right away. Call 911. Do not wait to see if the symptoms will go away. Do not drive yourself to the hospital. Summary Hypertension is when the force of blood pumping through your arteries is too strong. If this condition is not controlled, it may put you at risk for serious complications. Your personal target blood pressure may vary depending on your medical conditions, your age, and other factors. For most people, a normal blood pressure is less than 120/80. Hypertension is treated with lifestyle changes, medicines, or a combination of both. Lifestyle changes include losing weight, eating a healthy,   low-sodium diet, exercising more, and limiting alcohol. This information is not intended to replace advice given to you by your health care provider. Make sure you discuss any questions you have with your health care provider. Document Revised: 11/06/2020 Document Reviewed: 11/06/2020 [employer] Patient Education  2023 [employer]  

## 2022-05-05 NOTE — Addendum Note (Signed)
Addended by: Larene Pickett D on: 05/05/2022 12:15 PM   Modules accepted: Orders

## 2022-05-05 NOTE — Assessment & Plan Note (Signed)
BP Readings from Last 3 Encounters:  05/05/22 128/72  02/10/22 124/82  11/04/21 118/82  Well-controlled hypertension Continue amlodipine 10 mg daily Cardiovascular risk associated with hypertension discussed Dietary approaches to stop hypertension discussed

## 2022-05-05 NOTE — Telephone Encounter (Signed)
Called patient and left message for patient to come to office to get labs done.

## 2022-05-05 NOTE — Telephone Encounter (Signed)
Dr. Milagros Evener office called states that they would like lab results (CBC or any other labs that could be helpful) faxed over due to patient wanting to start back on depakote. The fax number is [phone], if any questions they can be reached at [phone].

## 2022-05-05 NOTE — Assessment & Plan Note (Signed)
Diet and nutrition discussed. Advised to decrease amount of daily carbohydrate intake and daily calories and increase amount of plant based protein in her diet Benefits of exercise discussed. 

## 2022-05-05 NOTE — Assessment & Plan Note (Signed)
Still hearing voices at night.  Continues Haldol 4 mg at bedtime Follows up with psychiatrist on a regular basis Their office handling psychiatric medications.

## 2022-05-05 NOTE — Assessment & Plan Note (Signed)
Stable.  Sees psychiatrist on a regular basis. 

## 2022-05-05 NOTE — Assessment & Plan Note (Signed)
Recommend to take melatonin 10 to 20 mg daily at bedtime. Sleep hygiene discussed.

## 2022-05-05 NOTE — Progress Notes (Signed)
Wt Readings from Last 3 Encounters:  05/05/22 198 lb 8 oz (90 kg)  02/10/22 187 lb (84.8 kg)  11/04/21 187 lb (84.8 kg)   Kristina Johnston 52 y.o.   Chief Complaint  Patient presents with   Medical Management of Chronic Issues    6 mnth f/u appt, patient states she is having issues losing weight. Patient states with her depression she sis eating more    sleep issues    Trouble going to sleep, poss insomnia     HISTORY OF PRESENT ILLNESS: This is a 52 y.o. female here for follow-up of chronic medical problems including hypertension History of chronic insomnia Still having some trouble losing weight Has history of depression and bipolar disorder.  Sees psychiatrist on a regular basis. No other complaints or medical concerns today.  HPI   Prior to Admission medications   Medication Sig Start Date End Date Taking? Authorizing Provider  amLODipine (NORVASC) 10 MG tablet TAKE 1 TABLET(10 MG) BY MOUTH DAILY 06/15/21  Yes Aarohi Redditt, Eilleen Kempf, MD  Ascorbic Acid (VITAMIN C PO) Take 1,000 mg by mouth daily.   Yes [provider]  Flaxseed, Linseed, (FLAXSEED OIL) 1000 MG CAPS Take 1,000 mg by mouth in the morning and at bedtime.   Yes [provider]  haloperidol (HALDOL) 2 MG/ML solution Take 4 mg by mouth at bedtime. 08/25/17  Yes [provider]  loratadine (CLARITIN) 10 MG tablet Take 10 mg by mouth daily.   Yes [provider]  Multiple Vitamin (MULTIVITAMIN PO) Take 1 tablet by mouth daily at 6 (six) AM.   Yes [provider]  Potassium 99 MG TABS Take 99 mg by mouth daily.   Yes [provider]  Valproate Sodium (DEPAKENE) 250 MG/5ML SOLN solution Take 1,000 mg by mouth at bedtime.   Yes [provider]  estradiol (CLIMARA - DOSED IN MG/24 HR) 0.1 mg/24hr patch Place 1 patch (0.1 mg total) onto the skin once a week. Patient not taking: Reported on 05/05/2022 02/10/22   Genia Del, MD  ferrous sulfate 220 (44 Fe)  MG/5ML solution Take 5 mLs (220 mg total) by mouth 2 (two) times daily. Patient not taking: Reported on 02/10/2022 04/17/21   Georgina Quint, MD  triamcinolone (KENALOG) 0.1 % APPLY TO SKIN TWICE A DAY FOR NO MORE THAN 2 WEEKS Patient not taking: Reported on 05/05/2022 11/29/19   Georgina Quint, MD    No Known Allergies  Patient Active Problem List   Diagnosis Date Noted   Uterine fibroid 08/06/2021   Fibroid tumor 04/23/2020   Essential hypertension 06/06/2017   Bipolar disorder 04/08/2017   Depression 03/17/2011    Past Medical History:  Diagnosis Date   Anemia    hx of   Anxiety    Bipolar disorder    Depression    Hypertension    Paranoid schizophrenia     Past Surgical History:  Procedure Laterality Date   DILATION AND CURETTAGE OF UTERUS  1995   pt had abortion and believes she had to have a D&E or D&C   HYSTERECTOMY ABDOMINAL WITH SALPINGECTOMY Bilateral 08/06/2021   Procedure: HYSTERECTOMY ABDOMINAL WITH BILATERAL SALPINGECTOMY;  Surgeon: Genia Del, MD;  Location: [facility] OR;  Service: Gynecology;  Laterality: Bilateral;   WISDOM TOOTH EXTRACTION      Social History   Socioeconomic History   Marital status: Divorced    Spouse name: Not on file   Number of children: 1   Years of education:  Not on file   Highest education level: Bachelor's degree (e.g., BA, AB, BS)  Occupational History   Not on file  Tobacco Use   Smoking status: Never   Smokeless tobacco: Never  Vaping Use   Vaping Use: Never used  Substance and Sexual Activity   Alcohol use: No   Drug use: No   Sexual activity: Not Currently    Partners: Male    Birth control/protection: Surgical    Comment: 1st intercourse-19, partners- 4, hysterectomy  Other Topics Concern   Not on file  Social History Narrative   Not on file   Social Determinants of Health   Financial Resource Strain: Medium Risk (05/01/2022)   Overall Financial Resource Strain (CARDIA)    Difficulty of  Paying Living Expenses: Somewhat hard  Food Insecurity: Food Insecurity Present (05/01/2022)   Hunger Vital Sign    Worried About Running Out of Food in the Last Year: Sometimes true    Ran Out of Food in the Last Year: Often true  Transportation Needs: Patient Declined (05/01/2022)   PRAPARE - Transportation    Lack of Transportation (Medical): Patient declined    Lack of Transportation (Non-Medical): Patient declined  Physical Activity: Unknown (05/01/2022)   Exercise Vital Sign    Days of Exercise per Week: Patient declined    Minutes of Exercise per Session: 20 min  Stress: Stress Concern Present (05/01/2022)   [employer] of Occupational Health - Occupational Stress Questionnaire    Feeling of Stress : To some extent  Social Connections: Unknown (05/01/2022)   Social Connection and Isolation Panel [NHANES]    Frequency of Communication with Friends and Family: Patient declined    Frequency of Social Gatherings with Friends and Family: Never    Attends Religious Services: Never    Database administrator or Organizations: No    Attends Banker Meetings: Never    Marital Status: Divorced  Catering manager Violence: Not At Risk (06/13/2021)   Humiliation, Afraid, Rape, and Kick questionnaire    Fear of Current or Ex-Partner: No    Emotionally Abused: No    Physically Abused: No    Sexually Abused: No    Family History  Problem Relation Age of Onset   Heart disease Mother    Hyperlipidemia Mother    Mental illness Mother    Diabetes Father    Hypertension Sister    Hyperlipidemia Brother    Hypertension Brother    Cancer Maternal Grandmother    Cancer Maternal Grandfather    Hyperlipidemia Maternal Grandfather    Hypertension Maternal Grandfather    Breast cancer Maternal Grandfather    Cancer Paternal Grandmother 37       breast cancer   Hyperlipidemia Paternal Grandfather    Hypertension Paternal Grandfather    Cancer Maternal Aunt 40       breast  cancer   Breast cancer Maternal Aunt    Breast cancer Cousin      Review of Systems  Constitutional: Negative.  Negative for chills and fever.  HENT: Negative.  Negative for congestion and sore throat.   Respiratory: Negative.  Negative for cough and shortness of breath.   Cardiovascular: Negative.  Negative for chest pain and palpitations.  Gastrointestinal:  Negative for abdominal pain, nausea and vomiting.  Genitourinary: Negative.  Negative for dysuria and hematuria.  Skin: Negative.  Negative for rash.  Neurological: Negative.  Negative for dizziness and headaches.  All other systems reviewed and are negative.  Vitals:   05/05/22 0943  BP: 128/72  Pulse: 85  Temp: 98.2 F (36.8 C)  SpO2: 97%    Physical Exam Vitals reviewed.  Constitutional:      Appearance: Normal appearance.  HENT:     Head: Normocephalic.  Eyes:     Extraocular Movements: Extraocular movements intact.     Conjunctiva/sclera: Conjunctivae normal.     Pupils: Pupils are equal, round, and reactive to light.  Cardiovascular:     Rate and Rhythm: Normal rate and regular rhythm.     Pulses: Normal pulses.     Heart sounds: Normal heart sounds.  Pulmonary:     Effort: Pulmonary effort is normal.     Breath sounds: Normal breath sounds.  Abdominal:     Palpations: Abdomen is soft.     Tenderness: There is no abdominal tenderness.  Skin:    General: Skin is warm and dry.     Capillary Refill: Capillary refill takes less than 2 seconds.  Neurological:     General: No focal deficit present.     Mental Status: She is alert and oriented to person, place, and time.  Psychiatric:        Mood and Affect: Mood normal.        Behavior: Behavior normal.      ASSESSMENT & PLAN: A total of 43 minutes was spent with the patient and counseling/coordination of care regarding preparing for this visit, review of most recent office visit notes, review of multiple chronic medical problems under management,  review of all medications, education on nutrition, cardiovascular risks associated with hypertension, prognosis, documentation, and need for follow-up.  Problem List Items Addressed This Visit       Cardiovascular and Mediastinum   Essential hypertension - Primary    BP Readings from Last 3 Encounters:  05/05/22 128/72  02/10/22 124/82  11/04/21 118/82  Well-controlled hypertension Continue amlodipine 10 mg daily Cardiovascular risk associated with hypertension discussed Dietary approaches to stop hypertension discussed         Other   Depression    Stable.  Sees psychiatrist on a regular basis.      Bipolar disorder    Still hearing voices at night.  Continues Haldol 4 mg at bedtime Follows up with psychiatrist on a regular basis Their office handling psychiatric medications.      Chronic insomnia    Recommend to take melatonin 10 to 20 mg daily at bedtime. Sleep hygiene discussed.      Class 1 obesity due to excess calories without serious comorbidity with body mass index (BMI) of 33.0 to 33.9 in adult    Diet and nutrition discussed. Advised to decrease amount of daily carbohydrate intake and daily calories and increase amount of plant-based protein in her diet Benefits of exercise discussed      Patient Instructions  Hypertension, Adult High blood pressure (hypertension) is when the force of blood pumping through the arteries is too strong. The arteries are the blood vessels that carry blood from the heart throughout the body. Hypertension forces the heart to work harder to pump blood and may cause arteries to become narrow or stiff. Untreated or uncontrolled hypertension can lead to a heart attack, heart failure, a stroke, kidney disease, and other problems. A blood pressure reading consists of a higher number over a lower number. Ideally, your blood pressure should be below 120/80. The first ("top") number is called the systolic pressure. It is a measure of the  pressure in  your arteries as your heart beats. The second ("bottom") number is called the diastolic pressure. It is a measure of the pressure in your arteries as the heart relaxes. What are the causes? The exact cause of this condition is not known. There are some conditions that result in high blood pressure. What increases the risk? Certain factors may make you more likely to develop high blood pressure. Some of these risk factors are under your control, including: Smoking. Not getting enough exercise or physical activity. Being overweight. Having too much fat, sugar, calories, or salt (sodium) in your diet. Drinking too much alcohol. Other risk factors include: Having a personal history of heart disease, diabetes, high cholesterol, or kidney disease. Stress. Having a family history of high blood pressure and high cholesterol. Having obstructive sleep apnea. Age. The risk increases with age. What are the signs or symptoms? High blood pressure may not cause symptoms. Very high blood pressure (hypertensive crisis) may cause: Headache. Fast or irregular heartbeats (palpitations). Shortness of breath. Nosebleed. Nausea and vomiting. Vision changes. Severe chest pain, dizziness, and seizures. How is this diagnosed? This condition is diagnosed by measuring your blood pressure while you are seated, with your arm resting on a flat surface, your legs uncrossed, and your feet flat on the floor. The cuff of the blood pressure monitor will be placed directly against the skin of your upper arm at the level of your heart. Blood pressure should be measured at least twice using the same arm. Certain conditions can cause a difference in blood pressure between your right and left arms. If you have a high blood pressure reading during one visit or you have normal blood pressure with other risk factors, you may be asked to: Return on a different day to have your blood pressure checked again. Monitor your  blood pressure at home for 1 week or longer. If you are diagnosed with hypertension, you may have other blood or imaging tests to help your health care provider understand your overall risk for other conditions. How is this treated? This condition is treated by making healthy lifestyle changes, such as eating healthy foods, exercising more, and reducing your alcohol intake. You may be referred for counseling on a healthy diet and physical activity. Your health care provider may prescribe medicine if lifestyle changes are not enough to get your blood pressure under control and if: Your systolic blood pressure is above 130. Your diastolic blood pressure is above 80. Your personal target blood pressure may vary depending on your medical conditions, your age, and other factors. Follow these instructions at home: Eating and drinking  Eat a diet that is high in fiber and potassium, and low in sodium, added sugar, and fat. An example of this eating plan is called the DASH diet. DASH stands for Dietary Approaches to Stop Hypertension. To eat this way: Eat plenty of fresh fruits and vegetables. Try to fill one half of your plate at each meal with fruits and vegetables. Eat whole grains, such as whole-wheat pasta, brown rice, or whole-grain bread. Fill about one fourth of your plate with whole grains. Eat or drink low-fat dairy products, such as skim milk or low-fat yogurt. Avoid fatty cuts of meat, processed or cured meats, and poultry with skin. Fill about one fourth of your plate with lean proteins, such as fish, chicken without skin, beans, eggs, or tofu. Avoid pre-made and processed foods. These tend to be higher in sodium, added sugar, and fat. Reduce your daily sodium intake.  Many people with hypertension should eat less than 1,500 mg of sodium a day. Do not drink alcohol if: Your health care provider tells you not to drink. You are pregnant, may be pregnant, or are planning to become pregnant. If  you drink alcohol: Limit how much you have to: 0-1 drink a day for women. 0-2 drinks a day for men. Know how much alcohol is in your drink. In the [place] one drink equals one 12 oz bottle of beer (355 mL), one 5 oz glass of wine (148 mL), or one 1 oz glass of hard liquor (44 mL). Lifestyle  Work with your health care provider to maintain a healthy body weight or to lose weight. Ask what an ideal weight is for you. Get at least 30 minutes of exercise that causes your heart to beat faster (aerobic exercise) most days of the week. Activities may include walking, swimming, or biking. Include exercise to strengthen your muscles (resistance exercise), such as Pilates or lifting weights, as part of your weekly exercise routine. Try to do these types of exercises for 30 minutes at least 3 days a week. Do not use any products that contain nicotine or tobacco. These products include cigarettes, chewing tobacco, and vaping devices, such as e-cigarettes. If you need help quitting, ask your health care provider. Monitor your blood pressure at home as told by your health care provider. Keep all follow-up visits. This is important. Medicines Take over-the-counter and prescription medicines only as told by your health care provider. Follow directions carefully. Blood pressure medicines must be taken as prescribed. Do not skip doses of blood pressure medicine. Doing this puts you at risk for problems and can make the medicine less effective. Ask your health care provider about side effects or reactions to medicines that you should watch for. Contact a health care provider if you: Think you are having a reaction to a medicine you are taking. Have headaches that keep coming back (recurring). Feel dizzy. Have swelling in your ankles. Have trouble with your vision. Get help right away if you: Develop a severe headache or confusion. Have unusual weakness or numbness. Feel faint. Have severe pain in your chest  or abdomen. Vomit repeatedly. Have trouble breathing. These symptoms may be an emergency. Get help right away. Call 911. Do not wait to see if the symptoms will go away. Do not drive yourself to the hospital. Summary Hypertension is when the force of blood pumping through your arteries is too strong. If this condition is not controlled, it may put you at risk for serious complications. Your personal target blood pressure may vary depending on your medical conditions, your age, and other factors. For most people, a normal blood pressure is less than 120/80. Hypertension is treated with lifestyle changes, medicines, or a combination of both. Lifestyle changes include losing weight, eating a healthy, low-sodium diet, exercising more, and limiting alcohol. This information is not intended to replace advice given to you by your health care provider. Make sure you discuss any questions you have with your health care provider. Document Revised: 11/06/2020 Document Reviewed: 11/06/2020 Elsevier Patient Education  2023 [employer].     Edwina Barth, MD [facility]

## 2022-05-07 NOTE — Telephone Encounter (Signed)
Faxed labs to patient requested provider

## 2022-06-02 ENCOUNTER — Other Ambulatory Visit: Payer: Self-pay | Admitting: Emergency Medicine

## 2022-06-02 DIAGNOSIS — I10 Essential (primary) hypertension: Secondary | ICD-10-CM

## 2022-09-19 ENCOUNTER — Ambulatory Visit: Payer: Medicare Other | Admitting: Obstetrics & Gynecology

## 2022-10-07 ENCOUNTER — Ambulatory Visit: Payer: Medicare Other

## 2022-11-04 ENCOUNTER — Encounter: Payer: Self-pay | Admitting: Emergency Medicine

## 2022-11-04 ENCOUNTER — Ambulatory Visit: Payer: Medicare Other | Admitting: Emergency Medicine

## 2022-11-04 VITALS — BP 124/84 | HR 78 | Temp 98.2°F | Ht 65.0 in | Wt 187.4 lb

## 2022-11-04 DIAGNOSIS — F3176 Bipolar disorder, in full remission, most recent episode depressed: Secondary | ICD-10-CM

## 2022-11-04 DIAGNOSIS — E6609 Other obesity due to excess calories: Secondary | ICD-10-CM

## 2022-11-04 DIAGNOSIS — Z1211 Encounter for screening for malignant neoplasm of colon: Secondary | ICD-10-CM

## 2022-11-04 DIAGNOSIS — I10 Essential (primary) hypertension: Secondary | ICD-10-CM

## 2022-11-04 DIAGNOSIS — E66811 Obesity, class 1: Secondary | ICD-10-CM | POA: Diagnosis not present

## 2022-11-04 DIAGNOSIS — Z1231 Encounter for screening mammogram for malignant neoplasm of breast: Secondary | ICD-10-CM

## 2022-11-04 DIAGNOSIS — Z6833 Body mass index (BMI) 33.0-33.9, adult: Secondary | ICD-10-CM | POA: Diagnosis not present

## 2022-11-04 LAB — COMPREHENSIVE METABOLIC PANEL
ALT: 20 U/L (ref 0–35)
AST: 18 U/L (ref 0–37)
Albumin: 4.5 g/dL (ref 3.5–5.2)
Alkaline Phosphatase: 79 U/L (ref 39–117)
BUN: 9 mg/dL (ref 6–23)
CO2: 28 meq/L (ref 19–32)
Calcium: 9.4 mg/dL (ref 8.4–10.5)
Chloride: 104 meq/L (ref 96–112)
Creatinine, Ser: 0.7 mg/dL (ref 0.40–1.20)
GFR: 98.38 mL/min (ref >60.00)
Glucose, Bld: 92 mg/dL (ref 70–99)
Potassium: 3.8 meq/L (ref 3.5–5.1)
Sodium: 142 meq/L (ref 135–145)
Total Bilirubin: 0.4 mg/dL (ref 0.2–1.2)
Total Protein: 7.5 g/dL (ref 6.0–8.3)

## 2022-11-04 LAB — LIPID PANEL
Cholesterol: 144 mg/dL (ref 0–200)
HDL: 55.9 mg/dL (ref >39.00)
LDL Cholesterol: 64 mg/dL (ref 0–99)
NonHDL: 87.77
Total CHOL/HDL Ratio: 3
Triglycerides: 119 mg/dL (ref 0.0–149.0)
VLDL: 23.8 mg/dL (ref 0.0–40.0)

## 2022-11-04 LAB — CBC WITH DIFFERENTIAL/PLATELET
Basophils Absolute: 0 10*3/uL (ref 0.0–0.1)
Basophils Relative: 1.1 % (ref 0.0–3.0)
Eosinophils Absolute: 0.1 10*3/uL (ref 0.0–0.7)
Eosinophils Relative: 2 % (ref 0.0–5.0)
HCT: 41.2 % (ref 36.0–46.0)
Hemoglobin: 13.4 g/dL (ref 12.0–15.0)
Lymphocytes Relative: 48.2 % — ABNORMAL HIGH (ref 12.0–46.0)
Lymphs Abs: 2.1 10*3/uL (ref 0.7–4.0)
MCHC: 32.5 g/dL (ref 30.0–36.0)
MCV: 90.1 fL (ref 78.0–100.0)
Monocytes Absolute: 0.3 10*3/uL (ref 0.1–1.0)
Monocytes Relative: 7.9 % (ref 3.0–12.0)
Neutro Abs: 1.8 10*3/uL (ref 1.4–7.7)
Neutrophils Relative %: 40.8 % — ABNORMAL LOW (ref 43.0–77.0)
Platelets: 249 10*3/uL (ref 150.0–400.0)
RBC: 4.57 Mil/uL (ref 3.87–5.11)
RDW: 14.2 % (ref 11.5–15.5)
WBC: 4.4 10*3/uL (ref 4.0–10.5)

## 2022-11-04 NOTE — Assessment & Plan Note (Signed)
BP Readings from Last 3 Encounters:  11/04/22 124/84  05/05/22 128/72  02/10/22 124/82  Well-controlled hypertension Continue amlodipine 10 mg daily Cardiovascular risks associated with hypertension discussed Diet and nutrition discussed Benefits of exercise discussed Blood work done today Follow-up in 6 months

## 2022-11-04 NOTE — Patient Instructions (Signed)
Hypertension, Adult High blood pressure (hypertension) is when the force of blood pumping through the arteries is too strong. The arteries are the blood vessels that carry blood from the heart throughout the body. Hypertension forces the heart to work harder to pump blood and may cause arteries to become narrow or stiff. Untreated or uncontrolled hypertension can lead to a heart attack, heart failure, a stroke, kidney disease, and other problems. A blood pressure reading consists of a higher number over a lower number. Ideally, your blood pressure should be below 120/80. The first ("top") number is called the systolic pressure. It is a measure of the pressure in your arteries as your heart beats. The second ("bottom") number is called the diastolic pressure. It is a measure of the pressure in your arteries as the heart relaxes. What are the causes? The exact cause of this condition is not known. There are some conditions that result in high blood pressure. What increases the risk? Certain factors may make you more likely to develop high blood pressure. Some of these risk factors are under your control, including: Smoking. Not getting enough exercise or physical activity. Being overweight. Having too much fat, sugar, calories, or salt (sodium) in your diet. Drinking too much alcohol. Other risk factors include: Having a personal history of heart disease, diabetes, high cholesterol, or kidney disease. Stress. Having a family history of high blood pressure and high cholesterol. Having obstructive sleep apnea. Age. The risk increases with age. What are the signs or symptoms? High blood pressure may not cause symptoms. Very high blood pressure (hypertensive crisis) may cause: Headache. Fast or irregular heartbeats (palpitations). Shortness of breath. Nosebleed. Nausea and vomiting. Vision changes. Severe chest pain, dizziness, and seizures. How is this diagnosed? This condition is diagnosed by  measuring your blood pressure while you are seated, with your arm resting on a flat surface, your legs uncrossed, and your feet flat on the floor. The cuff of the blood pressure monitor will be placed directly against the skin of your upper arm at the level of your heart. Blood pressure should be measured at least twice using the same arm. Certain conditions can cause a difference in blood pressure between your right and left arms. If you have a high blood pressure reading during one visit or you have normal blood pressure with other risk factors, you may be asked to: Return on a different day to have your blood pressure checked again. Monitor your blood pressure at home for 1 week or longer. If you are diagnosed with hypertension, you may have other blood or imaging tests to help your health care provider understand your overall risk for other conditions. How is this treated? This condition is treated by making healthy lifestyle changes, such as eating healthy foods, exercising more, and reducing your alcohol intake. You may be referred for counseling on a healthy diet and physical activity. Your health care provider may prescribe medicine if lifestyle changes are not enough to get your blood pressure under control and if: Your systolic blood pressure is above 130. Your diastolic blood pressure is above 80. Your personal target blood pressure may vary depending on your medical conditions, your age, and other factors. Follow these instructions at home: Eating and drinking  Eat a diet that is high in fiber and potassium, and low in sodium, added sugar, and fat. An example of this eating plan is called the DASH diet. DASH stands for Dietary Approaches to Stop Hypertension. To eat this way: Eat   plenty of fresh fruits and vegetables. Try to fill one half of your plate at each meal with fruits and vegetables. Eat whole grains, such as whole-wheat pasta, brown rice, or whole-grain bread. Fill about one  fourth of your plate with whole grains. Eat or drink low-fat dairy products, such as skim milk or low-fat yogurt. Avoid fatty cuts of meat, processed or cured meats, and poultry with skin. Fill about one fourth of your plate with lean proteins, such as fish, chicken without skin, beans, eggs, or tofu. Avoid pre-made and processed foods. These tend to be higher in sodium, added sugar, and fat. Reduce your daily sodium intake. Many people with hypertension should eat less than 1,500 mg of sodium a day. Do not drink alcohol if: Your health care provider tells you not to drink. You are pregnant, may be pregnant, or are planning to become pregnant. If you drink alcohol: Limit how much you have to: 0-1 drink a day for women. 0-2 drinks a day for men. Know how much alcohol is in your drink. In the [place] one drink equals one 12 oz bottle of beer (355 mL), one 5 oz glass of wine (148 mL), or one 1 oz glass of hard liquor (44 mL). Lifestyle  Work with your health care provider to maintain a healthy body weight or to lose weight. Ask what an ideal weight is for you. Get at least 30 minutes of exercise that causes your heart to beat faster (aerobic exercise) most days of the week. Activities may include walking, swimming, or biking. Include exercise to strengthen your muscles (resistance exercise), such as Pilates or lifting weights, as part of your weekly exercise routine. Try to do these types of exercises for 30 minutes at least 3 days a week. Do not use any products that contain nicotine or tobacco. These products include cigarettes, chewing tobacco, and vaping devices, such as e-cigarettes. If you need help quitting, ask your health care provider. Monitor your blood pressure at home as told by your health care provider. Keep all follow-up visits. This is important. Medicines Take over-the-counter and prescription medicines only as told by your health care provider. Follow directions carefully. Blood  pressure medicines must be taken as prescribed. Do not skip doses of blood pressure medicine. Doing this puts you at risk for problems and can make the medicine less effective. Ask your health care provider about side effects or reactions to medicines that you should watch for. Contact a health care provider if you: Think you are having a reaction to a medicine you are taking. Have headaches that keep coming back (recurring). Feel dizzy. Have swelling in your ankles. Have trouble with your vision. Get help right away if you: Develop a severe headache or confusion. Have unusual weakness or numbness. Feel faint. Have severe pain in your chest or abdomen. Vomit repeatedly. Have trouble breathing. These symptoms may be an emergency. Get help right away. Call 911. Do not wait to see if the symptoms will go away. Do not drive yourself to the hospital. Summary Hypertension is when the force of blood pumping through your arteries is too strong. If this condition is not controlled, it may put you at risk for serious complications. Your personal target blood pressure may vary depending on your medical conditions, your age, and other factors. For most people, a normal blood pressure is less than 120/80. Hypertension is treated with lifestyle changes, medicines, or a combination of both. Lifestyle changes include losing weight, eating a healthy,   low-sodium diet, exercising more, and limiting alcohol. This information is not intended to replace advice given to you by your health care provider. Make sure you discuss any questions you have with your health care provider. Document Revised: 11/06/2020 Document Reviewed: 11/06/2020 [employer] Patient Education  2024 [employer]  

## 2022-11-04 NOTE — Assessment & Plan Note (Signed)
Clinically stable. See a psychiatrist on a regular basis Medications handled by psychiatrist office

## 2022-11-04 NOTE — Progress Notes (Signed)
Kristina Johnston 52 y.o.   Chief Complaint  Patient presents with   Medical Management of Chronic Issues    F/u for HTN and blood work is needed for medication refilled for haldol and Depakene    HISTORY OF PRESENT ILLNESS: This is a 52 y.o. female here for 88-month follow-up of hypertension Also needs blood work to get psychiatric medication refilled. Doing much better.  Eating better and losing weight. Has no complaints or medical concerns today.  HPI   Prior to Admission medications   Medication Sig Start Date End Date Taking? Authorizing Provider  amLODipine (NORVASC) 10 MG tablet TAKE 1 TABLET(10 MG) BY MOUTH DAILY 06/02/22  Yes Anshika Pethtel, Eilleen Kempf, MD  Ascorbic Acid (VITAMIN C PO) Take 1,000 mg by mouth daily.   Yes [provider]  Flaxseed, Linseed, (FLAXSEED OIL) 1000 MG CAPS Take 1,000 mg by mouth in the morning and at bedtime.   Yes [provider]  haloperidol (HALDOL) 2 MG/ML solution Take 4 mg by mouth at bedtime. 08/25/17  Yes [provider]  loratadine (CLARITIN) 10 MG tablet Take 10 mg by mouth daily.   Yes [provider]  Multiple Vitamin (MULTIVITAMIN PO) Take 1 tablet by mouth daily at 6 (six) AM.   Yes [provider]  Potassium 99 MG TABS Take 99 mg by mouth daily.   Yes [provider]  triamcinolone (KENALOG) 0.1 % APPLY TO SKIN TWICE A DAY FOR NO MORE THAN 2 WEEKS 11/29/19  Yes Sharaya Boruff, Eilleen Kempf, MD  Valproate Sodium (DEPAKENE) 250 MG/5ML SOLN solution Take 1,000 mg by mouth at bedtime.   Yes [provider]  estradiol (CLIMARA - DOSED IN MG/24 HR) 0.1 mg/24hr patch Place 1 patch (0.1 mg total) onto the skin once a week. Patient not taking: Reported on 05/05/2022 02/10/22   Genia Del, MD    No Known Allergies  Patient Active Problem List   Diagnosis Date Noted   Chronic insomnia 05/05/2022   Class 1 obesity due to excess calories without serious comorbidity with body mass index  (BMI) of 33.0 to 33.9 in adult 05/05/2022   Uterine fibroid 08/06/2021   Fibroid tumor 04/23/2020   Essential hypertension 06/06/2017   Bipolar disorder (HCC) 04/08/2017   Depression 03/17/2011    Past Medical History:  Diagnosis Date   Anemia    hx of   Anxiety    Bipolar disorder (HCC)    Depression    Hypertension    Paranoid schizophrenia (HCC)     Past Surgical History:  Procedure Laterality Date   DILATION AND CURETTAGE OF UTERUS  1995   pt had abortion and believes she had to have a D&E or D&C   HYSTERECTOMY ABDOMINAL WITH SALPINGECTOMY Bilateral 08/06/2021   Procedure: HYSTERECTOMY ABDOMINAL WITH BILATERAL SALPINGECTOMY;  Surgeon: Genia Del, MD;  Location: [facility] OR;  Service: Gynecology;  Laterality: Bilateral;   WISDOM TOOTH EXTRACTION      Social History   Socioeconomic History   Marital status: Divorced    Spouse name: Not on file   Number of children: 1   Years of education: Not on file   Highest education level: Bachelor's degree (e.g., BA, AB, BS)  Occupational History   Not on file  Tobacco Use   Smoking status: Never   Smokeless tobacco: Never  Vaping Use   Vaping status: Never Used  Substance and Sexual Activity   Alcohol use: No   Drug use: No   Sexual activity: Not Currently  Partners: Male    Birth control/protection: Surgical    Comment: 1st intercourse-19, partners- 4, hysterectomy  Other Topics Concern   Not on file  Social History Narrative   Not on file   Social Determinants of Health   Financial Resource Strain: Medium Risk (10/31/2022)   Overall Financial Resource Strain (CARDIA)    Difficulty of Paying Living Expenses: Somewhat hard  Food Insecurity: Food Insecurity Present (10/31/2022)   Hunger Vital Sign    Worried About Running Out of Food in the Last Year: Sometimes true    Ran Out of Food in the Last Year: Sometimes true  Transportation Needs: No Transportation Needs (10/31/2022)   PRAPARE - Therapist, art (Medical): No    Lack of Transportation (Non-Medical): No  Physical Activity: Insufficiently Active (10/31/2022)   Exercise Vital Sign    Days of Exercise per Week: 4 days    Minutes of Exercise per Session: 30 min  Stress: No Stress Concern Present (10/31/2022)   [employer] of Occupational Health - Occupational Stress Questionnaire    Feeling of Stress : Only a little  Social Connections: Unknown (10/31/2022)   Social Connection and Isolation Panel [NHANES]    Frequency of Communication with Friends and Family: Patient declined    Frequency of Social Gatherings with Friends and Family: Patient declined    Attends Religious Services: Never    Database administrator or Organizations: No    Attends Engineer, structural: Not on file    Marital Status: Divorced  Intimate Partner Violence: Not At Risk (06/13/2021)   Humiliation, Afraid, Rape, and Kick questionnaire    Fear of Current or Ex-Partner: No    Emotionally Abused: No    Physically Abused: No    Sexually Abused: No    Family History  Problem Relation Age of Onset   Heart disease Mother    Hyperlipidemia Mother    Mental illness Mother    Diabetes Father    Hypertension Sister    Hyperlipidemia Brother    Hypertension Brother    Cancer Maternal Grandmother    Cancer Maternal Grandfather    Hyperlipidemia Maternal Grandfather    Hypertension Maternal Grandfather    Breast cancer Maternal Grandfather    Cancer Paternal Grandmother 63       breast cancer   Hyperlipidemia Paternal Grandfather    Hypertension Paternal Grandfather    Cancer Maternal Aunt 40       breast cancer   Breast cancer Maternal Aunt    Breast cancer Cousin      Review of Systems  Constitutional: Negative.  Negative for chills and fever.  HENT: Negative.  Negative for congestion and sore throat.   Respiratory: Negative.  Negative for cough and shortness of breath.   Cardiovascular: Negative.  Negative for  chest pain and palpitations.  Gastrointestinal:  Negative for abdominal pain, diarrhea, nausea and vomiting.  Genitourinary: Negative.  Negative for dysuria and hematuria.  Skin: Negative.  Negative for rash.  Neurological: Negative.  Negative for dizziness and headaches.  All other systems reviewed and are negative.   Today's Vitals   11/04/22 0814  BP: 124/84  Pulse: 78  Temp: 98.2 F (36.8 C)  TempSrc: Oral  SpO2: 96%  Weight: 187 lb 6.4 oz (85 kg)  Height: 5\' 5"  (1.651 m)   Body mass index is 31.18 kg/m.   Physical Exam Vitals reviewed.  Constitutional:      Appearance: Normal appearance.  HENT:     Head: Normocephalic.     Mouth/Throat:     Mouth: Mucous membranes are moist.     Pharynx: Oropharynx is clear.  Eyes:     Extraocular Movements: Extraocular movements intact.     Conjunctiva/sclera: Conjunctivae normal.     Pupils: Pupils are equal, round, and reactive to light.  Cardiovascular:     Rate and Rhythm: Normal rate and regular rhythm.     Pulses: Normal pulses.     Heart sounds: Normal heart sounds.  Pulmonary:     Effort: Pulmonary effort is normal.     Breath sounds: Normal breath sounds.  Musculoskeletal:     Cervical back: No tenderness.  Lymphadenopathy:     Cervical: No cervical adenopathy.  Skin:    General: Skin is warm and dry.     Capillary Refill: Capillary refill takes less than 2 seconds.  Neurological:     General: No focal deficit present.     Mental Status: She is alert and oriented to person, place, and time.  Psychiatric:        Mood and Affect: Mood normal.        Behavior: Behavior normal.      ASSESSMENT & PLAN: A total of 42 minutes was spent with the patient and counseling/coordination of care regarding preparing for this visit, review of most recent office visit notes, review of most recent blood work results, diagnosis of hypertension and cardiovascular risks associated with this condition, review of all medications,  review of health maintenance items and need for colon cancer and breast cancer screening, education on nutrition, prognosis, documentation and need for follow-up.  Problem List Items Addressed This Visit       Cardiovascular and Mediastinum   Essential hypertension - Primary    BP Readings from Last 3 Encounters:  11/04/22 124/84  05/05/22 128/72  02/10/22 124/82  Well-controlled hypertension Continue amlodipine 10 mg daily Cardiovascular risks associated with hypertension discussed Diet and nutrition discussed Benefits of exercise discussed Blood work done today Follow-up in 6 months       Relevant Orders   CBC with Differential/Platelet   Comprehensive metabolic panel   Lipid panel     Other   Bipolar disorder (HCC)    Clinically stable. See a psychiatrist on a regular basis Medications handled by psychiatrist office      Class 1 obesity due to excess calories without serious comorbidity with body mass index (BMI) of 33.0 to 33.9 in adult    Wt Readings from Last 3 Encounters:  11/04/22 187 lb 6.4 oz (85 kg)  05/05/22 198 lb 8 oz (90 kg)  02/10/22 187 lb (84.8 kg)  Exercising more frequently Eating better and losing weight       Other Visit Diagnoses     Screening for colon cancer       Relevant Orders   Ambulatory referral to Gastroenterology   Screening mammogram for breast cancer       Relevant Orders   MM Digital Screening        Patient Instructions  Hypertension, Adult High blood pressure (hypertension) is when the force of blood pumping through the arteries is too strong. The arteries are the blood vessels that carry blood from the heart throughout the body. Hypertension forces the heart to work harder to pump blood and may cause arteries to become narrow or stiff. Untreated or uncontrolled hypertension can lead to a heart attack, heart failure, a stroke, kidney disease, and  other problems. A blood pressure reading consists of a higher number over  a lower number. Ideally, your blood pressure should be below 120/80. The first ("top") number is called the systolic pressure. It is a measure of the pressure in your arteries as your heart beats. The second ("bottom") number is called the diastolic pressure. It is a measure of the pressure in your arteries as the heart relaxes. What are the causes? The exact cause of this condition is not known. There are some conditions that result in high blood pressure. What increases the risk? Certain factors may make you more likely to develop high blood pressure. Some of these risk factors are under your control, including: Smoking. Not getting enough exercise or physical activity. Being overweight. Having too much fat, sugar, calories, or salt (sodium) in your diet. Drinking too much alcohol. Other risk factors include: Having a personal history of heart disease, diabetes, high cholesterol, or kidney disease. Stress. Having a family history of high blood pressure and high cholesterol. Having obstructive sleep apnea. Age. The risk increases with age. What are the signs or symptoms? High blood pressure may not cause symptoms. Very high blood pressure (hypertensive crisis) may cause: Headache. Fast or irregular heartbeats (palpitations). Shortness of breath. Nosebleed. Nausea and vomiting. Vision changes. Severe chest pain, dizziness, and seizures. How is this diagnosed? This condition is diagnosed by measuring your blood pressure while you are seated, with your arm resting on a flat surface, your legs uncrossed, and your feet flat on the floor. The cuff of the blood pressure monitor will be placed directly against the skin of your upper arm at the level of your heart. Blood pressure should be measured at least twice using the same arm. Certain conditions can cause a difference in blood pressure between your right and left arms. If you have a high blood pressure reading during one visit or you have  normal blood pressure with other risk factors, you may be asked to: Return on a different day to have your blood pressure checked again. Monitor your blood pressure at home for 1 week or longer. If you are diagnosed with hypertension, you may have other blood or imaging tests to help your health care provider understand your overall risk for other conditions. How is this treated? This condition is treated by making healthy lifestyle changes, such as eating healthy foods, exercising more, and reducing your alcohol intake. You may be referred for counseling on a healthy diet and physical activity. Your health care provider may prescribe medicine if lifestyle changes are not enough to get your blood pressure under control and if: Your systolic blood pressure is above 130. Your diastolic blood pressure is above 80. Your personal target blood pressure may vary depending on your medical conditions, your age, and other factors. Follow these instructions at home: Eating and drinking  Eat a diet that is high in fiber and potassium, and low in sodium, added sugar, and fat. An example of this eating plan is called the DASH diet. DASH stands for Dietary Approaches to Stop Hypertension. To eat this way: Eat plenty of fresh fruits and vegetables. Try to fill one half of your plate at each meal with fruits and vegetables. Eat whole grains, such as whole-wheat pasta, brown rice, or whole-grain bread. Fill about one fourth of your plate with whole grains. Eat or drink low-fat dairy products, such as skim milk or low-fat yogurt. Avoid fatty cuts of meat, processed or cured meats, and poultry with skin.  Fill about one fourth of your plate with lean proteins, such as fish, chicken without skin, beans, eggs, or tofu. Avoid pre-made and processed foods. These tend to be higher in sodium, added sugar, and fat. Reduce your daily sodium intake. Many people with hypertension should eat less than 1,500 mg of sodium a  day. Do not drink alcohol if: Your health care provider tells you not to drink. You are pregnant, may be pregnant, or are planning to become pregnant. If you drink alcohol: Limit how much you have to: 0-1 drink a day for women. 0-2 drinks a day for men. Know how much alcohol is in your drink. In the [place] one drink equals one 12 oz bottle of beer (355 mL), one 5 oz glass of wine (148 mL), or one 1 oz glass of hard liquor (44 mL). Lifestyle  Work with your health care provider to maintain a healthy body weight or to lose weight. Ask what an ideal weight is for you. Get at least 30 minutes of exercise that causes your heart to beat faster (aerobic exercise) most days of the week. Activities may include walking, swimming, or biking. Include exercise to strengthen your muscles (resistance exercise), such as Pilates or lifting weights, as part of your weekly exercise routine. Try to do these types of exercises for 30 minutes at least 3 days a week. Do not use any products that contain nicotine or tobacco. These products include cigarettes, chewing tobacco, and vaping devices, such as e-cigarettes. If you need help quitting, ask your health care provider. Monitor your blood pressure at home as told by your health care provider. Keep all follow-up visits. This is important. Medicines Take over-the-counter and prescription medicines only as told by your health care provider. Follow directions carefully. Blood pressure medicines must be taken as prescribed. Do not skip doses of blood pressure medicine. Doing this puts you at risk for problems and can make the medicine less effective. Ask your health care provider about side effects or reactions to medicines that you should watch for. Contact a health care provider if you: Think you are having a reaction to a medicine you are taking. Have headaches that keep coming back (recurring). Feel dizzy. Have swelling in your ankles. Have trouble with your  vision. Get help right away if you: Develop a severe headache or confusion. Have unusual weakness or numbness. Feel faint. Have severe pain in your chest or abdomen. Vomit repeatedly. Have trouble breathing. These symptoms may be an emergency. Get help right away. Call 911. Do not wait to see if the symptoms will go away. Do not drive yourself to the hospital. Summary Hypertension is when the force of blood pumping through your arteries is too strong. If this condition is not controlled, it may put you at risk for serious complications. Your personal target blood pressure may vary depending on your medical conditions, your age, and other factors. For most people, a normal blood pressure is less than 120/80. Hypertension is treated with lifestyle changes, medicines, or a combination of both. Lifestyle changes include losing weight, eating a healthy, low-sodium diet, exercising more, and limiting alcohol. This information is not intended to replace advice given to you by your health care provider. Make sure you discuss any questions you have with your health care provider. Document Revised: 11/06/2020 Document Reviewed: 11/06/2020 Elsevier Patient Education  2024 [employer]    Edwina Barth, MD [facility]

## 2022-11-04 NOTE — Assessment & Plan Note (Signed)
Wt Readings from Last 3 Encounters:  11/04/22 187 lb 6.4 oz (85 kg)  05/05/22 198 lb 8 oz (90 kg)  02/10/22 187 lb (84.8 kg)  Exercising more frequently Eating better and losing weight

## 2023-05-06 ENCOUNTER — Ambulatory Visit: Payer: Medicare Other | Admitting: Emergency Medicine

## 2023-05-06 ENCOUNTER — Encounter: Payer: Self-pay | Admitting: Emergency Medicine

## 2023-05-06 VITALS — BP 124/82 | HR 89 | Temp 98.7°F | Ht 65.0 in | Wt 192.0 lb

## 2023-05-06 DIAGNOSIS — I10 Essential (primary) hypertension: Secondary | ICD-10-CM | POA: Diagnosis not present

## 2023-05-06 DIAGNOSIS — Z13220 Encounter for screening for lipoid disorders: Secondary | ICD-10-CM

## 2023-05-06 DIAGNOSIS — F3176 Bipolar disorder, in full remission, most recent episode depressed: Secondary | ICD-10-CM | POA: Diagnosis not present

## 2023-05-06 DIAGNOSIS — Z131 Encounter for screening for diabetes mellitus: Secondary | ICD-10-CM

## 2023-05-06 DIAGNOSIS — Z1211 Encounter for screening for malignant neoplasm of colon: Secondary | ICD-10-CM

## 2023-05-06 LAB — CBC WITH DIFFERENTIAL/PLATELET
Basophils Absolute: 0 10*3/uL (ref 0.0–0.1)
Basophils Relative: 1.1 % (ref 0.0–3.0)
Eosinophils Absolute: 0.1 10*3/uL (ref 0.0–0.7)
Eosinophils Relative: 1.6 % (ref 0.0–5.0)
HCT: 41.1 % (ref 36.0–46.0)
Hemoglobin: 13.7 g/dL (ref 12.0–15.0)
Lymphocytes Relative: 45.9 % (ref 12.0–46.0)
Lymphs Abs: 1.8 10*3/uL (ref 0.7–4.0)
MCHC: 33.3 g/dL (ref 30.0–36.0)
MCV: 89.1 fl (ref 78.0–100.0)
Monocytes Absolute: 0.2 10*3/uL (ref 0.1–1.0)
Monocytes Relative: 6.5 % (ref 3.0–12.0)
Neutro Abs: 1.7 10*3/uL (ref 1.4–7.7)
Neutrophils Relative %: 44.9 % (ref 43.0–77.0)
Platelets: 250 10*3/uL (ref 150.0–400.0)
RBC: 4.62 Mil/uL (ref 3.87–5.11)
RDW: 14 % (ref 11.5–15.5)
WBC: 3.8 10*3/uL — ABNORMAL LOW (ref 4.0–10.5)

## 2023-05-06 LAB — LIPID PANEL
Cholesterol: 173 mg/dL (ref 0–200)
HDL: 58.1 mg/dL (ref >39.00)
LDL Cholesterol: 85 mg/dL (ref 0–99)
NonHDL: 115.35
Total CHOL/HDL Ratio: 3
Triglycerides: 150 mg/dL — ABNORMAL HIGH (ref 0.0–149.0)
VLDL: 30 mg/dL (ref 0.0–40.0)

## 2023-05-06 LAB — COMPREHENSIVE METABOLIC PANEL WITH GFR
ALT: 23 U/L (ref 0–35)
AST: 23 U/L (ref 0–37)
Albumin: 4.9 g/dL (ref 3.5–5.2)
Alkaline Phosphatase: 78 U/L (ref 39–117)
BUN: 15 mg/dL (ref 6–23)
CO2: 29 meq/L (ref 19–32)
Calcium: 10.2 mg/dL (ref 8.4–10.5)
Chloride: 103 meq/L (ref 96–112)
Creatinine, Ser: 0.72 mg/dL (ref 0.40–1.20)
GFR: 94.78 mL/min (ref >60.00)
Glucose, Bld: 97 mg/dL (ref 70–99)
Potassium: 4 meq/L (ref 3.5–5.1)
Sodium: 140 meq/L (ref 135–145)
Total Bilirubin: 0.5 mg/dL (ref 0.2–1.2)
Total Protein: 8.2 g/dL (ref 6.0–8.3)

## 2023-05-06 LAB — HEMOGLOBIN A1C: Hgb A1c MFr Bld: 5.6 % (ref 4.6–6.5)

## 2023-05-06 NOTE — Patient Instructions (Signed)

## 2023-05-06 NOTE — Progress Notes (Signed)
 Kristina Johnston 53 y.o.   Chief Complaint  Patient presents with   Follow-up    6 month f/u for HTN. Patient wants blood work done so she able to get her meds from her psyc. Needs a medical release forms for her psychiatrist for Dr. Daphine Eagle     HISTORY OF PRESENT ILLNESS: This is a 53 y.o. female here for 71-month follow-up of chronic medical conditions. Overall doing well.  Has no complaints or medical concerns today.  HPI   Prior to Admission medications   Medication Sig Start Date End Date Taking? Authorizing Provider  amLODipine  (NORVASC ) 10 MG tablet TAKE 1 TABLET(10 MG) BY MOUTH DAILY 06/02/22  Yes Letroy Vazguez, Isidro Margo, MD  Ascorbic Acid (VITAMIN C PO) Take 1,000 mg by mouth daily.   Yes [provider]  Flaxseed, Linseed, (FLAXSEED OIL) 1000 MG CAPS Take 1,000 mg by mouth in the morning and at bedtime.   Yes [provider]  haloperidol (HALDOL) 2 MG/ML solution Take 4 mg by mouth at bedtime. 08/25/17  Yes [provider]  loratadine (CLARITIN) 10 MG tablet Take 10 mg by mouth daily.   Yes [provider]  Multiple Vitamin (MULTIVITAMIN PO) Take 1 tablet by mouth daily at 6 (six) AM.   Yes [provider]  Potassium 99 MG TABS Take 99 mg by mouth daily.   Yes [provider]  triamcinolone  (KENALOG ) 0.1 % APPLY TO SKIN TWICE A DAY FOR NO MORE THAN 2 WEEKS 11/29/19  Yes Priyana Mccarey, Isidro Margo, MD  Valproate Sodium (DEPAKENE ) 250 MG/5ML SOLN solution Take 1,000 mg by mouth at bedtime.   Yes [provider]  estradiol  (CLIMARA  - DOSED IN MG/24 HR) 0.1 mg/24hr patch Place 1 patch (0.1 mg total) onto the skin once a week. Patient not taking: Reported on 05/06/2023 02/10/22   Lavoie, Marie-Lyne, MD    No Known Allergies  Patient Active Problem List   Diagnosis Date Noted   Chronic insomnia 05/05/2022   Class 1 obesity due to excess calories without serious comorbidity with body mass index (BMI) of 33.0 to 33.9 in  adult 05/05/2022   Uterine fibroid 08/06/2021   Fibroid tumor 04/23/2020   Essential hypertension 06/06/2017   Bipolar disorder (HCC) 04/08/2017   Depression 03/17/2011    Past Medical History:  Diagnosis Date   Anemia    hx of   Anxiety    Bipolar disorder (HCC)    Depression    Hypertension    Paranoid schizophrenia (HCC)     Past Surgical History:  Procedure Laterality Date   DILATION AND CURETTAGE OF UTERUS  1995   pt had abortion and believes she had to have a D&E or D&C   HYSTERECTOMY ABDOMINAL WITH SALPINGECTOMY Bilateral 08/06/2021   Procedure: HYSTERECTOMY ABDOMINAL WITH BILATERAL SALPINGECTOMY;  Surgeon: Lavoie, Marie-Lyne, MD;  Location: [facility] OR;  Service: Gynecology;  Laterality: Bilateral;   WISDOM TOOTH EXTRACTION      Social History   Socioeconomic History   Marital status: Divorced    Spouse name: Not on file   Number of children: 1   Years of education: Not on file   Highest education level: Bachelor's degree (e.g., BA, AB, BS)  Occupational History   Not on file  Tobacco Use   Smoking status: Never   Smokeless tobacco: Never  Vaping Use   Vaping status: Never Used  Substance and Sexual Activity   Alcohol use: No   Drug use: No   Sexual activity:  Not Currently    Partners: Male    Birth control/protection: Surgical    Comment: 1st intercourse-19, partners- 4, hysterectomy  Other Topics Concern   Not on file  Social History Narrative   Not on file   Social Drivers of Health   Financial Resource Strain: Medium Risk (05/02/2023)   Overall Financial Resource Strain (CARDIA)    Difficulty of Paying Living Expenses: Somewhat hard  Food Insecurity: Food Insecurity Present (05/02/2023)   Hunger Vital Sign    Worried About Running Out of Food in the Last Year: Sometimes true    Ran Out of Food in the Last Year: Sometimes true  Transportation Needs: No Transportation Needs (05/02/2023)   PRAPARE - Administrator, Civil Service (Medical):  No    Lack of Transportation (Non-Medical): No  Physical Activity: Insufficiently Active (05/02/2023)   Exercise Vital Sign    Days of Exercise per Week: 3 days    Minutes of Exercise per Session: 30 min  Stress: Stress Concern Present (05/02/2023)   [employer] of Occupational Health - Occupational Stress Questionnaire    Feeling of Stress : Rather much  Social Connections: Socially Isolated (05/02/2023)   Social Connection and Isolation Panel [NHANES]    Frequency of Communication with Friends and Family: Once a week    Frequency of Social Gatherings with Friends and Family: Never    Attends Religious Services: Never    Database administrator or Organizations: No    Attends Engineer, structural: Not on file    Marital Status: Divorced  Intimate Partner Violence: Not At Risk (06/13/2021)   Humiliation, Afraid, Rape, and Kick questionnaire    Fear of Current or Ex-Partner: No    Emotionally Abused: No    Physically Abused: No    Sexually Abused: No    Family History  Problem Relation Age of Onset   Heart disease Mother    Hyperlipidemia Mother    Mental illness Mother    Diabetes Father    Hypertension Sister    Hyperlipidemia Brother    Hypertension Brother    Cancer Maternal Grandmother    Cancer Maternal Grandfather    Hyperlipidemia Maternal Grandfather    Hypertension Maternal Grandfather    Breast cancer Maternal Grandfather    Cancer Paternal Grandmother 37       breast cancer   Hyperlipidemia Paternal Grandfather    Hypertension Paternal Grandfather    Cancer Maternal Aunt 40       breast cancer   Breast cancer Maternal Aunt    Breast cancer Cousin      Review of Systems  Constitutional: Negative.  Negative for chills and fever.  HENT: Negative.  Negative for congestion and sore throat.   Respiratory: Negative.  Negative for cough and shortness of breath.   Cardiovascular: Negative.  Negative for chest pain and palpitations.  Gastrointestinal:   Negative for abdominal pain, diarrhea, nausea and vomiting.  Genitourinary: Negative.  Negative for dysuria and hematuria.  Skin: Negative.  Negative for rash.  Neurological: Negative.  Negative for dizziness and headaches.  All other systems reviewed and are negative.   Vitals:   05/06/23 0859  BP: 124/82  Pulse: 89  Temp: 98.7 F (37.1 C)  SpO2: 95%    Physical Exam Vitals reviewed.  Constitutional:      Appearance: Normal appearance.  HENT:     Head: Normocephalic.     Mouth/Throat:     Mouth: Mucous membranes are moist.  Pharynx: Oropharynx is clear.  Eyes:     Extraocular Movements: Extraocular movements intact.     Pupils: Pupils are equal, round, and reactive to light.  Cardiovascular:     Rate and Rhythm: Normal rate.     Pulses: Normal pulses.     Heart sounds: Normal heart sounds.  Pulmonary:     Effort: Pulmonary effort is normal.     Breath sounds: Normal breath sounds.  Musculoskeletal:     Cervical back: No tenderness.  Lymphadenopathy:     Cervical: No cervical adenopathy.  Skin:    General: Skin is warm and dry.     Capillary Refill: Capillary refill takes less than 2 seconds.  Neurological:     General: No focal deficit present.     Mental Status: She is alert and oriented to person, place, and time.  Psychiatric:        Mood and Affect: Mood normal.        Behavior: Behavior normal.      ASSESSMENT & PLAN: A total of 44 minutes was spent with the patient and counseling/coordination of care regarding preparing for this visit, review of most recent office visit notes, review of multiple chronic medical conditions and their management, cardiovascular risks associated with hypertension, review of all medications, review of most recent bloodwork results, review of health maintenance items, education on nutrition, prognosis, documentation, and need for follow up.   Problem List Items Addressed This Visit       Cardiovascular and Mediastinum    Essential hypertension - Primary   BP Readings from Last 3 Encounters:  05/06/23 124/82  11/04/22 124/84  05/05/22 128/72  Well-controlled hypertension Continue amlodipine  10 mg daily Cardiovascular risks associated with hypertension discussed Diet and nutrition discussed Benefits of exercise discussed Blood work done today Follow-up in 6 months       Relevant Orders   CBC with Differential/Platelet   Comprehensive metabolic panel with GFR   Hemoglobin A1c   Lipid panel     Other   Bipolar disorder (HCC)   Clinically stable. See a psychiatrist on a regular basis Medications handled by psychiatrist office      Other Visit Diagnoses       Screening for colon cancer       Relevant Orders   Ambulatory referral to Gastroenterology        Patient Instructions  Health Maintenance, Female Adopting a healthy lifestyle and getting preventive care are important in promoting health and wellness. Ask your health care provider about: The right schedule for you to have regular tests and exams. Things you can do on your own to prevent diseases and keep yourself healthy. What should I know about diet, weight, and exercise? Eat a healthy diet  Eat a diet that includes plenty of vegetables, fruits, low-fat dairy products, and lean protein. Do not eat a lot of foods that are high in solid fats, added sugars, or sodium. Maintain a healthy weight Body mass index (BMI) is used to identify weight problems. It estimates body fat based on height and weight. Your health care provider can help determine your BMI and help you achieve or maintain a healthy weight. Get regular exercise Get regular exercise. This is one of the most important things you can do for your health. Most adults should: Exercise for at least 150 minutes each week. The exercise should increase your heart rate and make you sweat (moderate-intensity exercise). Do strengthening exercises at least twice a week. This is  in  addition to the moderate-intensity exercise. Spend less time sitting. Even light physical activity can be beneficial. Watch cholesterol and blood lipids Have your blood tested for lipids and cholesterol at 55 years of age, then have this test every 5 years. Have your cholesterol levels checked more often if: Your lipid or cholesterol levels are high. You are older than 55 years of age. You are at high risk for heart disease. What should I know about cancer screening? Depending on your health history and family history, you may need to have cancer screening at various ages. This may include screening for: Breast cancer. Cervical cancer. Colorectal cancer. Skin cancer. Lung cancer. What should I know about heart disease, diabetes, and high blood pressure? Blood pressure and heart disease High blood pressure causes heart disease and increases the risk of stroke. This is more likely to develop in people who have high blood pressure readings or are overweight. Have your blood pressure checked: Every 3-5 years if you are 66-55 years of age. Every year if you are 71 years old or older. Diabetes Have regular diabetes screenings. This checks your fasting blood sugar level. Have the screening done: Once every three years after age 93 if you are at a normal weight and have a low risk for diabetes. More often and at a younger age if you are overweight or have a high risk for diabetes. What should I know about preventing infection? Hepatitis B If you have a higher risk for hepatitis B, you should be screened for this virus. Talk with your health care provider to find out if you are at risk for hepatitis B infection. Hepatitis C Testing is recommended for: Everyone born from [birth year] through 1967. Anyone with known risk factors for hepatitis C. Sexually transmitted infections (STIs) Get screened for STIs, including gonorrhea and chlamydia, if: You are sexually active and are younger than 55 years of  age. You are older than 55 years of age and your health care provider tells you that you are at risk for this type of infection. Your sexual activity has changed since you were last screened, and you are at increased risk for chlamydia or gonorrhea. Ask your health care provider if you are at risk. Ask your health care provider about whether you are at high risk for HIV. Your health care provider may recommend a prescription medicine to help prevent HIV infection. If you choose to take medicine to prevent HIV, you should first get tested for HIV. You should then be tested every 3 months for as long as you are taking the medicine. Pregnancy If you are about to stop having your period (premenopausal) and you may become pregnant, seek counseling before you get pregnant. Take 400 to 800 micrograms (mcg) of folic acid  every day if you become pregnant. Ask for birth control (contraception) if you want to prevent pregnancy. Osteoporosis and menopause Osteoporosis is a disease in which the bones lose minerals and strength with aging. This can result in bone fractures. If you are 27 years old or older, or if you are at risk for osteoporosis and fractures, ask your health care provider if you should: Be screened for bone loss. Take a calcium or vitamin D supplement to lower your risk of fractures. Be given hormone replacement therapy (HRT) to treat symptoms of menopause. Follow these instructions at home: Alcohol use Do not drink alcohol if: Your health care provider tells you not to drink. You are pregnant, may be pregnant, or are  planning to become pregnant. If you drink alcohol: Limit how much you have to: 0-1 drink a day. Know how much alcohol is in your drink. In the [place] one drink equals one 12 oz bottle of beer (355 mL), one 5 oz glass of wine (148 mL), or one 1 oz glass of hard liquor (44 mL). Lifestyle Do not use any products that contain nicotine or tobacco. These products include  cigarettes, chewing tobacco, and vaping devices, such as e-cigarettes. If you need help quitting, ask your health care provider. Do not use street drugs. Do not share needles. Ask your health care provider for help if you need support or information about quitting drugs. General instructions Schedule regular health, dental, and eye exams. Stay current with your vaccines. Tell your health care provider if: You often feel depressed. You have ever been abused or do not feel safe at home. Summary Adopting a healthy lifestyle and getting preventive care are important in promoting health and wellness. Follow your health care provider's instructions about healthy diet, exercising, and getting tested or screened for diseases. Follow your health care provider's instructions on monitoring your cholesterol and blood pressure. This information is not intended to replace advice given to you by your health care provider. Make sure you discuss any questions you have with your health care provider. Document Revised: 05/21/2020 Document Reviewed: 05/21/2020 Elsevier Patient Education  2024 [employer]    Maryagnes Small, MD [facility]

## 2023-05-06 NOTE — Assessment & Plan Note (Signed)
Clinically stable. See a psychiatrist on a regular basis Medications handled by psychiatrist office

## 2023-05-06 NOTE — Assessment & Plan Note (Signed)
 BP Readings from Last 3 Encounters:  05/06/23 124/82  11/04/22 124/84  05/05/22 128/72  Well-controlled hypertension Continue amlodipine  10 mg daily Cardiovascular risks associated with hypertension discussed Diet and nutrition discussed Benefits of exercise discussed Blood work done today Follow-up in 6 months

## 2023-06-01 ENCOUNTER — Other Ambulatory Visit: Payer: Self-pay | Admitting: Emergency Medicine

## 2023-06-01 DIAGNOSIS — I10 Essential (primary) hypertension: Secondary | ICD-10-CM

## 2023-06-09 ENCOUNTER — Ambulatory Visit (INDEPENDENT_AMBULATORY_CARE_PROVIDER_SITE_OTHER)

## 2023-06-09 VITALS — Ht 65.0 in | Wt 192.0 lb

## 2023-06-09 DIAGNOSIS — Z1231 Encounter for screening mammogram for malignant neoplasm of breast: Secondary | ICD-10-CM

## 2023-06-09 DIAGNOSIS — Z124 Encounter for screening for malignant neoplasm of cervix: Secondary | ICD-10-CM

## 2023-06-09 DIAGNOSIS — Z5320 Procedure and treatment not carried out because of patient's decision for unspecified reasons: Secondary | ICD-10-CM

## 2023-06-09 DIAGNOSIS — Z1211 Encounter for screening for malignant neoplasm of colon: Secondary | ICD-10-CM | POA: Diagnosis not present

## 2023-06-09 DIAGNOSIS — Z0000 Encounter for general adult medical examination without abnormal findings: Secondary | ICD-10-CM | POA: Diagnosis not present

## 2023-06-09 NOTE — Progress Notes (Signed)
 Subjective:   Kristina Johnston is a 53 y.o. who presents for a Medicare Wellness preventive visit.  As a reminder, Annual Wellness Visits don't include a physical exam, and some assessments may be limited, especially if this visit is performed virtually. We may recommend an in-person follow-up visit with your provider if needed.  Visit Complete: Virtual I connected with  Kristina Johnston on 06/09/23 by a audio enabled telemedicine application and verified that I am speaking with the correct person using two identifiers.  Patient Location: Home  Provider Location: Office/Clinic  I discussed the limitations of evaluation and management by telemedicine. The patient expressed understanding and agreed to proceed.  Vital Signs: Because this visit was a virtual/telehealth visit, some criteria may be missing or patient reported. Any vitals not documented were not able to be obtained and vitals that have been documented are patient reported.  VideoDeclined- This patient declined Librarian, academic. Therefore the visit was completed with audio only.  Persons Participating in Visit: Patient.  AWV Questionnaire: No: Patient Medicare AWV questionnaire was not completed prior to this visit.  Cardiac Risk Factors include: advanced age (>7men, >66 women);hypertension;obesity (BMI >30kg/m2)     Objective:     Today's Vitals   06/09/23 1135  Weight: 192 lb (87.1 kg)  Height: 5\' 5"  (1.651 m)   Body mass index is 31.95 kg/m.     06/09/2023   11:35 AM 08/06/2021    6:55 AM 07/31/2021    9:13 AM 06/13/2021    9:42 AM 08/31/2020    7:14 AM 02/11/2019    2:07 PM 09/12/2017    3:24 PM  Advanced Directives  Does Patient Have a Medical Advance Directive? Yes Yes Yes Yes Yes Yes No  Type of Estate agent of [place];Living will Healthcare Power of [place];Living will Healthcare Power of [place];Living will Living will Healthcare Power of [place];Living  will Healthcare Power of Attorney   Does patient want to make changes to medical advance directive? No - Patient declined No - Patient declined No - Patient declined   Yes (Inpatient - patient defers changing a medical advance directive and declines information at this time)   Copy of Healthcare Power of Attorney in Chart? Yes - validated most recent copy scanned in chart (See row information) No - copy requested No - copy requested   No - copy requested   Would patient like information on creating a medical advance directive?       No - Patient declined    Current Medications (verified) Outpatient Encounter Medications as of 06/09/2023  Medication Sig   amLODipine  (NORVASC ) 10 MG tablet TAKE 1 TABLET(10 MG) BY MOUTH DAILY   Ascorbic Acid (VITAMIN C PO) Take 1,000 mg by mouth daily.   estradiol  (CLIMARA  - DOSED IN MG/24 HR) 0.1 mg/24hr patch Place 1 patch (0.1 mg total) onto the skin once a week.   Flaxseed, Linseed, (FLAXSEED OIL) 1000 MG CAPS Take 1,000 mg by mouth in the morning and at bedtime.   haloperidol (HALDOL) 2 MG/ML solution Take 4 mg by mouth at bedtime.   loratadine (CLARITIN) 10 MG tablet Take 10 mg by mouth daily.   Multiple Vitamin (MULTIVITAMIN PO) Take 1 tablet by mouth daily at 6 (six) AM.   Potassium 99 MG TABS Take 99 mg by mouth daily.   triamcinolone  (KENALOG ) 0.1 % APPLY TO SKIN TWICE A DAY FOR NO MORE THAN 2 WEEKS   Valproate Sodium (DEPAKENE ) 250 MG/5ML SOLN solution Take  1,000 mg by mouth at bedtime.   No facility-administered encounter medications on file as of 06/09/2023.    Allergies (verified) Patient has no known allergies.   History: Past Medical History:  Diagnosis Date   Anemia    hx of   Anxiety    Bipolar disorder (HCC)    Depression    Hypertension    Paranoid schizophrenia (HCC)    Past Surgical History:  Procedure Laterality Date   DILATION AND CURETTAGE OF UTERUS  1995   pt had abortion and believes she had to have a D&E or D&C    HYSTERECTOMY ABDOMINAL WITH SALPINGECTOMY Bilateral 08/06/2021   Procedure: HYSTERECTOMY ABDOMINAL WITH BILATERAL SALPINGECTOMY;  Surgeon: Lavoie, Marie-Lyne, MD;  Location: [facility] OR;  Service: Gynecology;  Laterality: Bilateral;   WISDOM TOOTH EXTRACTION     Family History  Problem Relation Age of Onset   Heart disease Mother    Hyperlipidemia Mother    Mental illness Mother    Diabetes Father    Hypertension Sister    Hyperlipidemia Brother    Hypertension Brother    Cancer Maternal Grandmother    Cancer Maternal Grandfather    Hyperlipidemia Maternal Grandfather    Hypertension Maternal Grandfather    Breast cancer Maternal Grandfather    Cancer Paternal Grandmother 46       breast cancer   Hyperlipidemia Paternal Grandfather    Hypertension Paternal Grandfather    Cancer Maternal Aunt 40       breast cancer   Breast cancer Maternal Aunt    Breast cancer Cousin    Social History   Socioeconomic History   Marital status: Divorced    Spouse name: Not on file   Number of children: 1   Years of education: Not on file   Highest education level: Bachelor's degree (e.g., BA, AB, BS)  Occupational History   Not on file  Tobacco Use   Smoking status: Never    Passive exposure: Never   Smokeless tobacco: Never  Vaping Use   Vaping status: Never Used  Substance and Sexual Activity   Alcohol use: No   Drug use: No   Sexual activity: Not Currently    Partners: Male    Birth control/protection: Surgical    Comment: 1st intercourse-19, partners- 4, hysterectomy  Other Topics Concern   Not on file  Social History Narrative   Not on file   Social Drivers of Health   Financial Resource Strain: Low Risk  (06/09/2023)   Overall Financial Resource Strain (CARDIA)    Difficulty of Paying Living Expenses: Not very hard  Recent Concern: Financial Resource Strain - Medium Risk (05/02/2023)   Overall Financial Resource Strain (CARDIA)    Difficulty of Paying Living Expenses: Somewhat  hard  Food Insecurity: Food Insecurity Present (06/09/2023)   Hunger Vital Sign    Worried About Running Out of Food in the Last Year: Sometimes true    Ran Out of Food in the Last Year: Never true  Transportation Needs: No Transportation Needs (06/09/2023)   PRAPARE - Administrator, Civil Service (Medical): No    Lack of Transportation (Non-Medical): No  Physical Activity: Insufficiently Active (06/09/2023)   Exercise Vital Sign    Days of Exercise per Week: 2 days    Minutes of Exercise per Session: 60 min  Stress: No Stress Concern Present (06/09/2023)   [employer] of Occupational Health - Occupational Stress Questionnaire    Feeling of Stress : Only a little  Recent Concern: Stress - Stress Concern Present (05/02/2023)   [employer] of Occupational Health - Occupational Stress Questionnaire    Feeling of Stress : Rather much  Social Connections: Socially Isolated (06/09/2023)   Social Connection and Isolation Panel [NHANES]    Frequency of Communication with Friends and Family: Once a week    Frequency of Social Gatherings with Friends and Family: Never    Attends Religious Services: Never    Database administrator or Organizations: No    Attends Engineer, structural: Never    Marital Status: Divorced    Tobacco Counseling Counseling given: No    Clinical Intake:  Pre-visit preparation completed: Yes  Pain : No/denies pain     BMI - recorded: 31.95 Nutritional Status: BMI > 30  Obese Nutritional Risks: None Diabetes: No  Lab Results  Component Value Date   HGBA1C 5.6 05/06/2023     How often do you need to have someone help you when you read instructions, pamphlets, or other written materials from your doctor or pharmacy?: 1 - Never  Interpreter Needed?: No  Information entered by :: Kandy Orris, CMA   Activities of Daily Living     06/09/2023   11:37 AM  In your present state of health, do you have any difficulty  performing the following activities:  Hearing? 0  Vision? 0  Difficulty concentrating or making decisions? 0  Walking or climbing stairs? 0  Dressing or bathing? 0  Doing errands, shopping? 0  Preparing Food and eating ? N  Using the Toilet? N  In the past six months, have you accidently leaked urine? N  Do you have problems with loss of bowel control? N  Managing your Medications? N  Managing your Finances? N  Housekeeping or managing your Housekeeping? N    Patient Care Team: Elvira Hammersmith, MD as PCP - General (Internal Medicine) Daphine Eagle, MD as Consulting Physician (Psychiatry) Lavoie, Marie-Lyne, MD as Consulting Physician (Obstetrics and Gynecology)  Indicate any recent Medical Services you may have received from other than [facility] providers in the past year (date may be approximate).     Assessment:    This is a routine wellness examination for Curtis.  Hearing/Vision screen Hearing Screening - Comments:: Denies hearing difficulties   Vision Screening - Comments:: Wears rx glasses - up to date with routine eye exams with [facility]   Goals Addressed               This Visit's Progress     Weight (lb) < 200 lb (90.7 kg) (pt-stated)   192 lb (87.1 kg)     Patient stated that she's wanting to lose about 15lbs-20lbs.       Depression Screen     06/09/2023   11:39 AM 05/06/2023    9:06 AM 11/04/2022    8:19 AM 05/05/2022    9:45 AM 11/04/2021   10:01 AM 06/13/2021    9:57 AM 06/13/2021    9:55 AM  PHQ 2/9 Scores  PHQ - 2 Score 4 4 4 2  0 0 0  PHQ- 9 Score 4 17 19 20        Fall Risk     06/09/2023   11:38 AM 05/06/2023    9:06 AM 11/04/2022    8:19 AM 05/05/2022    9:45 AM 02/10/2022    8:49 AM  Fall Risk   Falls in the past year? 0 0 0 0 0  Number falls in  past yr: 0 0 0 0 0  Injury with Fall? 0 0 0 0 0  Risk for fall due to : No Fall Risks No Fall Risks No Fall Risks No Fall Risks   Follow up Falls evaluation completed;Falls prevention  discussed Falls evaluation completed Falls evaluation completed Falls evaluation completed     MEDICARE RISK AT HOME:  Medicare Risk at Home Any stairs in or around the home?: No If so, are there any without handrails?: No Home free of loose throw rugs in walkways, pet beds, electrical cords, etc?: Yes Adequate lighting in your home to reduce risk of falls?: Yes Life alert?: No Use of a cane, walker or w/c?: No Grab bars in the bathroom?: No Shower chair or bench in shower?: No Elevated toilet seat or a handicapped toilet?: No  TIMED UP AND GO:  Was the test performed?  No  Cognitive Function: 6CIT completed        06/09/2023   11:47 AM 06/13/2021    9:44 AM 02/11/2019    2:07 PM  6CIT Screen  What Year? 0 points 0 points 0 points  What month? 0 points 0 points 0 points  What time? 0 points 0 points 0 points  Count back from 20 0 points 0 points 0 points  Months in reverse 0 points 0 points 0 points  Repeat phrase 0 points 0 points 0 points  Total Score 0 points 0 points 0 points    Immunizations Immunization History  Administered Date(s) Administered   Influenza Inj Mdck Quad Pf 11/18/2017   Influenza-Unspecified 11/18/2017, 10/24/2019, 10/28/2021, 10/28/2022   PFIZER(Purple Top)SARS-COV-2 Vaccination 03/31/2019, 04/21/2019   Pfizer Covid-19 Vaccine Bivalent Booster 51yrs & up 10/28/2022   Tdap 07/13/2021    Screening Tests Health Maintenance  Topic Date Due   Colonoscopy  Never done   Cervical Cancer Screening (HPV/Pap Cotest)  04/08/2021   MAMMOGRAM  10/11/2021   Zoster Vaccines- Shingrix (1 of 2) 08/05/2023 (Originally 11/27/2018)   COVID-19 Vaccine (4 - 2024-25 season) 01/12/2024 (Originally 12/23/2022)   INFLUENZA VACCINE  08/14/2023   Medicare Annual Wellness (AWV)  06/08/2024   DTaP/Tdap/Td (2 - Td or Tdap) 07/14/2031   Hepatitis C Screening  Completed   HIV Screening  Completed   HPV VACCINES  Aged Out   Meningococcal B Vaccine  Aged Out    Health  Maintenance  Health Maintenance Due  Topic Date Due   Colonoscopy  Never done   Cervical Cancer Screening (HPV/Pap Cotest)  04/08/2021   MAMMOGRAM  10/11/2021   Health Maintenance Items Addressed: Referral to Dr Cleone Dad (Gynecology) for a pap smear  Additional Screening:  Vision Screening: Recommended annual ophthalmology exams for early detection of glaucoma and other disorders of the eye. Pt stated she plans to schedule an appt w/[facility] for a routine eye exam in 2025.  Dental Screening: Recommended annual dental exams for proper oral hygiene  Community Resource Referral / Chronic Care Management: CRR required this visit?  No   CCM required this visit?  No   Plan:    I have personally reviewed and noted the following in the patient's chart:   Medical and social history Use of alcohol, tobacco or illicit drugs  Current medications and supplements including opioid prescriptions. Patient is not currently taking opioid prescriptions. Functional ability and status Nutritional status Physical activity Advanced directives List of other physicians Hospitalizations, surgeries, and ER visits in previous 12 months Vitals Screenings to include cognitive, depression, and falls  Referrals and appointments  In addition, I have reviewed and discussed with patient certain preventive protocols, quality metrics, and best practice recommendations. A written personalized care plan for preventive services as well as general preventive health recommendations were provided to patient.   Patria Bookbinder, CMA   06/09/2023   After Visit Summary: (MyChart) Due to this being a telephonic visit, the after visit summary with patients personalized plan was offered to patient via MyChart   Notes: Nothing significant to report at this time.

## 2023-06-09 NOTE — Patient Instructions (Addendum)
 Kristina Johnston , Thank you for taking time out of your busy schedule to complete your Annual Wellness Visit with me. I enjoyed our conversation and look forward to speaking with you again next year. I, as well as your care team,  appreciate your ongoing commitment to your health goals. Please review the following plan we discussed and let me know if I can assist you in the future. Your Game plan/ To Do List    Referrals: If you haven't heard from the office you've been referred to, please reach out to them at the phone provided.  Referral to Dr Cleone Dad (Gynecology) for a pap smear; Referral for a Mammogram; Referral to Dr Willy Harvest (Gastroenterology) for a Screening Colonoscopy Follow up Visits: Next Medicare AWV with our clinical staff: 06/09/2024   Have you seen your provider in the last 6 months (3 months if uncontrolled diabetes)? Yes Next Office Visit with your provider: 11/04/2023  Clinician Recommendations:  Aim for 30 minutes of exercise or brisk walking, 6-8 glasses of water, and 5 servings of fruits and vegetables each day. Educated and advised on getting the Shingles and COVID vaccines in 2025.      This is a list of the screening recommended for you and due dates:  Health Maintenance  Topic Date Due   Colon Cancer Screening  Never done   Pap with HPV screening  04/08/2021   Mammogram  10/11/2021   Zoster (Shingles) Vaccine (1 of 2) 08/05/2023*   COVID-19 Vaccine (4 - 2024-25 season) 01/12/2024*   Flu Shot  08/14/2023   Medicare Annual Wellness Visit  06/08/2024   DTaP/Tdap/Td vaccine (2 - Td or Tdap) 07/14/2031   Hepatitis C Screening  Completed   HIV Screening  Completed   HPV Vaccine  Aged Out   Meningitis B Vaccine  Aged Out  *Topic was postponed. The date shown is not the original due date.    Advanced directives: (In Chart) A copy of your advanced directives are scanned into your chart should your provider ever need it. Advance Care Planning is important because  it:  [x]  Makes sure you receive the medical care that is consistent with your values, goals, and preferences  [x]  It provides guidance to your family and loved ones and reduces their decisional burden about whether or not they are making the right decisions based on your wishes.  Follow the link provided in your after visit summary or read over the paperwork we have mailed to you to help you started getting your Advance Directives in place. If you need assistance in completing these, please reach out to us  so that we can help you!

## 2023-06-25 ENCOUNTER — Encounter: Payer: Self-pay | Admitting: Internal Medicine

## 2023-08-04 ENCOUNTER — Encounter: Payer: Self-pay | Admitting: Internal Medicine

## 2023-08-04 ENCOUNTER — Ambulatory Visit (AMBULATORY_SURGERY_CENTER)

## 2023-08-04 VITALS — Ht 65.0 in | Wt 195.0 lb

## 2023-08-04 DIAGNOSIS — Z1211 Encounter for screening for malignant neoplasm of colon: Secondary | ICD-10-CM

## 2023-08-04 MED ORDER — NA SULFATE-K SULFATE-MG SULF 17.5-3.13-1.6 GM/177ML PO SOLN
1.0000 | Freq: Once | ORAL | 0 refills | Status: AC
Start: 2023-08-04 — End: 2023-08-04

## 2023-08-04 NOTE — Progress Notes (Signed)
 Pt's name and DOB verified at the beginning of the pre-visit wit 2 identifiers  Pt denies any difficulty with ambulating,sitting, laying down or rolling side to side  Pt has no issues moving head neck or swallowing  No egg or soy allergy known to patient   No issues known to pt with past sedation with any surgeries or procedures  No FH of Malignant Hyperthermia  Pt is not on home 02   Pt is not on blood thinners   Pt denies issues with constipation   Pt is not on dialysis  Pt denise any abnormal heart rhythms   Pt denies any upcoming cardiac testing  Patient's chart reviewed by Norleen Schillings CNRA prior to pre-visit and patient appropriate for the [facility].  Pre-visit completed and red dot placed by patient's name on their procedure day (on provider's schedule).    Visit by phone  Pt states weight is 195 lb  IInstructions reviewed. Pt given  both [facility] main # and MD on call # prior to instructions.  Pt states understanding of instructions. Instructed pt to review instructions again prior to procedure and call main # given if has questions.. Pt states they will.   Instructed pt on where to find instructions on My Chart.

## 2023-08-10 ENCOUNTER — Telehealth: Payer: Self-pay | Admitting: Internal Medicine

## 2023-08-10 NOTE — Telephone Encounter (Signed)
 Patient needing further assistance with prep instructions.

## 2023-08-10 NOTE — Telephone Encounter (Signed)
 Returned patient call.  Clarified normal rx ok to continue, hold multi vitamin if it contains iron, other supplements ok to continue prior to colonoscopy.

## 2023-08-18 NOTE — Telephone Encounter (Signed)
 OK to take the medication

## 2023-08-18 NOTE — Telephone Encounter (Signed)
 Verified with 2 ID's Reviewed with pt that she can take her Multivitamin since she stated she looked and it does not have iron in it. Reminded pt that if it does have iron she can not take for 5 days prior to procedure. Pt was also concerned with her res/purple gummy vitamin D she takes daily. RN instructed pt to not take day before procedure but may take it after the procedure. Pt denies any other questions at this time.

## 2023-08-18 NOTE — Telephone Encounter (Signed)
 Patient is calling due to her needing clarification on medication that she is allowed to take and not allowed to take before her procedure schedule on August the 13 th. Patient is requesting a call back at [phone]. Please advise.

## 2023-08-18 NOTE — Telephone Encounter (Signed)
 Patient requesting f/u call in regards to a medication she takes for depression . States its a red liquid. Please advise.

## 2023-08-19 ENCOUNTER — Telehealth: Payer: Self-pay | Admitting: *Deleted

## 2023-08-19 NOTE — Telephone Encounter (Signed)
 Spoke with patient and advised it is ok for her to continue her liquid medication.

## 2023-08-25 NOTE — Progress Notes (Signed)
  Gastroenterology History and Physical   Primary Care Physician:  Purcell Emil Schanz, MD   Reason for Procedure:  Colon cancer screening  Plan:    Colonoscopy     HPI: Kristina Johnston is a 53 y.o. female presenting for a screening colonoscopy.   Past Medical History:  Diagnosis Date   Anemia    hx of   Anxiety    Bipolar disorder (HCC)    Depression    Hypertension    Paranoid schizophrenia (HCC)     Past Surgical History:  Procedure Laterality Date   DILATION AND CURETTAGE OF UTERUS  1995   pt had abortion and believes she had to have a D&E or D&C   HYSTERECTOMY ABDOMINAL WITH SALPINGECTOMY Bilateral 08/06/2021   Procedure: HYSTERECTOMY ABDOMINAL WITH BILATERAL SALPINGECTOMY;  Surgeon: Lavoie, Marie-Lyne, MD;  Location: [facility] OR;  Service: Gynecology;  Laterality: Bilateral;   WISDOM TOOTH EXTRACTION       Current Outpatient Medications  Medication Sig Dispense Refill   amLODipine  (NORVASC ) 10 MG tablet TAKE 1 TABLET(10 MG) BY MOUTH DAILY 90 tablet 3   Ascorbic Acid (VITAMIN C PO) Take 1,000 mg by mouth daily.     Calcium Citrate-Vitamin D 500-12.5 MG-MCG CHEW Chew by mouth.     estradiol  (CLIMARA  - DOSED IN MG/24 HR) 0.1 mg/24hr patch Place 1 patch (0.1 mg total) onto the skin once a week. (Patient not taking: Reported on 08/04/2023) 12 patch 4   Flaxseed, Linseed, (FLAXSEED OIL) 1000 MG CAPS Take 1,000 mg by mouth in the morning and at bedtime. (Patient not taking: Reported on 08/04/2023)     haloperidol (HALDOL) 2 MG/ML solution Take 4 mg by mouth at bedtime.  12   loratadine (CLARITIN) 10 MG tablet Take 10 mg by mouth daily.     Multiple Vitamin (MULTIVITAMIN PO) Take 1 tablet by mouth daily at 6 (six) AM.     Potassium 99 MG TABS Take 99 mg by mouth daily. (Patient not taking: Reported on 08/04/2023)     triamcinolone  (KENALOG ) 0.1 % APPLY TO SKIN TWICE A DAY FOR NO MORE THAN 2 WEEKS (Patient taking differently: as needed. APPLY TO SKIN TWICE A DAY FOR NO MORE  THAN 2 WEEKS) 30 g 0   Valproate Sodium (DEPAKENE ) 250 MG/5ML SOLN solution Take 1,000 mg by mouth at bedtime.     No current facility-administered medications for this visit.    Allergies as of 08/26/2023   (No Known Allergies)    Family History  Problem Relation Age of Onset   Heart disease Mother    Hyperlipidemia Mother    Mental illness Mother    Diabetes Father    Hypertension Sister    Hyperlipidemia Brother    Hypertension Brother    Cancer Maternal Aunt 40       breast cancer   Breast cancer Maternal Aunt    Cancer Maternal Grandmother    Cancer Maternal Grandfather    Hyperlipidemia Maternal Grandfather    Hypertension Maternal Grandfather    Breast cancer Maternal Grandfather    Cancer Paternal Grandmother 57       breast cancer   Hyperlipidemia Paternal Grandfather    Hypertension Paternal Grandfather    Breast cancer Cousin    Colon cancer Neg Hx    Colon polyps Neg Hx    Esophageal cancer Neg Hx    Stomach cancer Neg Hx    Rectal cancer Neg Hx     Social History   Socioeconomic History  Marital status: Divorced    Spouse name: Not on file   Number of children: 1   Years of education: Not on file   Highest education level: Bachelor's degree (e.g., BA, AB, BS)  Occupational History   Not on file  Tobacco Use   Smoking status: Never    Passive exposure: Never   Smokeless tobacco: Never  Vaping Use   Vaping status: Never Used  Substance and Sexual Activity   Alcohol use: No   Drug use: No   Sexual activity: Not Currently    Partners: Male    Birth control/protection: Surgical    Comment: 1st intercourse-19, partners- 4, hysterectomy  Other Topics Concern   Not on file  Social History Narrative   Not on file   Social Drivers of Health   Financial Resource Strain: Low Risk  (06/09/2023)   Overall Financial Resource Strain (CARDIA)    Difficulty of Paying Living Expenses: Not very hard  Recent Concern: Financial Resource Strain - Medium  Risk (05/02/2023)   Overall Financial Resource Strain (CARDIA)    Difficulty of Paying Living Expenses: Somewhat hard  Food Insecurity: Food Insecurity Present (06/09/2023)   Hunger Vital Sign    Worried About Running Out of Food in the Last Year: Sometimes true    Ran Out of Food in the Last Year: Never true  Transportation Needs: No Transportation Needs (06/09/2023)   PRAPARE - Administrator, Civil Service (Medical): No    Lack of Transportation (Non-Medical): No  Physical Activity: Insufficiently Active (06/09/2023)   Exercise Vital Sign    Days of Exercise per Week: 2 days    Minutes of Exercise per Session: 60 min  Stress: No Stress Concern Present (06/09/2023)   [employer] of Occupational Health - Occupational Stress Questionnaire    Feeling of Stress : Only a little  Recent Concern: Stress - Stress Concern Present (05/02/2023)   [employer] of Occupational Health - Occupational Stress Questionnaire    Feeling of Stress : Rather much  Social Connections: Socially Isolated (06/09/2023)   Social Connection and Isolation Panel    Frequency of Communication with Friends and Family: Once a week    Frequency of Social Gatherings with Friends and Family: Never    Attends Religious Services: Never    Database administrator or Organizations: No    Attends Banker Meetings: Never    Marital Status: Divorced  Catering manager Violence: Not At Risk (06/09/2023)   Humiliation, Afraid, Rape, and Kick questionnaire    Fear of Current or Ex-Partner: No    Emotionally Abused: No    Physically Abused: No    Sexually Abused: No    Review of Systems:  All other review of systems negative except as mentioned in the HPI.  Physical Exam: Vital signs BP (!) 145/90   Pulse (!) 103   Temp 98.1 F (36.7 C) (Temporal)   Ht 5' 5 (1.651 m)   Wt 195 lb (88.5 kg)   LMP 01/02/2021 (Exact Date)   SpO2 98%   BMI 32.45 kg/m   General:   Alert,  Well-developed,  well-nourished, pleasant and cooperative in NAD Lungs:  Clear throughout to auscultation.   Heart:  Regular rate and rhythm; no murmurs, clicks, rubs,  or gallops. Abdomen:  Soft, nontender and nondistended. Normal bowel sounds.   Neuro/Psych:  Alert and cooperative. Normal mood and affect. A and O x 3   @Kristina Johnston  Kristina Commander, MD, [facility]  [phone] (pager) 08/26/2023 8:05 AM@

## 2023-08-26 ENCOUNTER — Ambulatory Visit: Admitting: Internal Medicine

## 2023-08-26 ENCOUNTER — Encounter: Admitting: Internal Medicine

## 2023-08-26 ENCOUNTER — Encounter: Payer: Self-pay | Admitting: Internal Medicine

## 2023-08-26 VITALS — BP 113/73 | HR 86 | Temp 98.1°F | Resp 26 | Ht 65.0 in | Wt 195.0 lb

## 2023-08-26 DIAGNOSIS — K5730 Diverticulosis of large intestine without perforation or abscess without bleeding: Secondary | ICD-10-CM | POA: Diagnosis not present

## 2023-08-26 DIAGNOSIS — I10 Essential (primary) hypertension: Secondary | ICD-10-CM | POA: Diagnosis not present

## 2023-08-26 DIAGNOSIS — F200 Paranoid schizophrenia: Secondary | ICD-10-CM | POA: Diagnosis not present

## 2023-08-26 DIAGNOSIS — F419 Anxiety disorder, unspecified: Secondary | ICD-10-CM | POA: Diagnosis not present

## 2023-08-26 DIAGNOSIS — F319 Bipolar disorder, unspecified: Secondary | ICD-10-CM | POA: Diagnosis not present

## 2023-08-26 DIAGNOSIS — Z1211 Encounter for screening for malignant neoplasm of colon: Secondary | ICD-10-CM

## 2023-08-26 MED ORDER — SODIUM CHLORIDE 0.9 % IV SOLN: 500.0000 mL | Freq: Once | INTRAVENOUS | Status: DC

## 2023-08-26 NOTE — Progress Notes (Signed)
 Report given to PACU, vss

## 2023-08-26 NOTE — Progress Notes (Signed)
 Pt's states no medical or surgical changes since previsit or office visit.

## 2023-08-26 NOTE — Op Note (Signed)
 [facility] Patient Name: Kristina Johnston Procedure Date: 08/26/2023 8:30 AM MRN: [id number] Endoscopist: Lupita FORBES Commander , MD, [id number] Age: 53 Referring MD:  Date of Birth: 09/12/1970 Gender: Female Account #: [id number] Procedure:                Colonoscopy Indications:              Screening for colorectal malignant neoplasm, This                            is the patient's first colonoscopy Medicines:                Monitored Anesthesia Care Procedure:                Pre-Anesthesia Assessment:                           - Prior to the procedure, a History and Physical                            was performed, and patient medications and                            allergies were reviewed. The patient's tolerance of                            previous anesthesia was also reviewed. The risks                            and benefits of the procedure and the sedation                            options and risks were discussed with the patient.                            All questions were answered, and informed consent                            was obtained. Prior Anticoagulants: The patient has                            taken no anticoagulant or antiplatelet agents. ASA                            Grade Assessment: II - A patient with mild systemic                            disease. After reviewing the risks and benefits,                            the patient was deemed in satisfactory condition to                            undergo the procedure.  After obtaining informed consent, the colonoscope                            was passed under direct vision. Throughout the                            procedure, the patient's blood pressure, pulse, and                            oxygen saturations were monitored continuously. The                            Olympus Scope [id number] was introduced through the                            anus and advanced  to the the cecum, identified by                            appendiceal orifice and ileocecal valve. The                            colonoscopy was somewhat difficult due to                            significant looping. Successful completion of the                            procedure was aided by applying abdominal pressure.                            The patient tolerated the procedure well. The                            quality of the bowel preparation was good. The                            ileocecal valve, appendiceal orifice, and rectum                            were photographed. The bowel preparation used was                            SUPREP via split dose instruction. Scope In: 8:41:27 AM Scope Out: 8:55:51 AM Scope Withdrawal Time: 0 hours 10 minutes 53 seconds  Total Procedure Duration: 0 hours 14 minutes 24 seconds  Findings:                 The perianal and digital rectal examinations were                            normal.                           Multiple large-mouthed, medium-mouthed and  small-mouthed diverticula were found in the sigmoid                            colon, descending colon, transverse colon and                            ascending colon.                           The exam was otherwise without abnormality on                            direct and retroflexion views. Complications:            No immediate complications. Estimated Blood Loss:     Estimated blood loss: none. Impression:               - Diverticulosis in the sigmoid colon, in the                            descending colon, in the transverse colon and in                            the ascending colon.                           - The examination was otherwise normal on direct                            and retroflexion views.                           - No specimens collected. Recommendation:           - Patient has a contact number available for                             emergencies. The signs and symptoms of potential                            delayed complications were discussed with the                            patient. Return to normal activities tomorrow.                            Written discharge instructions were provided to the                            patient.                           - Resume previous diet.                           - Continue present medications.                           -  Repeat colonoscopy in 10 years for screening                            purposes. Lupita FORBES Commander, MD 08/26/2023 9:05:03 AM This report has been signed electronically.

## 2023-08-26 NOTE — Patient Instructions (Addendum)
 No polyps or cancer were found.  You do have diverticulosis which are pouches or pockets in the colon.  This is a common condition and usually does not cause problems.  Please read the handout.  I appreciate the opportunity to care for you. Kristina CHARLENA Commander, MD, FACG   YOU HAD AN ENDOSCOPIC PROCEDURE TODAY AT THE [facility]:   Refer to the procedure report that was given to you for any specific questions about what was found during the examination.  If the procedure report does not answer your questions, please call your gastroenterologist to clarify.  If you requested that your care partner not be given the details of your procedure findings, then the procedure report has been included in a sealed envelope for you to review at your convenience later.  YOU SHOULD EXPECT: Some feelings of bloating in the abdomen. Passage of more gas than usual.  Walking can help get rid of the air that was put into your GI tract during the procedure and reduce the bloating. If you had a lower endoscopy (such as a colonoscopy or flexible sigmoidoscopy) you may notice spotting of blood in your stool or on the toilet paper. If you underwent a bowel prep for your procedure, you may not have a normal bowel movement for a few days.  Please Note:  You might notice some irritation and congestion in your nose or some drainage.  This is from the oxygen used during your procedure.  There is no need for concern and it should clear up in a day or so.  SYMPTOMS TO REPORT IMMEDIATELY:  Following lower endoscopy (colonoscopy or flexible sigmoidoscopy):  Excessive amounts of blood in the stool  Significant tenderness or worsening of abdominal pains  Swelling of the abdomen that is new, acute  Fever of 100F or higher   For urgent or emergent issues, a gastroenterologist can be reached at any hour by calling [phone]. Do not use MyChart messaging for urgent concerns.    DIET:  We do recommend a small meal  at first, but then you may proceed to your regular diet.  Drink plenty of fluids but you should avoid alcoholic beverages for 24 hours.  ACTIVITY:  You should plan to take it easy for the rest of today and you should NOT DRIVE or use heavy machinery until tomorrow (because of the sedation medicines used during the test).    FOLLOW UP: Our staff will call the number listed on your records the next business day following your procedure.  We will call around 7:15- 8:00 am to check on you and address any questions or concerns that you may have regarding the information given to you following your procedure. If we do not reach you, we will leave a message.     If any biopsies were taken you will be contacted by phone or by letter within the next 1-3 weeks.  Please call us  at [phone] if you have not heard about the biopsies in 3 weeks.    SIGNATURES/CONFIDENTIALITY: You and/or your care partner have signed paperwork which will be entered into your electronic medical record.  These signatures attest to the fact that that the information above on your After Visit Summary has been reviewed and is understood.  Full responsibility of the confidentiality of this discharge information lies with you and/or your care-partner.

## 2023-08-27 ENCOUNTER — Telehealth: Payer: Self-pay | Admitting: Lactation Services

## 2023-08-27 NOTE — Telephone Encounter (Signed)
  Follow up Call-     08/26/2023    8:02 AM  Call back number  Post procedure Call Back phone  # [phone]  Permission to leave phone message Yes     Patient questions:  Do you have a fever, pain , or abdominal swelling? No. Pain Score  0 *  Have you tolerated food without any problems? Yes.    Have you been able to return to your normal activities? Yes.    Do you have any questions about your discharge instructions: Diet   No. Medications  No. Follow up visit  No.  Do you have questions or concerns about your Care? No.  Actions: * If pain score is 4 or above: No action needed, pain <4.

## 2023-11-04 ENCOUNTER — Ambulatory Visit: Payer: Self-pay | Admitting: Emergency Medicine

## 2023-11-04 ENCOUNTER — Ambulatory Visit: Admitting: Emergency Medicine

## 2023-11-04 ENCOUNTER — Encounter: Payer: Self-pay | Admitting: Emergency Medicine

## 2023-11-04 VITALS — BP 128/84 | HR 69 | Temp 98.0°F | Ht 64.75 in | Wt 202.4 lb

## 2023-11-04 DIAGNOSIS — F3176 Bipolar disorder, in full remission, most recent episode depressed: Secondary | ICD-10-CM | POA: Diagnosis not present

## 2023-11-04 DIAGNOSIS — E66811 Obesity, class 1: Secondary | ICD-10-CM | POA: Diagnosis not present

## 2023-11-04 DIAGNOSIS — E6609 Other obesity due to excess calories: Secondary | ICD-10-CM | POA: Diagnosis not present

## 2023-11-04 DIAGNOSIS — Z6833 Body mass index (BMI) 33.0-33.9, adult: Secondary | ICD-10-CM | POA: Diagnosis not present

## 2023-11-04 DIAGNOSIS — I10 Essential (primary) hypertension: Secondary | ICD-10-CM

## 2023-11-04 LAB — CBC WITH DIFFERENTIAL/PLATELET
Basophils Absolute: 0 K/uL (ref 0.0–0.1)
Basophils Relative: 1.2 % (ref 0.0–3.0)
Eosinophils Absolute: 0.1 K/uL (ref 0.0–0.7)
Eosinophils Relative: 3.1 % (ref 0.0–5.0)
HCT: 40.9 % (ref 36.0–46.0)
Hemoglobin: 13.3 g/dL (ref 12.0–15.0)
Lymphocytes Relative: 46.6 % — ABNORMAL HIGH (ref 12.0–46.0)
Lymphs Abs: 1.8 K/uL (ref 0.7–4.0)
MCHC: 32.6 g/dL (ref 30.0–36.0)
MCV: 88.5 fl (ref 78.0–100.0)
Monocytes Absolute: 0.3 K/uL (ref 0.1–1.0)
Monocytes Relative: 7.3 % (ref 3.0–12.0)
Neutro Abs: 1.6 K/uL (ref 1.4–7.7)
Neutrophils Relative %: 41.8 % — ABNORMAL LOW (ref 43.0–77.0)
Platelets: 227 K/uL (ref 150.0–400.0)
RBC: 4.63 Mil/uL (ref 3.87–5.11)
RDW: 14.3 % (ref 11.5–15.5)
WBC: 3.8 K/uL — ABNORMAL LOW (ref 4.0–10.5)

## 2023-11-04 LAB — COMPREHENSIVE METABOLIC PANEL WITH GFR
ALT: 28 U/L (ref 0–35)
AST: 22 U/L (ref 0–37)
Albumin: 5.1 g/dL (ref 3.5–5.2)
Alkaline Phosphatase: 78 U/L (ref 39–117)
BUN: 12 mg/dL (ref 6–23)
CO2: 28 meq/L (ref 19–32)
Calcium: 9.8 mg/dL (ref 8.4–10.5)
Chloride: 104 meq/L (ref 96–112)
Creatinine, Ser: 0.72 mg/dL (ref 0.40–1.20)
GFR: 94.45 mL/min (ref >60.00)
Glucose, Bld: 93 mg/dL (ref 70–99)
Potassium: 4 meq/L (ref 3.5–5.1)
Sodium: 142 meq/L (ref 135–145)
Total Bilirubin: 0.4 mg/dL (ref 0.2–1.2)
Total Protein: 8.1 g/dL (ref 6.0–8.3)

## 2023-11-04 LAB — TSH: TSH: 2.89 u[IU]/mL (ref 0.35–5.50)

## 2023-11-04 LAB — HEMOGLOBIN A1C: Hgb A1c MFr Bld: 5.7 % (ref 4.6–6.5)

## 2023-11-04 NOTE — Assessment & Plan Note (Signed)
 Wt Readings from Last 3 Encounters:  11/04/23 202 lb 6.4 oz (91.8 kg)  08/26/23 195 lb (88.5 kg)  08/04/23 195 lb (88.5 kg)  Benefits of exercise discussed Advised to decrease amount of daily carbohydrate intake and daily calories and increase amount of plant-based protein in her diet.

## 2023-11-04 NOTE — Progress Notes (Signed)
 Kristina Johnston 53 y.o.   Chief Complaint  Patient presents with   Follow-up    Patient has no questions to discuss. Patient needs general blood work     HISTORY OF PRESENT ILLNESS: This is a 53 y.o. female here for 54-month follow-up of hypertension Overall doing well. Has no complaints or medical concerns today.  HPI   Prior to Admission medications   Medication Sig Start Date End Date Taking? Authorizing Provider  amLODipine  (NORVASC ) 10 MG tablet TAKE 1 TABLET(10 MG) BY MOUTH DAILY 06/01/23  Yes Blossie Raffel, Emil Schanz, MD  Ascorbic Acid (VITAMIN C PO) Take 1,000 mg by mouth daily.   Yes [provider]  Calcium Citrate-Vitamin D 500-12.5 MG-MCG CHEW Chew by mouth.   Yes [provider]  haloperidol (HALDOL) 2 MG/ML solution Take 4 mg by mouth at bedtime. 08/25/17  Yes [provider]  loratadine (CLARITIN) 10 MG tablet Take 10 mg by mouth daily.   Yes [provider]  Multiple Vitamin (MULTIVITAMIN PO) Take 1 tablet by mouth daily at 6 (six) AM.   Yes [provider]  triamcinolone  (KENALOG ) 0.1 % APPLY TO SKIN TWICE A DAY FOR NO MORE THAN 2 WEEKS Patient taking differently: as needed. APPLY TO SKIN TWICE A DAY FOR NO MORE THAN 2 WEEKS 11/29/19  Yes Amantha Sklar, Emil Schanz, MD  Valproate Sodium (DEPAKENE ) 250 MG/5ML SOLN solution Take 1,000 mg by mouth at bedtime.   Yes [provider]  estradiol  (CLIMARA  - DOSED IN MG/24 HR) 0.1 mg/24hr patch Place 1 patch (0.1 mg total) onto the skin once a week. Patient not taking: Reported on 08/04/2023 02/10/22   Lavoie, Marie-Lyne, MD  Flaxseed, Linseed, (FLAXSEED OIL) 1000 MG CAPS Take 1,000 mg by mouth in the morning and at bedtime. Patient not taking: Reported on 08/04/2023    [provider]  Potassium 99 MG TABS Take 99 mg by mouth daily. Patient not taking: Reported on 08/04/2023    [provider]    No Known Allergies  Patient Active Problem List   Diagnosis Date  Noted   Chronic insomnia 05/05/2022   Class 1 obesity due to excess calories without serious comorbidity with body mass index (BMI) of 33.0 to 33.9 in adult 05/05/2022   Uterine fibroid 08/06/2021   Fibroid tumor 04/23/2020   Essential hypertension 06/06/2017   Bipolar disorder (HCC) 04/08/2017   Depression 03/17/2011    Past Medical History:  Diagnosis Date   Anemia    hx of   Anxiety    Bipolar disorder (HCC)    Depression    Hypertension    Paranoid schizophrenia (HCC)     Past Surgical History:  Procedure Laterality Date   DILATION AND CURETTAGE OF UTERUS  1995   pt had abortion and believes she had to have a D&E or D&C   HYSTERECTOMY ABDOMINAL WITH SALPINGECTOMY Bilateral 08/06/2021   Procedure: HYSTERECTOMY ABDOMINAL WITH BILATERAL SALPINGECTOMY;  Surgeon: Lavoie, Marie-Lyne, MD;  Location: [facility] OR;  Service: Gynecology;  Laterality: Bilateral;   WISDOM TOOTH EXTRACTION      Social History   Socioeconomic History   Marital status: Divorced    Spouse name: Not on file   Number of children: 1   Years of education: Not on file   Highest education level: Bachelor's degree (e.g., BA, AB, BS)  Occupational History   Not on file  Tobacco Use   Smoking status: Never    Passive exposure: Never   Smokeless tobacco: Never  Vaping Use   Vaping status: Never Used  Substance and Sexual Activity   Alcohol use: No   Drug use: No   Sexual activity: Not Currently    Partners: Male    Birth control/protection: Surgical    Comment: 1st intercourse-19, partners- 4, hysterectomy  Other Topics Concern   Not on file  Social History Narrative   Not on file   Social Drivers of Health   Financial Resource Strain: Medium Risk (10/31/2023)   Overall Financial Resource Strain (CARDIA)    Difficulty of Paying Living Expenses: Somewhat hard  Food Insecurity: Food Insecurity Present (10/31/2023)   Hunger Vital Sign    Worried About Running Out of Food in the Last Year: Often true     Ran Out of Food in the Last Year: Often true  Transportation Needs: No Transportation Needs (10/31/2023)   PRAPARE - Administrator, Civil Service (Medical): No    Lack of Transportation (Non-Medical): No  Physical Activity: Insufficiently Active (10/31/2023)   Exercise Vital Sign    Days of Exercise per Week: 3 days    Minutes of Exercise per Session: 30 min  Stress: Stress Concern Present (10/31/2023)   [employer] of Occupational Health - Occupational Stress Questionnaire    Feeling of Stress: Very much  Social Connections: Socially Isolated (10/31/2023)   Social Connection and Isolation Panel    Frequency of Communication with Friends and Family: Once a week    Frequency of Social Gatherings with Friends and Family: Never    Attends Religious Services: Never    Database administrator or Organizations: No    Attends Engineer, structural: Not on file    Marital Status: Divorced  Intimate Partner Violence: Not At Risk (06/09/2023)   Humiliation, Afraid, Rape, and Kick questionnaire    Fear of Current or Ex-Partner: No    Emotionally Abused: No    Physically Abused: No    Sexually Abused: No    Family History  Problem Relation Age of Onset   Heart disease Mother    Hyperlipidemia Mother    Mental illness Mother    Diabetes Father    Hypertension Sister    Hyperlipidemia Brother    Hypertension Brother    Cancer Maternal Aunt 40       breast cancer   Breast cancer Maternal Aunt    Cancer Maternal Grandmother    Cancer Maternal Grandfather    Hyperlipidemia Maternal Grandfather    Hypertension Maternal Grandfather    Breast cancer Maternal Grandfather    Cancer Paternal Grandmother 37       breast cancer   Hyperlipidemia Paternal Grandfather    Hypertension Paternal Grandfather    Breast cancer Cousin    Colon cancer Neg Hx    Colon polyps Neg Hx    Esophageal cancer Neg Hx    Stomach cancer Neg Hx    Rectal cancer Neg Hx       Review of Systems  Constitutional: Negative.  Negative for chills and fever.  HENT: Negative.  Negative for congestion and sore throat.   Respiratory: Negative.  Negative for cough and shortness of breath.   Cardiovascular: Negative.  Negative for chest pain and palpitations.  Gastrointestinal:  Negative for abdominal pain, diarrhea, nausea and vomiting.  Genitourinary: Negative.  Negative for dysuria and hematuria.  Skin: Negative.  Negative for rash.  Neurological: Negative.  Negative for dizziness and headaches.  All other systems reviewed and are negative.  Vitals:   11/04/23 0930  BP: 128/84  Pulse: 69  Temp: 98 F (36.7 C)  SpO2: 96%    Physical Exam Vitals reviewed.  Constitutional:      Appearance: Normal appearance.  HENT:     Head: Normocephalic.     Mouth/Throat:     Mouth: Mucous membranes are moist.     Pharynx: Oropharynx is clear.  Eyes:     Extraocular Movements: Extraocular movements intact.     Pupils: Pupils are equal, round, and reactive to light.  Cardiovascular:     Rate and Rhythm: Normal rate and regular rhythm.     Pulses: Normal pulses.     Heart sounds: Normal heart sounds.  Pulmonary:     Effort: Pulmonary effort is normal.     Breath sounds: Normal breath sounds.  Musculoskeletal:     Cervical back: No tenderness.  Lymphadenopathy:     Cervical: No cervical adenopathy.  Skin:    General: Skin is warm and dry.     Capillary Refill: Capillary refill takes less than 2 seconds.  Neurological:     General: No focal deficit present.     Mental Status: She is alert and oriented to person, place, and time.  Psychiatric:        Mood and Affect: Mood normal.        Behavior: Behavior normal.      ASSESSMENT & PLAN: A total of 42 minutes was spent with the patient and counseling/coordination of care regarding preparing for this visit, review of most recent office visit notes, review of multiple chronic medical conditions and their  management, cardiovascular risks associated with hypertension, review of all medications, review of most recent bloodwork results, review of health maintenance items, education on nutrition, prognosis, documentation, and need for follow up.    Problem List Items Addressed This Visit       Cardiovascular and Mediastinum   Essential hypertension - Primary   BP Readings from Last 3 Encounters:  11/04/23 128/84  08/26/23 113/73  05/06/23 124/82   Well-controlled hypertension Continue amlodipine  10 mg daily Cardiovascular risks associated with hypertension discussed Diet and nutrition discussed Benefits of exercise discussed Blood work done today Follow-up in 6 months        Relevant Orders   CBC w/Diff   Comprehensive metabolic panel with GFR   Hemoglobin A1c   Lipid panel   TSH     Other   Bipolar disorder (HCC)   Clinically stable. See a psychiatrist on a regular basis Medications handled by psychiatrist office      Relevant Orders   Valproic Acid  level   Class 1 obesity due to excess calories without serious comorbidity with body mass index (BMI) of 33.0 to 33.9 in adult   Wt Readings from Last 3 Encounters:  11/04/23 202 lb 6.4 oz (91.8 kg)  08/26/23 195 lb (88.5 kg)  08/04/23 195 lb (88.5 kg)  Benefits of exercise discussed Advised to decrease amount of daily carbohydrate intake and daily calories and increase amount of plant-based protein in her diet.       Patient Instructions  Hypertension, Adult High blood pressure (hypertension) is when the force of blood pumping through the arteries is too strong. The arteries are the blood vessels that carry blood from the heart throughout the body. Hypertension forces the heart to work harder to pump blood and may cause arteries to become narrow or stiff. Untreated or uncontrolled hypertension can lead to a heart attack, heart  failure, a stroke, kidney disease, and other problems. A blood pressure reading consists of a  higher number over a lower number. Ideally, your blood pressure should be below 120/80. The first (top) number is called the systolic pressure. It is a measure of the pressure in your arteries as your heart beats. The second (bottom) number is called the diastolic pressure. It is a measure of the pressure in your arteries as the heart relaxes. What are the causes? The exact cause of this condition is not known. There are some conditions that result in high blood pressure. What increases the risk? Certain factors may make you more likely to develop high blood pressure. Some of these risk factors are under your control, including: Smoking. Not getting enough exercise or physical activity. Being overweight. Having too much fat, sugar, calories, or salt (sodium) in your diet. Drinking too much alcohol. Other risk factors include: Having a personal history of heart disease, diabetes, high cholesterol, or kidney disease. Stress. Having a family history of high blood pressure and high cholesterol. Having obstructive sleep apnea. Age. The risk increases with age. What are the signs or symptoms? High blood pressure may not cause symptoms. Very high blood pressure (hypertensive crisis) may cause: Headache. Fast or irregular heartbeats (palpitations). Shortness of breath. Nosebleed. Nausea and vomiting. Vision changes. Severe chest pain, dizziness, and seizures. How is this diagnosed? This condition is diagnosed by measuring your blood pressure while you are seated, with your arm resting on a flat surface, your legs uncrossed, and your feet flat on the floor. The cuff of the blood pressure monitor will be placed directly against the skin of your upper arm at the level of your heart. Blood pressure should be measured at least twice using the same arm. Certain conditions can cause a difference in blood pressure between your right and left arms. If you have a high blood pressure reading during one  visit or you have normal blood pressure with other risk factors, you may be asked to: Return on a different day to have your blood pressure checked again. Monitor your blood pressure at home for 1 week or longer. If you are diagnosed with hypertension, you may have other blood or imaging tests to help your health care provider understand your overall risk for other conditions. How is this treated? This condition is treated by making healthy lifestyle changes, such as eating healthy foods, exercising more, and reducing your alcohol intake. You may be referred for counseling on a healthy diet and physical activity. Your health care provider may prescribe medicine if lifestyle changes are not enough to get your blood pressure under control and if: Your systolic blood pressure is above 130. Your diastolic blood pressure is above 80. Your personal target blood pressure may vary depending on your medical conditions, your age, and other factors. Follow these instructions at home: Eating and drinking  Eat a diet that is high in fiber and potassium, and low in sodium, added sugar, and fat. An example of this eating plan is called the DASH diet. DASH stands for Dietary Approaches to Stop Hypertension. To eat this way: Eat plenty of fresh fruits and vegetables. Try to fill one half of your plate at each meal with fruits and vegetables. Eat whole grains, such as whole-wheat pasta, brown rice, or whole-grain bread. Fill about one fourth of your plate with whole grains. Eat or drink low-fat dairy products, such as skim milk or low-fat yogurt. Avoid fatty cuts of meat, processed or  cured meats, and poultry with skin. Fill about one fourth of your plate with lean proteins, such as fish, chicken without skin, beans, eggs, or tofu. Avoid pre-made and processed foods. These tend to be higher in sodium, added sugar, and fat. Reduce your daily sodium intake. Many people with hypertension should eat less than 1,500 mg  of sodium a day. Do not drink alcohol if: Your health care provider tells you not to drink. You are pregnant, may be pregnant, or are planning to become pregnant. If you drink alcohol: Limit how much you have to: 0-1 drink a day for women. 0-2 drinks a day for men. Know how much alcohol is in your drink. In the [place] one drink equals one 12 oz bottle of beer (355 mL), one 5 oz glass of wine (148 mL), or one 1 oz glass of hard liquor (44 mL). Lifestyle  Work with your health care provider to maintain a healthy body weight or to lose weight. Ask what an ideal weight is for you. Get at least 30 minutes of exercise that causes your heart to beat faster (aerobic exercise) most days of the week. Activities may include walking, swimming, or biking. Include exercise to strengthen your muscles (resistance exercise), such as Pilates or lifting weights, as part of your weekly exercise routine. Try to do these types of exercises for 30 minutes at least 3 days a week. Do not use any products that contain nicotine or tobacco. These products include cigarettes, chewing tobacco, and vaping devices, such as e-cigarettes. If you need help quitting, ask your health care provider. Monitor your blood pressure at home as told by your health care provider. Keep all follow-up visits. This is important. Medicines Take over-the-counter and prescription medicines only as told by your health care provider. Follow directions carefully. Blood pressure medicines must be taken as prescribed. Do not skip doses of blood pressure medicine. Doing this puts you at risk for problems and can make the medicine less effective. Ask your health care provider about side effects or reactions to medicines that you should watch for. Contact a health care provider if you: Think you are having a reaction to a medicine you are taking. Have headaches that keep coming back (recurring). Feel dizzy. Have swelling in your ankles. Have trouble  with your vision. Get help right away if you: Develop a severe headache or confusion. Have unusual weakness or numbness. Feel faint. Have severe pain in your chest or abdomen. Vomit repeatedly. Have trouble breathing. These symptoms may be an emergency. Get help right away. Call 911. Do not wait to see if the symptoms will go away. Do not drive yourself to the hospital. Summary Hypertension is when the force of blood pumping through your arteries is too strong. If this condition is not controlled, it may put you at risk for serious complications. Your personal target blood pressure may vary depending on your medical conditions, your age, and other factors. For most people, a normal blood pressure is less than 120/80. Hypertension is treated with lifestyle changes, medicines, or a combination of both. Lifestyle changes include losing weight, eating a healthy, low-sodium diet, exercising more, and limiting alcohol. This information is not intended to replace advice given to you by your health care provider. Make sure you discuss any questions you have with your health care provider. Document Revised: 11/06/2020 Document Reviewed: 11/06/2020 Elsevier Patient Education  2024 [employer]   Emil Schaumann, MD [facility]

## 2023-11-04 NOTE — Assessment & Plan Note (Signed)
 BP Readings from Last 3 Encounters:  11/04/23 128/84  08/26/23 113/73  05/06/23 124/82   Well-controlled hypertension Continue amlodipine  10 mg daily Cardiovascular risks associated with hypertension discussed Diet and nutrition discussed Benefits of exercise discussed Blood work done today Follow-up in 6 months

## 2023-11-04 NOTE — Patient Instructions (Signed)
 Hypertension, Adult High blood pressure (hypertension) is when the force of blood pumping through the arteries is too strong. The arteries are the blood vessels that carry blood from the heart throughout the body. Hypertension forces the heart to work harder to pump blood and may cause arteries to become narrow or stiff. Untreated or uncontrolled hypertension can lead to a heart attack, heart failure, a stroke, kidney disease, and other problems. A blood pressure reading consists of a higher number over a lower number. Ideally, your blood pressure should be below 120/80. The first ("top") number is called the systolic pressure. It is a measure of the pressure in your arteries as your heart beats. The second ("bottom") number is called the diastolic pressure. It is a measure of the pressure in your arteries as the heart relaxes. What are the causes? The exact cause of this condition is not known. There are some conditions that result in high blood pressure. What increases the risk? Certain factors may make you more likely to develop high blood pressure. Some of these risk factors are under your control, including: Smoking. Not getting enough exercise or physical activity. Being overweight. Having too much fat, sugar, calories, or salt (sodium) in your diet. Drinking too much alcohol. Other risk factors include: Having a personal history of heart disease, diabetes, high cholesterol, or kidney disease. Stress. Having a family history of high blood pressure and high cholesterol. Having obstructive sleep apnea. Age. The risk increases with age. What are the signs or symptoms? High blood pressure may not cause symptoms. Very high blood pressure (hypertensive crisis) may cause: Headache. Fast or irregular heartbeats (palpitations). Shortness of breath. Nosebleed. Nausea and vomiting. Vision changes. Severe chest pain, dizziness, and seizures. How is this diagnosed? This condition is diagnosed by  measuring your blood pressure while you are seated, with your arm resting on a flat surface, your legs uncrossed, and your feet flat on the floor. The cuff of the blood pressure monitor will be placed directly against the skin of your upper arm at the level of your heart. Blood pressure should be measured at least twice using the same arm. Certain conditions can cause a difference in blood pressure between your right and left arms. If you have a high blood pressure reading during one visit or you have normal blood pressure with other risk factors, you may be asked to: Return on a different day to have your blood pressure checked again. Monitor your blood pressure at home for 1 week or longer. If you are diagnosed with hypertension, you may have other blood or imaging tests to help your health care provider understand your overall risk for other conditions. How is this treated? This condition is treated by making healthy lifestyle changes, such as eating healthy foods, exercising more, and reducing your alcohol intake. You may be referred for counseling on a healthy diet and physical activity. Your health care provider may prescribe medicine if lifestyle changes are not enough to get your blood pressure under control and if: Your systolic blood pressure is above 130. Your diastolic blood pressure is above 80. Your personal target blood pressure may vary depending on your medical conditions, your age, and other factors. Follow these instructions at home: Eating and drinking  Eat a diet that is high in fiber and potassium, and low in sodium, added sugar, and fat. An example of this eating plan is called the DASH diet. DASH stands for Dietary Approaches to Stop Hypertension. To eat this way: Eat  plenty of fresh fruits and vegetables. Try to fill one half of your plate at each meal with fruits and vegetables. Eat whole grains, such as whole-wheat pasta, brown rice, or whole-grain bread. Fill about one  fourth of your plate with whole grains. Eat or drink low-fat dairy products, such as skim milk or low-fat yogurt. Avoid fatty cuts of meat, processed or cured meats, and poultry with skin. Fill about one fourth of your plate with lean proteins, such as fish, chicken without skin, beans, eggs, or tofu. Avoid pre-made and processed foods. These tend to be higher in sodium, added sugar, and fat. Reduce your daily sodium intake. Many people with hypertension should eat less than 1,500 mg of sodium a day. Do not drink alcohol if: Your health care provider tells you not to drink. You are pregnant, may be pregnant, or are planning to become pregnant. If you drink alcohol: Limit how much you have to: 0-1 drink a day for women. 0-2 drinks a day for men. Know how much alcohol is in your drink. In the [place] one drink equals one 12 oz bottle of beer (355 mL), one 5 oz glass of wine (148 mL), or one 1 oz glass of hard liquor (44 mL). Lifestyle  Work with your health care provider to maintain a healthy body weight or to lose weight. Ask what an ideal weight is for you. Get at least 30 minutes of exercise that causes your heart to beat faster (aerobic exercise) most days of the week. Activities may include walking, swimming, or biking. Include exercise to strengthen your muscles (resistance exercise), such as Pilates or lifting weights, as part of your weekly exercise routine. Try to do these types of exercises for 30 minutes at least 3 days a week. Do not use any products that contain nicotine or tobacco. These products include cigarettes, chewing tobacco, and vaping devices, such as e-cigarettes. If you need help quitting, ask your health care provider. Monitor your blood pressure at home as told by your health care provider. Keep all follow-up visits. This is important. Medicines Take over-the-counter and prescription medicines only as told by your health care provider. Follow directions carefully. Blood  pressure medicines must be taken as prescribed. Do not skip doses of blood pressure medicine. Doing this puts you at risk for problems and can make the medicine less effective. Ask your health care provider about side effects or reactions to medicines that you should watch for. Contact a health care provider if you: Think you are having a reaction to a medicine you are taking. Have headaches that keep coming back (recurring). Feel dizzy. Have swelling in your ankles. Have trouble with your vision. Get help right away if you: Develop a severe headache or confusion. Have unusual weakness or numbness. Feel faint. Have severe pain in your chest or abdomen. Vomit repeatedly. Have trouble breathing. These symptoms may be an emergency. Get help right away. Call 911. Do not wait to see if the symptoms will go away. Do not drive yourself to the hospital. Summary Hypertension is when the force of blood pumping through your arteries is too strong. If this condition is not controlled, it may put you at risk for serious complications. Your personal target blood pressure may vary depending on your medical conditions, your age, and other factors. For most people, a normal blood pressure is less than 120/80. Hypertension is treated with lifestyle changes, medicines, or a combination of both. Lifestyle changes include losing weight, eating a healthy,  low-sodium diet, exercising more, and limiting alcohol. This information is not intended to replace advice given to you by your health care provider. Make sure you discuss any questions you have with your health care provider. Document Revised: 11/06/2020 Document Reviewed: 11/06/2020 [employer] Patient Education  2024 [employer].

## 2023-11-04 NOTE — Assessment & Plan Note (Signed)
Clinically stable. See a psychiatrist on a regular basis Medications handled by psychiatrist office

## 2023-11-05 ENCOUNTER — Telehealth: Payer: Self-pay

## 2023-11-05 LAB — LIPID PANEL
Cholesterol: 180 mg/dL (ref <200)
HDL: 66 mg/dL (ref >=50)
LDL Cholesterol (Calc): 90 mg/dL
Non-HDL Cholesterol (Calc): 114 mg/dL (ref <130)
Total CHOL/HDL Ratio: 2.7 (calc) (ref <5.0)
Triglycerides: 140 mg/dL (ref <150)

## 2023-11-05 LAB — VALPROIC ACID LEVEL: Valproic Acid Lvl: 95.3 mg/L (ref 50.0–100.0)

## 2023-11-05 NOTE — Telephone Encounter (Signed)
 Patient signed ROI 11-04-2023. ROI good for 1 year (11-03-2024) after this new one will be needed to be signed.

## 2024-05-05 ENCOUNTER — Ambulatory Visit: Admitting: Emergency Medicine

## 2024-06-09 ENCOUNTER — Ambulatory Visit
